# Patient Record
Sex: Male | Born: 1980
Health system: Southern US, Community
[De-identification: ages and names within clinical notes are randomized; demographics above are authoritative.]

## PROBLEM LIST (undated history)

## (undated) DIAGNOSIS — K219 Gastro-esophageal reflux disease without esophagitis: Secondary | ICD-10-CM

## (undated) DIAGNOSIS — Z9889 Other specified postprocedural states: Secondary | ICD-10-CM

## (undated) DIAGNOSIS — F419 Anxiety disorder, unspecified: Secondary | ICD-10-CM

## (undated) DIAGNOSIS — R112 Nausea with vomiting, unspecified: Secondary | ICD-10-CM

## (undated) HISTORY — PX: ANTERIOR CRUCIATE LIGAMENT REPAIR: SHX115

## (undated) HISTORY — PX: MENISCUS REPAIR: SHX5179

## (undated) HISTORY — DX: Anxiety disorder, unspecified: F41.9

## (undated) HISTORY — PX: ESOPHAGOGASTRODUODENOSCOPY: SHX1529

## (undated) HISTORY — PX: HERNIA REPAIR: SHX51

---

## 2008-07-02 ENCOUNTER — Encounter: Admission: RE | Admit: 2008-07-02 | Discharge: 2008-07-02 | Payer: Self-pay | Admitting: Orthopaedic Surgery

## 2008-11-22 ENCOUNTER — Encounter: Admission: RE | Admit: 2008-11-22 | Discharge: 2008-11-22 | Payer: Self-pay | Admitting: Unknown Physician Specialty

## 2009-09-23 ENCOUNTER — Encounter: Admission: RE | Admit: 2009-09-23 | Discharge: 2009-09-23 | Payer: Self-pay | Admitting: Unknown Physician Specialty

## 2013-01-12 ENCOUNTER — Other Ambulatory Visit: Payer: Self-pay | Admitting: Unknown Physician Specialty

## 2013-01-12 ENCOUNTER — Ambulatory Visit
Admission: RE | Admit: 2013-01-12 | Discharge: 2013-01-12 | Disposition: A | Discharge status: Home / Self Care | Payer: 59 | Source: Ambulatory Visit | Attending: Unknown Physician Specialty | Admitting: Unknown Physician Specialty

## 2013-01-12 DIAGNOSIS — M79671 Pain in right foot: Secondary | ICD-10-CM

## 2013-01-16 ENCOUNTER — Telehealth: Payer: Self-pay

## 2013-01-16 NOTE — Telephone Encounter (Signed)
Zollie Scale wants to know how long the patient should be in the boot. Saki Legore,CMA

## 2013-01-16 NOTE — Telephone Encounter (Signed)
I looked at the x-rays myself, probably 2 weeks in the cast boot, keep it wrapped with an Ace bandage, ice 20 minutes 3 or 4 times per day, when he comes out of the boot a simple lace up ankle brace should be sufficient, he should come out of the boot every night to trace out the alphabet with his toes to maintain range of motion and prevent atrophy. This will likely take 4 weeks to heal, as small as the avulsion fracture is, this can be treated like a severe sprain.

## 2013-01-19 ENCOUNTER — Encounter: Payer: Self-pay | Admitting: Sports Medicine

## 2013-01-19 ENCOUNTER — Ambulatory Visit (INDEPENDENT_AMBULATORY_CARE_PROVIDER_SITE_OTHER): Payer: 59 | Admitting: Sports Medicine

## 2013-01-19 VITALS — BP 113/52 | HR 78 | Wt 195.0 lb

## 2013-01-19 DIAGNOSIS — M79671 Pain in right foot: Secondary | ICD-10-CM | POA: Insufficient documentation

## 2013-01-19 DIAGNOSIS — M79609 Pain in unspecified limb: Secondary | ICD-10-CM

## 2013-01-19 NOTE — Assessment & Plan Note (Signed)
Localized under the arch, present for 3 weeks after stepping on a stone while hiking. I cannot isolate the pain to the flexor hallucis longus or flexor digitorum longus tendons. I have recommended he do Celebrex twice a day, I'd like to see him back for custom orthotics for additional support. Then we will touch base in one month and we can consider an MRI if he is continuing to have pain.

## 2013-01-19 NOTE — Progress Notes (Signed)
   Subjective:    I'm seeing this patient as a consultation for:  Dr. Harl Bowie.  CC: Right foot pain  HPI: This is a very pleasant 32 year old male, he is the husband of Joshua Dicker, FNP and son of Dr. Harl Bowie.  Approximately 3 weeks ago he was hiking, stepped on a stone that caused significant pain but no swelling or bruising just under his arch. Since then it improved significantly, but unfortunately he continues to have pain he localizes on the plantar aspect of the navicular, and the plantar fascia. Pain is moderate, persistent. No radiation. He denies any bruising, pain is only present after a long day on his feet. He did have x-rays done that will be dictated below. He has been taking 200 mg of Celebrex which is only moderately effective. He does have a challenge coming up, but is thinking about skipping it and doing the next one due to his pain.  Past medical history, Surgical history, Family history not pertinant except as noted below, Social history, Allergies, and medications have been entered into the medical record, reviewed, and no changes needed.   Review of Systems: No headache, visual changes, nausea, vomiting, diarrhea, constipation, dizziness, abdominal pain, skin rash, fevers, chills, night sweats, weight loss, swollen lymph nodes, body aches, joint swelling, muscle aches, chest pain, shortness of breath, mood changes, visual or auditory hallucinations.   Objective:   General: Well Developed, well nourished, and in no acute distress.  Neuro/Psych: Alert and oriented x3, extra-ocular muscles intact, able to move all 4 extremities, sensation grossly intact. Skin: Warm and dry, no rashes noted.  Respiratory: Not using accessory muscles, speaking in full sentences, trachea midline.  Cardiovascular: Pulses palpable, no extremity edema. Abdomen: Does not appear distended. Right Foot: No visible erythema or swelling. Range of motion is full in all directions. Strength is 5/5  in all directions. No hallux valgus. No pes cavus or pes planus. No abnormal callus noted. No pain over the navicular prominence, or base of fifth metatarsal. No tenderness to palpation of the calcaneal insertion of plantar fascia. No pain at the Achilles insertion. No pain over the calcaneal bursa. No pain of the retrocalcaneal bursa. No tenderness to palpation over the tarsals, metatarsals, or phalanges. No hallux rigidus or limitus. No tenderness palpation over interphalangeal joints. No pain with compression of the metatarsal heads. Neurovascularly intact distally. 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  X-rays were reviewed personally, there is a small avulsion from the dorsal talus, clinically he has no tenderness in this location and this is likely old and insignificant.  Impression and Recommendations:   This case required medical decision making of moderate complexity.

## 2013-01-22 ENCOUNTER — Encounter: Payer: Self-pay | Admitting: Sports Medicine

## 2013-01-22 ENCOUNTER — Ambulatory Visit (INDEPENDENT_AMBULATORY_CARE_PROVIDER_SITE_OTHER): Payer: 59 | Admitting: Sports Medicine

## 2013-01-22 VITALS — BP 133/74 | HR 54 | Wt 198.0 lb

## 2013-01-22 DIAGNOSIS — M79671 Pain in right foot: Secondary | ICD-10-CM

## 2013-01-22 DIAGNOSIS — M79609 Pain in unspecified limb: Secondary | ICD-10-CM

## 2013-01-22 NOTE — Progress Notes (Signed)
    Patient was fitted for a : standard, cushioned, semi-rigid orthotic. The orthotic was heated and afterward the patient stood on the orthotic blank positioned on the orthotic stand. The patient was positioned in subtalar neutral position and 10 degrees of ankle dorsiflexion in a weight bearing stance. After completion of molding, a stable base was applied to the orthotic blank. The blank was ground to a stable position for weight bearing. Size: 12 Base: Blue EVA Additional Posting and Padding: None The patient ambulated these, and they were very comfortable.  I spent 40 minutes with this patient, greater than 50% was face-to-face time counseling regarding the below diagnosis.   

## 2013-01-22 NOTE — Assessment & Plan Note (Signed)
Orthotics as above. Return in 3 weeks. He may desire a second pair, and can come see me as needed for that.

## 2014-05-20 ENCOUNTER — Ambulatory Visit
Admission: RE | Admit: 2014-05-20 | Discharge: 2014-05-20 | Disposition: A | Discharge status: Home / Self Care | Payer: 59 | Source: Ambulatory Visit | Attending: Family Medicine | Admitting: Family Medicine

## 2014-05-20 ENCOUNTER — Other Ambulatory Visit: Payer: Self-pay | Admitting: Family Medicine

## 2014-05-20 DIAGNOSIS — M7989 Other specified soft tissue disorders: Secondary | ICD-10-CM

## 2015-02-27 ENCOUNTER — Encounter: Payer: Self-pay | Admitting: Sports Medicine

## 2015-02-27 ENCOUNTER — Ambulatory Visit (INDEPENDENT_AMBULATORY_CARE_PROVIDER_SITE_OTHER): Payer: 59 | Admitting: Sports Medicine

## 2015-02-27 ENCOUNTER — Ambulatory Visit (INDEPENDENT_AMBULATORY_CARE_PROVIDER_SITE_OTHER): Payer: 59

## 2015-02-27 VITALS — BP 127/54 | HR 60 | Ht 68.0 in | Wt 185.0 lb

## 2015-02-27 DIAGNOSIS — M25562 Pain in left knee: Secondary | ICD-10-CM | POA: Diagnosis not present

## 2015-02-27 DIAGNOSIS — M25561 Pain in right knee: Secondary | ICD-10-CM | POA: Diagnosis not present

## 2015-02-27 DIAGNOSIS — Z9889 Other specified postprocedural states: Secondary | ICD-10-CM | POA: Diagnosis not present

## 2015-02-27 DIAGNOSIS — S83206A Unspecified tear of unspecified meniscus, current injury, right knee, initial encounter: Secondary | ICD-10-CM | POA: Insufficient documentation

## 2015-02-27 DIAGNOSIS — M174 Other bilateral secondary osteoarthritis of knee: Secondary | ICD-10-CM | POA: Insufficient documentation

## 2015-02-27 NOTE — Progress Notes (Signed)
  Subjective:    CC: Locked left knee  HPI: This is a pleasant 34 year male with a history of a anterior cruciate ligament reconstruction, and multiple meniscectomies as well as meniscal repairs, particularly on the left side. Recently he was playing basketball, and felt a catch and had immediate pain and swelling in his left knee. His knee then locked, losing approximately 10 of extension. Exquisite pain with attempts to bring it past 10.  Past medical history, Surgical history, Family history not pertinant except as noted below, Social history, Allergies, and medications have been entered into the medical record, reviewed, and no changes needed.   Review of Systems: No fevers, chills, night sweats, weight loss, chest pain, or shortness of breath.   Objective:    General: Well Developed, well nourished, and in no acute distress.  Neuro: Alert and oriented x3, extra-ocular muscles intact, sensation grossly intact.  HEENT: Normocephalic, atraumatic, pupils equal round reactive to light, neck supple, no masses, no lymphadenopathy, thyroid nonpalpable.  Skin: Warm and dry, no rashes. Cardiac: Regular rate and rhythm, no murmurs rubs or gallops, no lower extremity edema.  Respiratory: Clear to auscultation bilaterally. Not using accessory muscles, speaking in full sentences. Left Knee: Minimally swollen, lax approximately 10 of terminal extension with severe pain. Ligaments with solid consistent endpoints including ACL, PCL, LCL, MCL. McMurray's is positive at extension Non painful patellar compression. Patellar and quadriceps tendons unremarkable. Hamstring and quadriceps strength is normal.  Procedure: Real-time Ultrasound Guided Injection of left knee Device: GE Logiq E  Verbal informed consent obtained.  Time-out conducted.  Noted no overlying erythema, induration, or other signs of local infection.  Skin prepped in a sterile fashion.  Local anesthesia: Topical Ethyl chloride.    With sterile technique and under real time ultrasound guidance:  Noted mild effusion, 2 mL kenalog 40, 4 mL lidocaine injected easily Completed without difficulty  Pain immediately resolved suggesting accurate placement of the medication.  Advised to call if fevers/chills, erythema, induration, drainage, or persistent bleeding.  Images permanently stored and available for review in the ultrasound unit.  Impression: Technically successful ultrasound guided injection.  Procedure:  Locked left knee reduction Risks, benefits, and alternatives explained and consent obtained. Time out conducted. Intra-articular injection performed as above.  Adequate anesthesia ensured. Applying axial traction and varus stress to unload the lateral compartment on the knee I gently brought into full extension, thus relieving the lock Pt stable, aftercare and follow-up advised.  Impression and Recommendations:   I spent 40 minutes with this patient, greater than 50% was face-to-face time counseling regarding the above diagnoses, this was separate from the time spent during the procedures

## 2015-02-27 NOTE — Assessment & Plan Note (Signed)
At this point I do suspect a displaced bucket handle tear versus intra-articular loose body, joint injection with manipulation to attempt to free the knee, he is unable to extend past approximately 10. We are also going to order an MRI for Monday.

## 2015-03-03 ENCOUNTER — Ambulatory Visit (INDEPENDENT_AMBULATORY_CARE_PROVIDER_SITE_OTHER): Payer: 59

## 2015-03-03 DIAGNOSIS — Z9889 Other specified postprocedural states: Secondary | ICD-10-CM | POA: Diagnosis not present

## 2015-03-03 DIAGNOSIS — M9429 Chondromalacia, multiple sites: Secondary | ICD-10-CM

## 2015-03-03 DIAGNOSIS — M25562 Pain in left knee: Secondary | ICD-10-CM

## 2015-04-01 ENCOUNTER — Encounter: Payer: Self-pay | Admitting: Sports Medicine

## 2015-04-01 ENCOUNTER — Ambulatory Visit (INDEPENDENT_AMBULATORY_CARE_PROVIDER_SITE_OTHER): Payer: 59 | Admitting: Sports Medicine

## 2015-04-01 VITALS — BP 133/63 | HR 60 | Wt 184.0 lb

## 2015-04-01 DIAGNOSIS — M25562 Pain in left knee: Secondary | ICD-10-CM | POA: Diagnosis not present

## 2015-04-01 NOTE — Assessment & Plan Note (Signed)
MRI did show meniscal tearing, anterior cruciate ligament reconstruction, and grade 4 chondromalacia. He did very well after unlocking his knee under local anesthesia at the last visit. Has been playing basketball without any problems. He wants another reaction knee brace for his right knee. Return to see me on an as-needed basis. 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

## 2015-04-01 NOTE — Progress Notes (Signed)
  Subjective:    CC: Follow-up  HPI:  One month ago I unlocked this pleasant 34 year old male knee, under local anesthesia. He returns today doing much better, he's been point basketball and is happy with results. He does desire an additional reaction knee brace for his contralateral knee.  Past medical history, Surgical history, Family history not pertinant except as noted below, Social history, Allergies, and medications have been entered into the medical record, reviewed, and no changes needed.   Review of Systems: No fevers, chills, night sweats, weight loss, chest pain, or shortness of breath.   Objective:    General: Well Developed, well nourished, and in no acute distress.  Neuro: Alert and oriented x3, extra-ocular muscles intact, sensation grossly intact.  HEENT: Normocephalic, atraumatic, pupils equal round reactive to light, neck supple, no masses, no lymphadenopathy, thyroid nonpalpable.  Skin: Warm and dry, no rashes. Cardiac: Regular rate and rhythm, no murmurs rubs or gallops, no lower extremity edema.  Respiratory: Clear to auscultation bilaterally. Not using accessory muscles, speaking in full sentences. Left Knee: Normal to inspection with no erythema or effusion or obvious bony abnormalities. Palpation normal with no warmth or joint line tenderness or patellar tenderness or condyle tenderness. ROM normal in flexion and extension and lower leg rotation. Ligaments with solid consistent endpoints including ACL, PCL, LCL, MCL. Negative Mcmurray's and provocative meniscal tests. Non painful patellar compression. Patellar and quadriceps tendons unremarkable. Hamstring and quadriceps strength is normal.  Impression and Recommendations:

## 2016-07-09 ENCOUNTER — Ambulatory Visit (INDEPENDENT_AMBULATORY_CARE_PROVIDER_SITE_OTHER): Payer: 59 | Admitting: Sports Medicine

## 2016-07-09 DIAGNOSIS — M2392 Unspecified internal derangement of left knee: Secondary | ICD-10-CM

## 2016-07-09 NOTE — Assessment & Plan Note (Signed)
Anesthetic and steroid injection. I was able to reduce what seems to be a meniscal tear, and unlock his knee. Return to see me as needed, does have an anterior cruciate ligament reconstruction with some osteoarthritis and history of a meniscectomy, if he does have locking of the knee again I have asked him to call me so that we can get an MRI while the knee is locked before unlocking. Certainly viscosupplementation will be an option if pain becomes persistent.

## 2016-07-09 NOTE — Progress Notes (Signed)
  Subjective:    CC: Locked left knee  HPI: This is a pleasant 36 year old male, he has a history of a left anterior cruciate ligament reconstruction with history of partial meniscectomy, he has had a lot knee since his meniscectomy, we were able to reduce it in the office under local anesthesia. Unfortunately he has developed another locked knee, not extend past 10 without great pain.  Past medical history:  Negative.  See flowsheet/record as well for more information.  Surgical history: Negative.  See flowsheet/record as well for more information.  Family history: Negative.  See flowsheet/record as well for more information.  Social history: Negative.  See flowsheet/record as well for more information.  Allergies, and medications have been entered into the medical record, reviewed, and no changes needed.   Review of Systems: No fevers, chills, night sweats, weight loss, chest pain, or shortness of breath.   Objective:    General: Well Developed, well nourished, and in no acute distress.  Neuro: Alert and oriented x3, extra-ocular muscles intact, sensation grossly intact.  HEENT: Normocephalic, atraumatic, pupils equal round reactive to light, neck supple, no masses, no lymphadenopathy, thyroid nonpalpable.  Skin: Warm and dry, no rashes. Cardiac: Regular rate and rhythm, no murmurs rubs or gallops, no lower extremity edema.  Respiratory: Clear to auscultation bilaterally. Not using accessory muscles, speaking in full sentences. Left knee: Tender to palpation at the anterolateral joint line, unable to extend past 10, has severe pain and mechanical obstruction.  Procedure: Real-time Ultrasound Guided Injection of left knee Device: GE Logiq E  Verbal informed consent obtained.  Time-out conducted.  Noted no overlying erythema, induration, or other signs of local infection.  Skin prepped in a sterile fashion.  Local anesthesia: Topical Ethyl chloride.  With sterile technique and under  real time ultrasound guidance:  1 mL kenalog 40, 2 mL lidocaine, 2 mL Marcaine injected easily Completed without difficulty  Pain immediately resolved suggesting accurate placement of the medication.  Advised to call if fevers/chills, erythema, induration, drainage, or persistent bleeding.  Images permanently stored and available for review in the ultrasound unit.  Impression: Technically successful ultrasound guided injection.  After local anesthesia I was able to apply a varus directed force, internally rotate the foot and gently unlock his knee after hearing and feeling a palpable pop.  Impression and Recommendations:    Locked knee, left Anesthetic and steroid injection. I was able to reduce what seems to be a meniscal tear, and unlock his knee. Return to see me as needed, does have an anterior cruciate ligament reconstruction with some osteoarthritis and history of a meniscectomy, if he does have locking of the knee again I have asked him to call me so that we can get an MRI while the knee is locked before unlocking. Certainly viscosupplementation will be an option if pain becomes persistent.

## 2016-12-23 ENCOUNTER — Ambulatory Visit (INDEPENDENT_AMBULATORY_CARE_PROVIDER_SITE_OTHER): Payer: 59 | Admitting: Sports Medicine

## 2016-12-23 DIAGNOSIS — Z0000 Encounter for general adult medical examination without abnormal findings: Secondary | ICD-10-CM | POA: Insufficient documentation

## 2016-12-23 LAB — CBC
HCT: 47.4 % (ref 38.5–50.0)
Hemoglobin: 16.6 g/dL (ref 13.2–17.1)
MCH: 32.9 pg (ref 27.0–33.0)
MCHC: 35 g/dL (ref 32.0–36.0)
MCV: 94 fL (ref 80.0–100.0)
MPV: 10.3 fL (ref 7.5–12.5)
Platelets: 164 K/uL (ref 140–400)
RBC: 5.04 MIL/uL (ref 4.20–5.80)
RDW: 12.9 % (ref 11.0–15.0)
WBC: 5.9 K/uL (ref 3.8–10.8)

## 2016-12-23 NOTE — Progress Notes (Signed)
  Subjective:    CC: Physical exam  HPI:  Joshua Gaines is a healthy 36 year old male, he works with [employer].  He has no complaint, just needs a physical in some blood work.  He does have a meniscal tear, we unlocked this sometime ago under local anesthesia, continues to do well.  Past medical history:  Negative.  See flowsheet/record as well for more information.  Surgical history: Negative.  See flowsheet/record as well for more information.  Family history: Negative.  See flowsheet/record as well for more information.  Social history: Negative.  See flowsheet/record as well for more information.  Allergies, and medications have been entered into the medical record, reviewed, and no changes needed.    Review of Systems: No headache, visual changes, nausea, vomiting, diarrhea, constipation, dizziness, abdominal pain, skin rash, fevers, chills, night sweats, swollen lymph nodes, weight loss, chest pain, body aches, joint swelling, muscle aches, shortness of breath, mood changes, visual or auditory hallucinations.  Objective:    General: Well Developed, well nourished, and in no acute distress.  Neuro: Alert and oriented x3, extra-ocular muscles intact, sensation grossly intact. Cranial nerves II through XII are intact, motor, sensory, and coordinative functions are all intact. HEENT: Normocephalic, atraumatic, pupils equal round reactive to light, neck supple, no masses, no lymphadenopathy, thyroid nonpalpable. Oropharynx, nasopharynx, external ear canals are unremarkable. Skin: Warm and dry, no rashes noted.  Cardiac: Regular rate and rhythm, no murmurs rubs or gallops.  Respiratory: Clear to auscultation bilaterally. Not using accessory muscles, speaking in full sentences.  Abdominal: Soft, nontender, nondistended, positive bowel sounds, no masses, no organomegaly.  Musculoskeletal: Shoulder, elbow, wrist, hip, knee, ankle stable, and with full range of motion.  Impression and  Recommendations:    The patient was counselled, risk factors were discussed, anticipatory guidance given.  Annual physical exam CPE today is unremarkable. Fasting now. Labs ordered.

## 2016-12-23 NOTE — Assessment & Plan Note (Signed)
CPE today is unremarkable. Fasting now. Labs ordered.

## 2016-12-24 LAB — COMPREHENSIVE METABOLIC PANEL WITH GFR
ALT: 16 U/L (ref 9–46)
Alkaline Phosphatase: 58 U/L (ref 40–115)
BUN: 21 mg/dL (ref 7–25)
Chloride: 102 mmol/L (ref 98–110)
Creat: 1.06 mg/dL (ref 0.60–1.35)
Glucose, Bld: 88 mg/dL (ref 65–99)
Sodium: 141 mmol/L (ref 135–146)
Total Protein: 7 g/dL (ref 6.1–8.1)

## 2016-12-24 LAB — COMPREHENSIVE METABOLIC PANEL
AST: 18 U/L (ref 10–40)
Albumin: 5 g/dL (ref 3.6–5.1)
CO2: 22 mmol/L (ref 20–32)
Calcium: 9.8 mg/dL (ref 8.6–10.3)
Potassium: 4.2 mmol/L (ref 3.5–5.3)
Total Bilirubin: 0.9 mg/dL (ref 0.2–1.2)

## 2016-12-24 LAB — LIPID PANEL W/REFLEX DIRECT LDL
Cholesterol: 175 mg/dL (ref <200)
HDL: 51 mg/dL (ref >40)
LDL-Cholesterol: 107 mg/dL — ABNORMAL HIGH
Non-HDL Cholesterol (Calc): 124 mg/dL (ref <130)
Total CHOL/HDL Ratio: 3.4 Ratio (ref <5.0)
Triglycerides: 77 mg/dL (ref <150)

## 2016-12-24 LAB — TSH: TSH: 1.76 m[IU]/L (ref 0.40–4.50)

## 2016-12-24 LAB — VITAMIN D 25 HYDROXY (VIT D DEFICIENCY, FRACTURES): Vit D, 25-Hydroxy: 35 ng/mL (ref 30–100)

## 2016-12-24 LAB — HEMOGLOBIN A1C
Hgb A1c MFr Bld: 4.8 % (ref <5.7)
Mean Plasma Glucose: 91 mg/dL

## 2016-12-24 LAB — HIV ANTIBODY (ROUTINE TESTING W REFLEX): HIV 1&2 Ab, 4th Generation: NONREACTIVE

## 2017-05-31 DIAGNOSIS — R1013 Epigastric pain: Secondary | ICD-10-CM | POA: Diagnosis not present

## 2017-05-31 DIAGNOSIS — K210 Gastro-esophageal reflux disease with esophagitis: Secondary | ICD-10-CM | POA: Diagnosis not present

## 2017-06-03 MED FILL — PANTOPRAZOLE SOD DR 40 MG T: 40 | 90 days supply | Qty: 90 | Fill #0

## 2017-06-07 DIAGNOSIS — B9681 Helicobacter pylori [H. pylori] as the cause of diseases classified elsewhere: Secondary | ICD-10-CM | POA: Diagnosis not present

## 2017-06-07 DIAGNOSIS — K2950 Unspecified chronic gastritis without bleeding: Secondary | ICD-10-CM | POA: Diagnosis not present

## 2017-06-07 DIAGNOSIS — K449 Diaphragmatic hernia without obstruction or gangrene: Secondary | ICD-10-CM | POA: Diagnosis not present

## 2017-06-07 DIAGNOSIS — K2930 Chronic superficial gastritis without bleeding: Secondary | ICD-10-CM | POA: Diagnosis not present

## 2017-07-15 DIAGNOSIS — K210 Gastro-esophageal reflux disease with esophagitis: Secondary | ICD-10-CM | POA: Diagnosis not present

## 2017-07-15 DIAGNOSIS — A048 Other specified bacterial intestinal infections: Secondary | ICD-10-CM | POA: Diagnosis not present

## 2017-07-15 MED FILL — AMOXICILLIN 500 MG CAPSULE: 500 | 10 days supply | Qty: 40 | Fill #0

## 2017-07-15 MED FILL — CLARITHROMYCIN 500 MG TAB: 500 | 10 days supply | Qty: 20 | Fill #0

## 2017-07-15 MED FILL — LANSOPRAZOLE DR 30 MG CAPSU: 30 | 10 days supply | Qty: 20 | Fill #0

## 2017-12-28 ENCOUNTER — Encounter: Payer: Self-pay | Admitting: Sports Medicine

## 2017-12-28 ENCOUNTER — Ambulatory Visit (INDEPENDENT_AMBULATORY_CARE_PROVIDER_SITE_OTHER): Payer: 59 | Admitting: Sports Medicine

## 2017-12-28 VITALS — BP 113/65 | HR 62 | Ht 73.0 in | Wt 189.0 lb

## 2017-12-28 DIAGNOSIS — M25561 Pain in right knee: Secondary | ICD-10-CM

## 2017-12-28 DIAGNOSIS — S83206S Unspecified tear of unspecified meniscus, current injury, right knee, sequela: Secondary | ICD-10-CM | POA: Diagnosis not present

## 2017-12-28 DIAGNOSIS — Z0000 Encounter for general adult medical examination without abnormal findings: Secondary | ICD-10-CM | POA: Diagnosis not present

## 2017-12-28 DIAGNOSIS — Z23 Encounter for immunization: Secondary | ICD-10-CM | POA: Diagnosis not present

## 2017-12-28 NOTE — Assessment & Plan Note (Signed)
Annual physical exam as above, routine labs ordered.

## 2017-12-28 NOTE — Progress Notes (Signed)
Subjective:    CC: Annual physical exam  HPI:  Joshua Gaines is here, he has no complaints with the exception of his right knee.  History of ACL reconstruction, bucket-handle meniscal tear with attempted repair.  He has continued to have instability without pain in his right knee, occasional locking.  Symptoms are moderate, persistent.  I reviewed the past medical history, family history, social history, surgical history, and allergies today and no changes were needed.  Please see the problem list section below in epic for further details.  Past Medical History: No past medical history on file. Past Surgical History: Past Surgical History:  Procedure Laterality Date  . ANTERIOR CRUCIATE LIGAMENT REPAIR  [id number]  . MENISCUS REPAIR     left knee   Social History: Social History   Socioeconomic History  . Marital status: Married    Spouse name: Not on file  . Number of children: Not on file  . Years of education: Not on file  . Highest education level: Not on file  Occupational History  . Not on file  Social Needs  . Financial resource strain: Not on file  . Food insecurity:    Worry: Not on file    Inability: Not on file  . Transportation needs:    Medical: Not on file    Non-medical: Not on file  Tobacco Use  . Smoking status: Never Smoker  . Smokeless tobacco: Never Used  Substance and Sexual Activity  . Alcohol use: No  . Drug use: No  . Sexual activity: Yes    Partners: Female  Lifestyle  . Physical activity:    Days per week: Not on file    Minutes per session: Not on file  . Stress: Not on file  Relationships  . Social connections:    Talks on phone: Not on file    Gets together: Not on file    Attends religious service: Not on file    Active member of club or organization: Not on file    Attends meetings of clubs or organizations: Not on file    Relationship status: Not on file  Other Topics Concern  . Not on file  Social History Narrative  . Not on file    Family History: No family history on file. Allergies: No Known Allergies Medications: See med rec.  Review of Systems: No headache, visual changes, nausea, vomiting, diarrhea, constipation, dizziness, abdominal pain, skin rash, fevers, chills, night sweats, swollen lymph nodes, weight loss, chest pain, body aches, joint swelling, muscle aches, shortness of breath, mood changes, visual or auditory hallucinations.  Objective:    General: Well Developed, well nourished, and in no acute distress.  Neuro: Alert and oriented x3, extra-ocular muscles intact, sensation grossly intact. Cranial nerves II through XII are intact, motor, sensory, and coordinative functions are all intact. HEENT: Normocephalic, atraumatic, pupils equal round reactive to light, neck supple, no masses, no lymphadenopathy, thyroid nonpalpable. Oropharynx, nasopharynx, external ear canals are unremarkable. Skin: Warm and dry, no rashes noted.  Cardiac: Regular rate and rhythm, no murmurs rubs or gallops.  Respiratory: Clear to auscultation bilaterally. Not using accessory muscles, speaking in full sentences.  Abdominal: Soft, nontender, nondistended, positive bowel sounds, no masses, no organomegaly.  Musculoskeletal: Shoulder, elbow, wrist, hip, knee, ankle stable, and with full range of motion.  Impression and Recommendations:    The patient was counselled, risk factors were discussed, anticipatory guidance given.  Acute meniscal tear of right knee Historical bucket-handle tear of the meniscus, ACL  reconstruction, I have done several manipulations to unlock his knee over time. He did have an attempted repair some years back. Persistent pain, persistent locking. Adding an updated MRI, discussed with Dr. Ophelia Charter, no arthrogram needed. Referral to Dr. Griffin Basil, he is going to need an arthroscopy with likely partial meniscectomy. Because he does have some osteoarthritis in the knee it is possible that arthroplasty  would be the next step, he is aware of this.  Annual physical exam Annual physical exam as above, routine labs ordered. ___________________________________________ Joshua Gaines, M.D., ABFM., CAQSM. Primary Care and [employer] In[employer] of [employer] [facility] [employer]

## 2017-12-28 NOTE — Assessment & Plan Note (Addendum)
Historical bucket-handle tear of the meniscus, ACL reconstruction, I have done several manipulations to unlock his knee over time. He did have an attempted repair some years back. Persistent pain, persistent locking. Adding an updated MRI, discussed with Dr. Ophelia Charter, no arthrogram needed. Referral to Dr. Griffin Basil, he is going to need an arthroscopy with likely partial meniscectomy. Because he does have some osteoarthritis in the knee it is possible that arthroplasty would be the next step, he is aware of this.

## 2017-12-29 DIAGNOSIS — M25561 Pain in right knee: Secondary | ICD-10-CM | POA: Diagnosis not present

## 2017-12-29 LAB — CBC
HCT: 45.9 % (ref 38.5–50.0)
Hemoglobin: 15.9 g/dL (ref 13.2–17.1)
MCH: 32.3 pg (ref 27.0–33.0)
MCHC: 34.6 g/dL (ref 32.0–36.0)
MCV: 93.1 fL (ref 80.0–100.0)
MPV: 10.3 fL (ref 7.5–12.5)
Platelets: 143 Thousand/uL (ref 140–400)
RBC: 4.93 Million/uL (ref 4.20–5.80)
RDW: 12 % (ref 11.0–15.0)
WBC: 4.4 Thousand/uL (ref 3.8–10.8)

## 2017-12-29 LAB — COMPREHENSIVE METABOLIC PANEL WITH GFR
Albumin: 4.7 g/dL (ref 3.6–5.1)
Chloride: 104 mmol/L (ref 98–110)
Creat: 1.11 mg/dL (ref 0.60–1.35)
Globulin: 2.5 g/dL (ref 1.9–3.7)
Potassium: 4.3 mmol/L (ref 3.5–5.3)
Sodium: 141 mmol/L (ref 135–146)
Total Bilirubin: 1.2 mg/dL (ref 0.2–1.2)
Total Protein: 7.2 g/dL (ref 6.1–8.1)

## 2017-12-29 LAB — COMPREHENSIVE METABOLIC PANEL
AG Ratio: 1.9 (calc) (ref 1.0–2.5)
ALT: 21 U/L (ref 9–46)
AST: 21 U/L (ref 10–40)
Alkaline phosphatase (APISO): 59 U/L (ref 40–115)
BUN: 16 mg/dL (ref 7–25)
CO2: 28 mmol/L (ref 20–32)
Calcium: 9.8 mg/dL (ref 8.6–10.3)
Glucose, Bld: 83 mg/dL (ref 65–99)

## 2017-12-29 LAB — LIPID PANEL W/REFLEX DIRECT LDL
Cholesterol: 170 mg/dL (ref <200)
HDL: 50 mg/dL (ref >40)
LDL Cholesterol (Calc): 105 mg/dL — ABNORMAL HIGH
Non-HDL Cholesterol (Calc): 120 mg/dL (calc) (ref <130)
Total CHOL/HDL Ratio: 3.4 (calc) (ref <5.0)
Triglycerides: 67 mg/dL (ref <150)

## 2017-12-29 LAB — HEMOGLOBIN A1C
Hgb A1c MFr Bld: 4.9 %{Hb} (ref <5.7)
Mean Plasma Glucose: 94 (calc)
eAG (mmol/L): 5.2 (calc)

## 2017-12-29 LAB — VITAMIN D 25 HYDROXY (VIT D DEFICIENCY, FRACTURES): Vit D, 25-Hydroxy: 37 ng/mL (ref 30–100)

## 2017-12-29 LAB — TSH: TSH: 1.6 m[IU]/L (ref 0.40–4.50)

## 2018-01-03 ENCOUNTER — Ambulatory Visit (INDEPENDENT_AMBULATORY_CARE_PROVIDER_SITE_OTHER): Payer: 59

## 2018-01-03 DIAGNOSIS — M25561 Pain in right knee: Secondary | ICD-10-CM

## 2018-01-03 DIAGNOSIS — M23321 Other meniscus derangements, posterior horn of medial meniscus, right knee: Secondary | ICD-10-CM | POA: Diagnosis not present

## 2018-01-03 DIAGNOSIS — S83241A Other tear of medial meniscus, current injury, right knee, initial encounter: Secondary | ICD-10-CM | POA: Diagnosis not present

## 2018-01-03 DIAGNOSIS — X58XXXA Exposure to other specified factors, initial encounter: Secondary | ICD-10-CM

## 2018-01-03 DIAGNOSIS — S83206S Unspecified tear of unspecified meniscus, current injury, right knee, sequela: Secondary | ICD-10-CM

## 2018-01-05 DIAGNOSIS — M25561 Pain in right knee: Secondary | ICD-10-CM | POA: Diagnosis not present

## 2018-01-25 ENCOUNTER — Encounter (HOSPITAL_BASED_OUTPATIENT_CLINIC_OR_DEPARTMENT_OTHER): Payer: Self-pay | Admitting: *Deleted

## 2018-01-25 ENCOUNTER — Other Ambulatory Visit: Payer: Self-pay

## 2018-02-01 ENCOUNTER — Ambulatory Visit (HOSPITAL_BASED_OUTPATIENT_CLINIC_OR_DEPARTMENT_OTHER): Payer: 59 | Admitting: Anesthesiology

## 2018-02-01 ENCOUNTER — Encounter (HOSPITAL_BASED_OUTPATIENT_CLINIC_OR_DEPARTMENT_OTHER)
Admission: RE | Disposition: A | Discharge status: Home / Self Care | Payer: Self-pay | Source: Ambulatory Visit | Attending: Orthopaedic Surgery

## 2018-02-01 ENCOUNTER — Encounter (HOSPITAL_BASED_OUTPATIENT_CLINIC_OR_DEPARTMENT_OTHER): Payer: Self-pay | Admitting: *Deleted

## 2018-02-01 ENCOUNTER — Other Ambulatory Visit: Payer: Self-pay

## 2018-02-01 ENCOUNTER — Ambulatory Visit (HOSPITAL_BASED_OUTPATIENT_CLINIC_OR_DEPARTMENT_OTHER)
Admission: RE | Admit: 2018-02-01 | Discharge: 2018-02-01 | Disposition: A | Discharge status: Home / Self Care | Payer: 59 | Source: Ambulatory Visit | Attending: Orthopaedic Surgery | Admitting: Orthopaedic Surgery

## 2018-02-01 DIAGNOSIS — X58XXXA Exposure to other specified factors, initial encounter: Secondary | ICD-10-CM | POA: Diagnosis not present

## 2018-02-01 DIAGNOSIS — S83281A Other tear of lateral meniscus, current injury, right knee, initial encounter: Secondary | ICD-10-CM | POA: Insufficient documentation

## 2018-02-01 DIAGNOSIS — M2341 Loose body in knee, right knee: Secondary | ICD-10-CM | POA: Diagnosis not present

## 2018-02-01 DIAGNOSIS — Y832 Surgical operation with anastomosis, bypass or graft as the cause of abnormal reaction of the patient, or of later complication, without mention of misadventure at the time of the procedure: Secondary | ICD-10-CM | POA: Diagnosis not present

## 2018-02-01 DIAGNOSIS — S83241A Other tear of medial meniscus, current injury, right knee, initial encounter: Secondary | ICD-10-CM | POA: Insufficient documentation

## 2018-02-01 DIAGNOSIS — M2241 Chondromalacia patellae, right knee: Secondary | ICD-10-CM | POA: Diagnosis not present

## 2018-02-01 DIAGNOSIS — T84490A Other mechanical complication of muscle and tendon graft, initial encounter: Secondary | ICD-10-CM | POA: Diagnosis not present

## 2018-02-01 DIAGNOSIS — S83511A Sprain of anterior cruciate ligament of right knee, initial encounter: Secondary | ICD-10-CM | POA: Diagnosis not present

## 2018-02-01 DIAGNOSIS — G8918 Other acute postprocedural pain: Secondary | ICD-10-CM | POA: Diagnosis not present

## 2018-02-01 HISTORY — PX: KNEE ARTHROSCOPY WITH MEDIAL MENISECTOMY: SHX5651

## 2018-02-01 HISTORY — DX: Gastro-esophageal reflux disease without esophagitis: K21.9

## 2018-02-01 HISTORY — PX: CHONDROPLASTY: SHX5177

## 2018-02-01 HISTORY — PX: KNEE ARTHROSCOPY WITH ANTERIOR CRUCIATE LIGAMENT (ACL) REPAIR WITH HAMSTRING GRAFT: SHX5645

## 2018-02-01 HISTORY — PX: KNEE ARTHROSCOPY WITH LATERAL MENISECTOMY: SHX6193

## 2018-02-01 HISTORY — PX: HARDWARE REMOVAL: SHX979

## 2018-02-01 SURGERY — KNEE ARTHROSCOPY WITH ANTERIOR CRUCIATE LIGAMENT (ACL) REPAIR WITH HAMSTRING GRAFT
Anesthesia: General | Site: Knee | Laterality: Right

## 2018-02-01 MED ORDER — OXYCODONE HCL 5 MG PO TABS
5.0000 mg | ORAL_TABLET | Freq: Once | ORAL | Status: AC
  Administered 2018-02-01: 5 mg via ORAL

## 2018-02-01 MED ORDER — LIDOCAINE HCL (CARDIAC) PF 100 MG/5ML IV SOSY
PREFILLED_SYRINGE | INTRAVENOUS | Status: DC | PRN
  Administered 2018-02-01: 80 mg via INTRAVENOUS

## 2018-02-01 MED ORDER — DEXAMETHASONE SODIUM PHOSPHATE 4 MG/ML IJ SOLN
INTRAMUSCULAR | Status: DC | PRN
  Administered 2018-02-01: 10 mg via INTRAVENOUS

## 2018-02-01 MED ORDER — OXYCODONE HCL 5 MG PO TABS
ORAL_TABLET | ORAL | Status: AC
  Filled 2018-02-01: qty 1

## 2018-02-01 MED ORDER — BUPIVACAINE HCL (PF) 0.25 % IJ SOLN
INTRAMUSCULAR | Status: AC
  Filled 2018-02-01: qty 30

## 2018-02-01 MED ORDER — LACTATED RINGERS IV SOLN
INTRAVENOUS | Status: DC
  Administered 2018-02-01 (×2): via INTRAVENOUS

## 2018-02-01 MED ORDER — FENTANYL CITRATE (PF) 100 MCG/2ML IJ SOLN
INTRAMUSCULAR | Status: AC
  Filled 2018-02-01: qty 2

## 2018-02-01 MED ORDER — ONDANSETRON HCL 4 MG PO TABS: 4.0000 mg | ORAL_TABLET | Freq: Three times a day (TID) | ORAL | 1 refills | Status: AC | PRN

## 2018-02-01 MED ORDER — OXYCODONE HCL 5 MG PO TABS: ORAL_TABLET | ORAL | 0 refills | Status: AC

## 2018-02-01 MED ORDER — FENTANYL CITRATE (PF) 100 MCG/2ML IJ SOLN
INTRAMUSCULAR | Status: DC | PRN
  Administered 2018-02-01 (×8): 25 ug via INTRAVENOUS

## 2018-02-01 MED ORDER — SCOPOLAMINE 1 MG/3DAYS TD PT72
1.0000 | MEDICATED_PATCH | Freq: Once | TRANSDERMAL | Status: AC | PRN
  Administered 2018-02-01: 1 via TRANSDERMAL

## 2018-02-01 MED ORDER — SODIUM CHLORIDE 0.9 % IR SOLN
Status: DC | PRN
  Administered 2018-02-01: 09:00:00

## 2018-02-01 MED ORDER — OMEPRAZOLE 20 MG PO CPDR: 20.0000 mg | DELAYED_RELEASE_CAPSULE | Freq: Every day | ORAL | 0 refills | Status: DC

## 2018-02-01 MED ORDER — PROPOFOL 10 MG/ML IV BOLUS
INTRAVENOUS | Status: DC | PRN
  Administered 2018-02-01: 200 mg via INTRAVENOUS

## 2018-02-01 MED ORDER — FENTANYL CITRATE (PF) 100 MCG/2ML IJ SOLN
25.0000 ug | INTRAMUSCULAR | Status: DC | PRN
  Administered 2018-02-01: 50 ug via INTRAVENOUS
  Administered 2018-02-01 (×2): 25 ug via INTRAVENOUS

## 2018-02-01 MED ORDER — ROPIVACAINE HCL 7.5 MG/ML IJ SOLN
INTRAMUSCULAR | Status: DC | PRN
  Administered 2018-02-01: 20 mL via PERINEURAL

## 2018-02-01 MED ORDER — CEFAZOLIN SODIUM-DEXTROSE 2-4 GM/100ML-% IV SOLN
INTRAVENOUS | Status: AC
  Filled 2018-02-01: qty 100

## 2018-02-01 MED ORDER — CEFAZOLIN SODIUM-DEXTROSE 2-4 GM/100ML-% IV SOLN
2.0000 g | INTRAVENOUS | Status: AC
  Administered 2018-02-01: 2 g via INTRAVENOUS

## 2018-02-01 MED ORDER — CHLORHEXIDINE GLUCONATE 4 % EX LIQD: 60.0000 mL | Freq: Once | CUTANEOUS | Status: DC

## 2018-02-01 MED ORDER — MIDAZOLAM HCL 2 MG/2ML IJ SOLN
INTRAMUSCULAR | Status: AC
  Filled 2018-02-01: qty 2

## 2018-02-01 MED ORDER — LIDOCAINE 2% (20 MG/ML) 5 ML SYRINGE
INTRAMUSCULAR | Status: AC
  Filled 2018-02-01: qty 5

## 2018-02-01 MED ORDER — PROPOFOL 500 MG/50ML IV EMUL
INTRAVENOUS | Status: AC
  Filled 2018-02-01: qty 50

## 2018-02-01 MED ORDER — ONDANSETRON HCL 4 MG/2ML IJ SOLN
INTRAMUSCULAR | Status: AC
  Filled 2018-02-01: qty 2

## 2018-02-01 MED ORDER — ONDANSETRON HCL 4 MG/2ML IJ SOLN
INTRAMUSCULAR | Status: DC | PRN
  Administered 2018-02-01: 4 mg via INTRAVENOUS

## 2018-02-01 MED ORDER — DEXAMETHASONE SODIUM PHOSPHATE 10 MG/ML IJ SOLN
INTRAMUSCULAR | Status: AC
  Filled 2018-02-01: qty 1

## 2018-02-01 MED ORDER — MIDAZOLAM HCL 5 MG/5ML IJ SOLN
INTRAMUSCULAR | Status: DC | PRN
  Administered 2018-02-01: 2 mg via INTRAVENOUS

## 2018-02-01 MED ORDER — SCOPOLAMINE 1 MG/3DAYS TD PT72
MEDICATED_PATCH | TRANSDERMAL | Status: AC
  Filled 2018-02-01: qty 1

## 2018-02-01 MED ORDER — ACETAMINOPHEN 500 MG PO TABS: 1000.0000 mg | ORAL_TABLET | Freq: Three times a day (TID) | ORAL | 0 refills | Status: AC

## 2018-02-01 MED ORDER — KETOROLAC TROMETHAMINE 30 MG/ML IJ SOLN
INTRAMUSCULAR | Status: AC
  Filled 2018-02-01: qty 1

## 2018-02-01 MED ORDER — FENTANYL CITRATE (PF) 100 MCG/2ML IJ SOLN
50.0000 ug | INTRAMUSCULAR | Status: DC | PRN
  Administered 2018-02-01: 100 ug via INTRAVENOUS

## 2018-02-01 MED ORDER — KETOROLAC TROMETHAMINE 30 MG/ML IJ SOLN
30.0000 mg | Freq: Once | INTRAMUSCULAR | Status: AC
  Administered 2018-02-01: 30 mg via INTRAVENOUS

## 2018-02-01 MED ORDER — ASPIRIN 81 MG PO TABS: 81.0000 mg | ORAL_TABLET | Freq: Every day | ORAL | 0 refills | Status: AC

## 2018-02-01 MED ORDER — MIDAZOLAM HCL 2 MG/2ML IJ SOLN
1.0000 mg | INTRAMUSCULAR | Status: DC | PRN
  Administered 2018-02-01: 2 mg via INTRAVENOUS

## 2018-02-01 MED ORDER — MELOXICAM 7.5 MG PO TABS: 7.5000 mg | ORAL_TABLET | Freq: Every day | ORAL | 2 refills | Status: DC

## 2018-02-01 MED ORDER — EPINEPHRINE 30 MG/30ML IJ SOLN
INTRAMUSCULAR | Status: AC
  Filled 2018-02-01: qty 1

## 2018-02-01 MED FILL — MELOXICAM 7.5 MG TABLET: 7.5 | 30 days supply | Qty: 30 | Fill #0

## 2018-02-01 MED FILL — ONDANSETRON HCL 4 MG TABLET: 4 | 3 days supply | Qty: 10 | Fill #0

## 2018-02-01 MED FILL — ACETAMINOPHEN 500 MG TABS: 500 | 17 days supply | Qty: 100 | Fill #0

## 2018-02-01 MED FILL — oxyCODONE HCL 5 MG TABS: 5 | 3 days supply | Qty: 30 | Fill #0

## 2018-02-01 MED FILL — OMEPRAZOLE 20 MG CPDR: 20 | 14 days supply | Qty: 14 | Fill #0

## 2018-02-01 MED FILL — ASPIRIN ADULT LOW STRENGTH: 81 | 45 days supply | Qty: 45 | Fill #0

## 2018-02-01 SURGICAL SUPPLY — 87 items
ANCHOR BUTTON TIGHTROPE ACL RT (Orthopedic Implant) ×2 IMPLANT
ANCHOR PEEK 4.75X19.1 SWLK C (Anchor) ×2 IMPLANT
BANDAGE ACE 6X5 VEL STRL LF (GAUZE/BANDAGES/DRESSINGS) IMPLANT
BANDAGE ESMARK 6X9 LF (GAUZE/BANDAGES/DRESSINGS) IMPLANT
BENZOIN TINCTURE PRP APPL 2/3 (GAUZE/BANDAGES/DRESSINGS) ×2 IMPLANT
BLADE CLIPPER SURG (BLADE) IMPLANT
BLADE SHAVER BONE 5.0X13 (MISCELLANEOUS) IMPLANT
BLADE SURG 10 STRL SS (BLADE) ×2 IMPLANT
BLADE SURG 15 STRL LF DISP TIS (BLADE) ×2 IMPLANT
BLADE SURG 15 STRL SS (BLADE) ×2
BNDG COHESIVE 4X5 TAN STRL (GAUZE/BANDAGES/DRESSINGS) IMPLANT
BNDG ESMARK 6X9 LF (GAUZE/BANDAGES/DRESSINGS)
BONE TUNNEL PLUG CANNULATED (MISCELLANEOUS) IMPLANT
BUR OVAL 4.0 (BURR) ×2 IMPLANT
BURR OVAL 8 FLU 4.0X13 (MISCELLANEOUS) IMPLANT
CHLORAPREP W/TINT 26ML (MISCELLANEOUS) ×2 IMPLANT
COVER BACK TABLE 60X90IN (DRAPES) ×2 IMPLANT
CUFF TOURNIQUET SINGLE 34IN LL (TOURNIQUET CUFF) ×2 IMPLANT
DECANTER SPIKE VIAL GLASS SM (MISCELLANEOUS) IMPLANT
DISSECTOR 3.5MM X 13CM CVD (MISCELLANEOUS) IMPLANT
DISSECTOR 4.0MMX13CM CVD (MISCELLANEOUS) IMPLANT
DRAPE ARTHROSCOPY W/POUCH 90 (DRAPES) ×2 IMPLANT
DRAPE IMP U-DRAPE 54X76 (DRAPES) ×2 IMPLANT
DRAPE INCISE IOBAN 66X45 STRL (DRAPES) IMPLANT
DRAPE OEC MINIVIEW 54X84 (DRAPES) ×2 IMPLANT
DRAPE SWITCH (DRAPES) IMPLANT
DRAPE TOP ARMCOVERS (MISCELLANEOUS) ×2 IMPLANT
DRAPE U-SHAPE 47X51 STRL (DRAPES) ×2 IMPLANT
DRILL FLIPCUTTER III 6-12 (ORTHOPEDIC DISPOSABLE SUPPLIES) ×1 IMPLANT
ELECT REM PT RETURN 9FT ADLT (ELECTROSURGICAL) ×2
ELECTRODE REM PT RTRN 9FT ADLT (ELECTROSURGICAL) ×1 IMPLANT
FIBERSTICK 2 (SUTURE) ×2 IMPLANT
FLIPCUTTER III 6-12 AR-1204FF (ORTHOPEDIC DISPOSABLE SUPPLIES) ×2
GAUZE SPONGE 4X4 12PLY STRL (GAUZE/BANDAGES/DRESSINGS) ×4 IMPLANT
GAUZE XEROFORM 1X8 LF (GAUZE/BANDAGES/DRESSINGS) IMPLANT
GLOVE BIOGEL PI IND STRL 7.0 (GLOVE) ×1 IMPLANT
GLOVE BIOGEL PI IND STRL 8 (GLOVE) ×1 IMPLANT
GLOVE BIOGEL PI INDICATOR 7.0 (GLOVE) ×1
GLOVE BIOGEL PI INDICATOR 8 (GLOVE) ×1
GLOVE ECLIPSE 6.5 STRL STRAW (GLOVE) ×2 IMPLANT
GLOVE ECLIPSE 8.0 STRL XLNG CF (GLOVE) ×4 IMPLANT
GOWN STRL REUS W/ TWL LRG LVL3 (GOWN DISPOSABLE) ×2 IMPLANT
GOWN STRL REUS W/ TWL XL LVL3 (GOWN DISPOSABLE) IMPLANT
GOWN STRL REUS W/TWL LRG LVL3 (GOWN DISPOSABLE) ×2
GOWN STRL REUS W/TWL XL LVL3 (GOWN DISPOSABLE) ×4 IMPLANT
GUIDEPIN FLEX PATHFINDER 2.4MM (WIRE) IMPLANT
IMMOBILIZER KNEE 22 UNIV (SOFTGOODS) IMPLANT
IMMOBILIZER KNEE 24 THIGH 36 (MISCELLANEOUS) IMPLANT
IMMOBILIZER KNEE 24 UNIV (MISCELLANEOUS)
IV NS IRRIG 3000ML ARTHROMATIC (IV SOLUTION) ×8 IMPLANT
KIT LEG STABILIZATION (KITS) IMPLANT
KIT TRANSTIBIAL (DISPOSABLE) ×2 IMPLANT
KIT TURNOVER KIT B (KITS) ×2 IMPLANT
KNEE WRAP E Z 3 GEL PACK (MISCELLANEOUS) ×2 IMPLANT
MANIFOLD NEPTUNE II (INSTRUMENTS) ×2 IMPLANT
NDL SAFETY ECLIPSE 18X1.5 (NEEDLE) ×1 IMPLANT
NEEDLE HYPO 18GX1.5 SHARP (NEEDLE) ×1
NS IRRIG 1000ML POUR BTL (IV SOLUTION) ×2 IMPLANT
PACK ARTHROSCOPY DSU (CUSTOM PROCEDURE TRAY) ×2 IMPLANT
PACK BASIN DAY SURGERY FS (CUSTOM PROCEDURE TRAY) ×2 IMPLANT
PAD CAST 4YDX4 CTTN HI CHSV (CAST SUPPLIES) ×1 IMPLANT
PADDING CAST COTTON 4X4 STRL (CAST SUPPLIES) ×1
PENCIL BUTTON HOLSTER BLD 10FT (ELECTRODE) ×2 IMPLANT
PROBE BIPOLAR ATHRO 135MM 90D (MISCELLANEOUS) ×2 IMPLANT
SCREW FT BIOCOMP 9X30 (Screw) ×2 IMPLANT
SHEET MEDIUM DRAPE 40X70 STRL (DRAPES) ×2 IMPLANT
SLEEVE SCD COMPRESS KNEE MED (MISCELLANEOUS) ×2 IMPLANT
SPONGE LAP 4X18 RFD (DISPOSABLE) ×2 IMPLANT
STRIP CLOSURE SKIN 1/2X4 (GAUZE/BANDAGES/DRESSINGS) ×2 IMPLANT
SUT FIBERWIRE #2 38 T-5 BLUE (SUTURE) ×4
SUT MNCRL AB 3-0 PS2 18 (SUTURE) IMPLANT
SUT MNCRL AB 4-0 PS2 18 (SUTURE) ×2 IMPLANT
SUT VIC AB 0 CT1 27 (SUTURE)
SUT VIC AB 0 CT1 27XBRD ANBCTR (SUTURE) IMPLANT
SUT VIC AB 2-0 SH 27 (SUTURE) ×1
SUT VIC AB 2-0 SH 27XBRD (SUTURE) ×1 IMPLANT
SUTURE FIBERWR #2 38 T-5 BLUE (SUTURE) ×2 IMPLANT
SUTURE TAPE 1.3 FIBERLOP 20 ST (SUTURE) ×2 IMPLANT
SUTURETAPE 1.3 FIBERLOOP 20 ST (SUTURE) ×4
SYR 5ML LUER SLIP (SYRINGE) ×2 IMPLANT
TAPE CLOTH 3X10 TAN LF (GAUZE/BANDAGES/DRESSINGS) IMPLANT
TOWEL GREEN STERILE FF (TOWEL DISPOSABLE) ×2 IMPLANT
TOWEL OR NON WOVEN STRL DISP B (DISPOSABLE) ×4 IMPLANT
TUBE CONNECTING 20X1/4 (TUBING) ×2 IMPLANT
TUBE SUCTION HIGH CAP CLEAR NV (SUCTIONS) ×2 IMPLANT
TUBING ARTHROSCOPY IRRIG 16FT (MISCELLANEOUS) ×2 IMPLANT
WATER STERILE IRR 1000ML POUR (IV SOLUTION) ×2 IMPLANT

## 2018-02-01 NOTE — Anesthesia Postprocedure Evaluation (Signed)
Anesthesia Post Note  Patient: Joshua Gaines  Procedure(s) Performed: RIGHT KNEE ARTHROSCOPY WITH POSSIBLE REVISION ANTERIOR CRUCIATE LIGAMENT (ACL) REPAIR WITH AUTOGRAFT HAMSTRING (Right Knee) RIGHT KNEE ARTHROSCOPY WITH MEDIAL MENISECTOMY, CHONDROPLASTY (Right Knee) RIGHT KNEE ARTHROSCOPY WITH LATERAL MENISECTOMY (Right Knee) HARDWARE REMOVAL (Right Knee) CHONDROPLASTY (Right Knee)     Patient location during evaluation: PACU Anesthesia Type: General Level of consciousness: sedated and patient cooperative Pain management: pain level controlled Vital Signs Assessment: post-procedure vital signs reviewed and stable Respiratory status: spontaneous breathing Cardiovascular status: stable Anesthetic complications: no    Last Vitals:  Vitals:   02/01/18 1300 02/01/18 1315  BP: 125/74 122/75  Pulse: (!) 54 73  Resp: 15 17  Temp:    SpO2: 99% 99%    Last Pain:  Vitals:   02/01/18 1340  TempSrc:   PainSc: Greasy

## 2018-02-01 NOTE — Discharge Instructions (Signed)

## 2018-02-01 NOTE — Anesthesia Preprocedure Evaluation (Addendum)
Anesthesia Evaluation  Patient identified by MRN, date of birth, ID band Patient awake    Reviewed: Allergy & Precautions, NPO status , Patient's Chart, lab work & pertinent test results  Airway Mallampati: I  TM Distance: >3 FB Neck ROM: Full    Dental no notable dental hx. (+) Teeth Intact, Dental Advisory Given   Pulmonary neg pulmonary ROS,    Pulmonary exam normal breath sounds clear to auscultation       Cardiovascular negative cardio ROS Normal cardiovascular exam Rhythm:Regular Rate:Normal     Neuro/Psych negative neurological ROS  negative psych ROS   GI/Hepatic negative GI ROS, Neg liver ROS, GERD  ,  Endo/Other  negative endocrine ROS  Renal/GU negative Renal ROS  negative genitourinary   Musculoskeletal negative musculoskeletal ROS (+)   Abdominal   Peds  Hematology negative hematology ROS (+)   Anesthesia Other Findings Right knee scope with lateral menisectomy  Reproductive/Obstetrics                            Anesthesia Physical Anesthesia Plan  ASA: I  Anesthesia Plan: General and Regional   Post-op Pain Management:  Regional for Post-op pain   Induction: Intravenous  PONV Risk Score and Plan: 2 and Dexamethasone, Ondansetron, Midazolam and Scopolamine patch - Pre-op  Airway Management Planned: LMA  Additional Equipment:   Intra-op Plan:   Post-operative Plan: Extubation in OR  Informed Consent: I have reviewed the patients History and Physical, chart, labs and discussed the procedure including the risks, benefits and alternatives for the proposed anesthesia with the patient or authorized representative who has indicated his/her understanding and acceptance.   Dental advisory given  Plan Discussed with: CRNA  Anesthesia Plan Comments:        Anesthesia Quick Evaluation

## 2018-02-01 NOTE — Progress Notes (Signed)
Assisted D. Woodrum with right, ultrasound guided, adductor canal block. Side rails up, monitors on throughout procedure. See vital signs in flow sheet. Tolerated Procedure well.

## 2018-02-01 NOTE — Anesthesia Procedure Notes (Signed)
Anesthesia Regional Block: Adductor canal block   Pre-Anesthetic Checklist: ,, timeout performed, Correct Patient, Correct Site, Correct Laterality, Correct Procedure, Correct Position, site marked, Risks and benefits discussed,  Surgical consent,  Pre-op evaluation,  At surgeon's request and post-op pain management  Laterality: Right  Prep: Maximum Sterile Barrier Precautions used, chloraprep       Needles:  Injection technique: Single-shot  Needle Type: Echogenic Stimulator Needle     Needle Length: 9cm  Needle Gauge: 22     Additional Needles:   Procedures:,,,, ultrasound used (permanent image in chart),,,,  Narrative:  Start time: 02/01/2018 8:07 AM End time: 02/01/2018 8:17 AM Injection made incrementally with aspirations every 5 mL.  Performed by: Personally  Anesthesiologist: Freddrick March, MD  Additional Notes: Monitors applied. No increased pain on injection. No increased resistance to injection. Injection made in 5cc increments. Good needle visualization. Patient tolerated procedure well.

## 2018-02-01 NOTE — Op Note (Signed)
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  Patient's femoral tunnel was in a 12 o'clock position and did not interfere with appropriate femoral tunnel position as is performed in the modern technique.  Tibial tunnel had a Kurosaka screw which had a long growth and was very difficult to remove but was able to be removed and we utilized the original tibial tunnel moving it anterior just slightly.  We put the scope into the tunnel noting that we had good bony walls on all sides.    Exam under anesthesia: Intervention was full, increased translation on the operative right knee compared to the contralateral side on Lachman.  Patient had a pivot glide on the right knee but no pivot shift or pivot glide on the left knee.  Left knee felt relatively stable  Suprapatellar pouch: 2 loose bodies noted, patellofemoral compartment had grade 2 changes on tele-and grade 3/4 changes on the trochlea 4 x 5 cm lesion mid trochlea with fibrocartilage in the base.  Chondroplasty performed  Medial compartment: Grade 1 changes to the femur and tibia with a vertical tear of the posterior medial meniscus that was debrided back to a stable base, 20% total meniscal volume removed  Lateral Compartment: Significant chondral loss and degenerative change of the femur grade 3 with pitting, degenerative posterior lateral meniscus tear with minimal few remaining hoop fibers in place.  40% total meniscal volume resected near meniscal root primarily.  Intercondylar Notch: Posterior medial portal  was used to resect a posterior lateral loose body.  ACL graft had a partial rupture and the remaining fibers were insufficient.     Post-operative plan: The patient will be weightbearing as tolerated in a knee immobilizer and transition to a brace.  The patient will be discharged home.  DVT prophylaxis aspirin.  Pain control with PRN pain medication preferring oral medicines.  Follow up plan will be scheduled in approximately 7 days for incision check and XR.  Post-Op Diagnosis: Same Surgeons:Primary: Hiram Gash, MD Assistants: Joya Gaskins, OPAC Location: [facility] OR ROOM 6 Anesthesia: General Antibiotics: Ancef 2g preop Tourniquet time:  Total Tourniquet Time Documented: Thigh (Right) - 108 minutes Total: Thigh (Right) - 108 minutes  Estimated Blood Loss: 15 mL Complications: None Specimens: None Implants: Implant Name Type Inv. Item Serial No. Manufacturer Lot No. LRB No. Used Action  TIGHTROPE RT - [id number] Orthopedic Implant TIGHTROPE RT [id number] Advance [id number] Right 1 Implanted  SCREW FT BIOCOMP 9X30 - [id number] Screw SCREW FT BIOCOMP 9X30  [employer] [id number] Right 1 Implanted  SUT ANCHOR PEEK 4.75X19.52M - [id number] Anchor SUT ANCHOR PEEK 4.75X19.52M  [employer] [id number] Right 1 Implanted    Indications for Surgery:   Joshua Gaines is a 37 y.o. male with previous remote BTB autograft ACL reconstruction with continued functional instability and mechanical symptoms.  MRI demonstrated some intact graft fibers but his pivot shift/glide and Lockman were abnormal on the right knee in the clinic.  MRI demonstrated medial and lateral meniscal tears and some chondral changes.  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  Benefits and risks  of operative and nonoperative management were discussed prior to surgery with patient/guardian(s) and informed consent form was completed.  Specific risks  including infection, need for additional surgery, graft rerupture, continued pain secondary to arthritis, stiffness.   Procedure:   The patient was identified in the preoperative holding area where the surgical site was marked. The patient was taken to the OR where a procedural timeout was called and the above noted anesthesia was induced.  The patient was positioned supine on a regular bed.  Preoperative antibiotics were dosed.  The patient's right knee was prepped and draped in the usual sterile fashion.  A second preoperative timeout was called.      A tourniquet was used for the above listed time.  The patient was identified properly. Informed consent was obtained and the surgical site was marked. The patient was taken up to suite where general anesthesia was induced. The patient was placed in the supine position with a post against the surgical leg and a nonsterile tourniquet applied. The surgical leg was then prepped and draped usual sterile fashion.  A standard surgical timeout was performed.  2 standard anterior portals were made and diagnostic arthroscopy performed. Please note the findings as noted above.  Began by examining the ACL.  There is some remaining fibers intact however there is an obvious stump of torn ACL fibers.  Doing a anterior drawer test with the scope in place.  With note that there were no fibers that came into tension with anterior translation of the tibia relative to the femur thus confirming our exam under anesthesia that the ACL was insufficient.  We resected the ACL stump with a shaver.  At this point we turned our attention to the medial and lateral meniscectomies.  Using a shaver and arthroscopic baskets were able to debride both the medial and lateral meniscus back to a stable base.  Findings are in the above sections.  We did note multiple chondral loose bodies throughout the joint measuring 1 x 1 cm in size.  The largest of these was found in the posterior lateral  aspect of the knee and we used a posterior medial portal to remove it.  Chondroplasty was performed with a shaver gently on the patellofemoral compartment mostly on the trochlea as well as on the lateral compartment as there is significant chondral loss.  This point we turned our attention back to the ACL reconstruction.  We withdrew our arthroscope and proceeded with a hamstring harvest in the typical fashion.  Patient had a BTB incision with a small medial flare of the incision distally and we are able to use the inferior third of this incision for harvest.  We dissected through the skin and scar sharply achieving hemostasis as we progressed and identified the sartorial fascia.  This was opened in an L-type configuration just off the tibial tubercle and we used a right angle to separate the gracilis and semitendinosus from the sartorial fascia maintaining length.  These were then whipstitched and using a harvester were able to be harvested with appropriate length.  Total graft length when withdrawn was over 200 mm.  We repeated the steps of the semitendinosus.  The graft was prepped on the back table to a total size of 8.5 mm in diameter.  The patient desired not to use allograft if possible and thus this was used as his graft.  We used a quadrupled hamstring technique over a single button as her method of fixation in the femur with the  plan to use a biointerference screw in the tibia.  We then turned our attention to removing the tibial fixation.  Preoperative planning with MRI and x-ray demonstrated that the metal screw and tibial tunnel position seemed appropriate.  Arthroscopic examination did note that the aperture of the tibial tunnel was appropriately placed inside the joint with the center of the tunnel perhaps slightly posterior to the midpoint of the anterior lateral horn of the lateral meniscus.  This portion of the tunnel as this was a BTB graft was relatively thin in nature secondary to the  graft passing in this area.  We thus found that we were able to remove the graft material likely and drill a tunnel with good bony apposition of each side of the wall.  We then used fluoroscopy to localize the screw position.  Based on our preoperative planning this did not appear to be an atypical screw however on examination her typical screw removal devices did not fit.  We used the [employer] hardware removal set and a series of trial finds to attempt to avoid damage to the cortical bone and withdraw the screw.  We are eventually able to use a bone cutting trephine around the screw to remove it while still leaving good tibial bone for fixation.  The screw was removed without issue.  We now proceeded with her tibial reaming.  A guidewire was placed in the typical fashion transtibial into the joint using arthroscopic visualization.  We able to place this in an anatomic position for the ACL aperture on the tibia.  Once were happy with this we reamed it to a 8 mm size sequentially from a 6 mm size and used a dilator to dilate to 8.5 mm.  The plug was placed to avoid extravasation of fluid.  We then turned our attention to the femur.  A notchplasty was performed of the notch overgrowth until we had a appropriate lateral wall and good visualization to the over-the-top position.  This was confirmed and soft tissue was stripped with a RF ablator.  We used a bur to create our notchplasty and once were happy with this we identified our vertically placed femoral screw and were able to have plenty of room lateral to this to place a tunnel in the typical location of the ACL footprint.  We decided to leave the femoral screw in place as it was not interfering with the tunnel.  It was far anterior and vertical to where we would have typically placed it.  This point we used a flip cutter and placed a 8.5 mm tunnel in the appropriate femoral position at about 1000 on a clock face on this right knee.  We will debride this to  the femoral cortex without issue.  We then proceeded with removing all bone debris with a shaver and a suction device clearing the knee once more prior to passing her graft.  The femoral button was flipped on the lateral femoral cortex and fluoroscopy confirmed its position on cortical bone.  We then were able to pull the graft into the knee using the button the tight rope fixation until it was dunked completely.    At this point we took care to note that there was no graft impingement on the notch in full extension.  There is minimal anisometry of about 2 mm and this was deemed acceptable.  This point we cycled the knee prior to placing in about 20 degrees of flexion and placing a 9 x 30 mm  Arthrex bio composite screw for fixation.  The screws achieved good fixation and another view of the knee arthroscopically determined the screw was not within the joint.  Due to our revision state we thought that backing up her fixation be appropriate.  We used a swivel lock 4.75 in the tibia just distal to our tunnel to achieve good back-up fixation.  The end of the case there is a stable Lockman and no rotational instability on pivot shift or glide testing.  All incisions were copiously irrigated and portals and skin closed in a multilayer fashion with absorbable suture and Steri-Strips.  Incisions closed with absorbable suture. The patient was awoken from general anesthesia and taken to the PACU in stable condition without complication.   The patient was awoken from general anesthesia and taken to the PACU in stable condition without complication.    Joya Gaskins, OPA-C, present and scrubbed throughout the case, critical for completion in a timely fashion, and for retraction, instrumentation, closure.

## 2018-02-01 NOTE — Transfer of Care (Signed)
Immediate Anesthesia Transfer of Care Note  Patient: Joshua Gaines  Procedure(s) Performed: RIGHT KNEE ARTHROSCOPY WITH POSSIBLE REVISION ANTERIOR CRUCIATE LIGAMENT (ACL) REPAIR WITH AUTOGRAFT HAMSTRING (Right Knee) RIGHT KNEE ARTHROSCOPY WITH MEDIAL MENISECTOMY, CHONDROPLASTY (Right Knee) RIGHT KNEE ARTHROSCOPY WITH LATERAL MENISECTOMY (Right Knee) HARDWARE REMOVAL (Right Knee) CHONDROPLASTY (Right Knee)  Patient Location: PACU  Anesthesia Type:GA combined with regional for post-op pain  Level of Consciousness: sedated and responds to stimulation  Airway & Oxygen Therapy: Patient Spontanous Breathing and Patient connected to face mask oxygen  Post-op Assessment: Report given to RN and Post -op Vital signs reviewed and stable  Post vital signs: Reviewed and stable  Last Vitals:  Vitals Value Taken Time  BP 129/72 02/01/2018 11:23 AM  Temp    Pulse 71 02/01/2018 11:24 AM  Resp 15 02/01/2018 11:24 AM  SpO2 100 % 02/01/2018 11:24 AM  Vitals shown include unvalidated device data.  Last Pain:  Vitals:   02/01/18 0711  TempSrc: Oral  PainSc: 0-No pain         Complications: No apparent anesthesia complications

## 2018-02-01 NOTE — Anesthesia Procedure Notes (Signed)
Procedure Name: LMA Insertion Date/Time: 02/01/2018 8:42 AM Performed by: Lyndee Leo, CRNA Pre-anesthesia Checklist: Patient identified, Emergency Drugs available, Suction available and Patient being monitored Patient Re-evaluated:Patient Re-evaluated prior to induction Oxygen Delivery Method: Circle system utilized Preoxygenation: Pre-oxygenation with 100% oxygen Induction Type: IV induction Ventilation: Mask ventilation without difficulty LMA: LMA inserted LMA Size: 4.0 Number of attempts: 1 Airway Equipment and Method: Bite block Placement Confirmation: positive ETCO2 Tube secured with: Tape Dental Injury: Teeth and Oropharynx as per pre-operative assessment

## 2018-02-01 NOTE — H&P (Signed)
PREOPERATIVE H&P  Chief Complaint: * No pre-op diagnosis entered *  HPI: Joshua Gaines is a 37 y.o. male who presents for preoperative history and physical with a diagnosis of * No pre-op diagnosis entered *. Symptoms are rated as moderate to severe, and have been worsening.  This is significantly impairing activities of daily living.  Please see my clinic note for full details on this patient's care.  He has elected for surgical management.   Past Medical History:  Diagnosis Date  . GERD (gastroesophageal reflux disease)    Past Surgical History:  Procedure Laterality Date  . ANTERIOR CRUCIATE LIGAMENT REPAIR  [id number]  . HERNIA REPAIR    . MENISCUS REPAIR     left knee   Social History   Socioeconomic History  . Marital status: Married    Spouse name: Not on file  . Number of children: Not on file  . Years of education: Not on file  . Highest education level: Not on file  Occupational History  . Not on file  Social Needs  . Financial resource strain: Not on file  . Food insecurity:    Worry: Not on file    Inability: Not on file  . Transportation needs:    Medical: Not on file    Non-medical: Not on file  Tobacco Use  . Smoking status: Never Smoker  . Smokeless tobacco: Never Used  Substance and Sexual Activity  . Alcohol use: No  . Drug use: No  . Sexual activity: Yes    Partners: Female  Lifestyle  . Physical activity:    Days per week: Not on file    Minutes per session: Not on file  . Stress: Not on file  Relationships  . Social connections:    Talks on phone: Not on file    Gets together: Not on file    Attends religious service: Not on file    Active member of club or organization: Not on file    Attends meetings of clubs or organizations: Not on file    Relationship status: Not on file  Other Topics Concern  . Not on file  Social History Narrative  . Not on file   History reviewed. No pertinent family history. No Known Allergies Prior to  Admission medications   Medication Sig Start Date End Date Taking? Authorizing Provider  Multiple Vitamin (MULTIVITAMIN) tablet Take 1 tablet by mouth daily.   Yes [provider]  Protein POWD Take by mouth.   Yes [provider]     Positive ROS: All other systems have been reviewed and were otherwise negative with the exception of those mentioned in the HPI and as above.  Physical Exam: General: Alert, no acute distress Cardiovascular: No pedal edema Respiratory: No cyanosis, no use of accessory musculature GI: No organomegaly, abdomen is soft and non-tender Skin: No lesions in the area of chief complaint Neurologic: Sensation intact distally Psychiatric: Patient is competent for consent with normal mood and affect Lymphatic: No axillary or cervical lymphadenopathy  MUSCULOSKELETAL: R knee: equivocal lachman, pivot glide, ttp medially and laterally at joint.   Assessment: * No pre-op diagnosis entered *  Plan: Plan for Procedure(s): RIGHT KNEE ARTHROSCOPY WITH POSSIBLE REVISION ANTERIOR CRUCIATE LIGAMENT (ACL) REPAIR WITH AUTOGRAFT HAMSTRING VS BONE GRAFT RIGHT KNEE ARTHROSCOPY WITH MEDIAL MENISECTOMY, CHONDROPLASTY RIGHT KNEE ARTHROSCOPY WITH LATERAL MENISECTOMY HARDWARE REMOVAL  The risks benefits and alternatives were discussed with the patient including but not limited to the risks of nonoperative  treatment, versus surgical intervention including infection, bleeding, nerve injury,  blood clots, cardiopulmonary complications, morbidity, mortality, among others, and they were willing to proceed.   Hiram Gash, MD  02/01/2018 8:27 AM

## 2018-02-03 ENCOUNTER — Encounter (HOSPITAL_BASED_OUTPATIENT_CLINIC_OR_DEPARTMENT_OTHER): Payer: Self-pay | Admitting: Orthopaedic Surgery

## 2018-02-10 DIAGNOSIS — M2341 Loose body in knee, right knee: Secondary | ICD-10-CM | POA: Diagnosis not present

## 2018-02-14 ENCOUNTER — Other Ambulatory Visit: Payer: Self-pay

## 2018-02-14 ENCOUNTER — Ambulatory Visit: Payer: 59 | Attending: Orthopaedic Surgery | Admitting: Physical Therapy

## 2018-02-14 ENCOUNTER — Encounter: Payer: Self-pay | Admitting: Physical Therapy

## 2018-02-14 DIAGNOSIS — M25561 Pain in right knee: Secondary | ICD-10-CM | POA: Diagnosis not present

## 2018-02-14 DIAGNOSIS — R2689 Other abnormalities of gait and mobility: Secondary | ICD-10-CM | POA: Insufficient documentation

## 2018-02-14 DIAGNOSIS — R600 Localized edema: Secondary | ICD-10-CM | POA: Diagnosis not present

## 2018-02-14 DIAGNOSIS — M6281 Muscle weakness (generalized): Secondary | ICD-10-CM | POA: Diagnosis not present

## 2018-02-14 DIAGNOSIS — M25661 Stiffness of right knee, not elsewhere classified: Secondary | ICD-10-CM | POA: Insufficient documentation

## 2018-02-14 NOTE — Therapy (Signed)
[place], [place], [address] Phone: [phone]   Fax:  [phone]  Physical Therapy Evaluation  Patient Details  Name: Joshua Gaines MRN: [id number] Date of Birth: 1981-01-05 Referring Provider (PT): Ophelia Charter MD   Encounter Date: 02/14/2018  PT End of Session - 02/14/18 1514    Visit Number  1    Number of Visits  17    Date for PT Re-Evaluation  04/11/18    Authorization Type  [facility] [employer]    PT Start Time  1330    PT Stop Time  1425    PT Time Calculation (min)  55 min    Activity Tolerance  Patient tolerated treatment well    Behavior During Therapy  [facility] for tasks assessed/performed       Past Medical History:  Diagnosis Date  . GERD (gastroesophageal reflux disease)     Past Surgical History:  Procedure Laterality Date  . ANTERIOR CRUCIATE LIGAMENT REPAIR  [id number]  . CHONDROPLASTY Right 02/01/2018   Procedure: CHONDROPLASTY;  Surgeon: Hiram Gash, MD;  Location: [place];  Service: Orthopedics;  Laterality: Right;  . HARDWARE REMOVAL Right 02/01/2018   Procedure: HARDWARE REMOVAL;  Surgeon: Hiram Gash, MD;  Location: [place];  Service: Orthopedics;  Laterality: Right;  . HERNIA REPAIR    . KNEE ARTHROSCOPY WITH ANTERIOR CRUCIATE LIGAMENT (ACL) REPAIR WITH HAMSTRING GRAFT Right 02/01/2018   Procedure: RIGHT KNEE ARTHROSCOPY WITH POSSIBLE REVISION ANTERIOR CRUCIATE LIGAMENT (ACL) REPAIR WITH AUTOGRAFT HAMSTRING;  Surgeon: Hiram Gash, MD;  Location: [place];  Service: Orthopedics;  Laterality: Right;  . KNEE ARTHROSCOPY WITH LATERAL MENISECTOMY Right 02/01/2018   Procedure: RIGHT KNEE ARTHROSCOPY WITH LATERAL MENISECTOMY;  Surgeon: Hiram Gash, MD;  Location: [place];  Service: Orthopedics;  Laterality: Right;  . KNEE ARTHROSCOPY WITH MEDIAL MENISECTOMY Right 02/01/2018   Procedure: RIGHT KNEE ARTHROSCOPY WITH MEDIAL  MENISECTOMY, CHONDROPLASTY;  Surgeon: Hiram Gash, MD;  Location: [place];  Service: Orthopedics;  Laterality: Right;  . MENISCUS REPAIR     left knee    There were no vitals filed for this visit.   Subjective Assessment - 02/14/18 1336    Subjective  pt is a 37 y.o M R  ACl reconstruction on 02/01/2018. since surgery he reports pain has been managebale and hasn't had to take much medication. He has been working on elevating the knee and ice intermittently. Reports doing some straight leg raises at home but using the hinged braced but is compliant with use of the brace most of the time.     Limitations  Standing    How long can you sit comfortably?  unlimited    How long can you stand comfortably?  10-15 min    How long can you walk comfortably?  5-10 min     Diagnostic tests  MRI, x-ray     Patient Stated Goals  return back to normal as much as possible, playing basketball,     Currently in Pain?  Yes    Pain Score  3    at worst 6/10,    Pain Location  Knee    Pain Orientation  Right    Pain Descriptors / Indicators  Aching;Sore    Pain Type  Surgical pain    Pain Onset  In the past 7 days    Pain Frequency  Intermittent    Aggravating Factors  going from sitting to standing,     Pain Relieving Factors  standing and slinging the          [facility] PT Assessment - 02/14/18 1319      Assessment   Medical Diagnosis  S/P R ACL Reconstruction (hamstring graft)    Referring Provider (PT)  Ophelia Charter MD    Onset Date/Surgical Date  02/01/18    Hand Dominance  Right    Prior Therapy  yes      Precautions   Precaution Comments  to do seated exercises at the gym, no free weights, stay in the brace      Restrictions   Weight Bearing Restrictions  No      Balance Screen   Has the patient fallen in the past 6 months  No    Has the patient had a decrease in activity level because of a fear of falling?   No    Is the patient reluctant to leave their home because of  a fear of falling?   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, standing, walking      Cognition   Overall Cognitive Status  Within Functional Limits for tasks assessed      Observation/Other Assessments   Focus on Therapeutic Outcomes (FOTO)   59% limited   predicted 34% limited     Observation/Other Assessments-Edema    Edema  Circumferential      Circumferential Edema   Circumferential - Right  @ joint line 40cm    Circumferential - Left   @ joint line 42cm , 10cm above 44cm, 10cm below 38cm      Posture/Postural Control   Posture/Postural Control  Postural limitations      ROM / Strength   AROM / PROM / Strength  AROM;PROM;Strength      AROM   AROM Assessment Site  Knee    Right/Left Knee  Right;Left    Right Knee Extension  14    Right Knee Flexion  62    Left Knee Extension  2    Left Knee Flexion  125      PROM   PROM Assessment Site  Knee    Right/Left Knee  Right;Left    Right Knee Extension  9    Right Knee Flexion  72      Strength   Overall Strength Comments  R knee strength not assessed due to precautions    Strength Assessment Site  Knee    Right/Left Knee  Right;Left    Left Knee Flexion  5/5    Left Knee Extension  5/5      Palpation   Patella mobility  hypomobilty of the patella compared bil    Palpation comment  TTP  along the medial joint line,       Ambulation/Gait   Ambulation/Gait  Yes     Gait Pattern  Step-through pattern;Decreased stride length;Decreased stance time - right;Decreased step length - left;Right circumduction;Trendelenburg;Antalgic    Gait Comments  with locked hinged knee brace                Objective measurements completed on examination: See above findings.      [place] Adult PT Treatment/Exercise - 02/14/18 1319      Exercises   Exercises  Knee/Hip      Knee/Hip Exercises: Stretches   Active Hamstring Stretch  2 reps;30 seconds      Knee/Hip Exercises: Standing   Other Standing Knee Exercises  standing weight shifts with RLE leading to promote activation of R quad 2 x 10      Knee/Hip Exercises: Supine   Quad Sets  1 set;10 reps;Right;Strengthening      Modalities   Modalities  Vasopneumatic      Vasopneumatic   Number Minutes Vasopneumatic   10 minutes    Vasopnuematic Location   Knee    Vasopneumatic Pressure  Medium    Vasopneumatic Temperature   34             PT Education - 02/14/18 1513    Education Details  evaluation findings, POC, goals, HEP with proper form, beneifts of using brace for safety at all times,     Person(s) Educated  Patient    Methods  Explanation;Verbal cues;Handout    Comprehension  Verbalized understanding;Verbal cues required       PT Short Term Goals - 02/14/18 1755      PT SHORT TERM GOAL #1   Title  pt to be I with inital HEp     Time  4    Period  Weeks    Status  New    Target Date  03/14/18      PT SHORT TERM GOAL #2   Title  pt to verbalize and demo techniques to reduce pain and inflammation via RICE and HEP    Time  4    Period  Weeks    Status  New    Target Date  03/14/18      PT SHORT TERM GOAL #3   Title  pt to demo good quad activation and SLR with </= 10 degree quad lag for functional progression    Time  4    Period  Weeks    Status  New    Target Date  03/14/18      PT SHORT TERM GOAL #4   Title  pt to increase knee flexion to >/= 90 degrees with </= 4/10  pain for therapuetic progression     Time  4    Period  Weeks    Status  New    Target Date  03/14/18        PT Long Term Goals - 02/14/18 1757      PT LONG TERM GOAL #1   Title  increase ROM to </= 2 degrees and >/= 120 degrees with </= 1/10 pain for functional ROM required for efficent gait and ADLs    Time  8    Period  Weeks    Status  New    Target Date  04/11/18      PT LONG TERM GOAL #2   Title  pt to demo RLE strength to >/= 4+/5 in all planes to promote  knee stability with standing/ walking and dynamic activities    Time  8    Period  Weeks    Status  New    Target Date  04/11/18      PT LONG TERM GOAL #3   Title  pt to be able to perfor dynamic activities and jogging/ hopping with no report of pain or instability for personal goal of returning to playing basketball     Time  8    Period  Weeks    Status  New    Target Date  04/11/18      PT LONG TERM GOAL #4   Title  increase FOTO score to </= 34% limited to demo improvement in function    Time  8    Period  Weeks    Status  New    Target Date  04/11/18      PT LONG TERM GOAL #5   Title  pt to be I with all HEP given as of last visit to maintain and progress current level of function independently.     Time  8    Period  Weeks    Status  New    Target Date  04/11/18             Plan - 02/14/18 1749    Clinical Impression Statement  pt is a 37 y.o M presenting to OPPT following R ACL reconstruction on 02/01/3018. He demonstrates limited knee ROM and increased swelling as expect following surgery. pt currently amb only with a locked hinged brace with limited stance on the RLE and step length on the LLE. Strength wasn't assessed in the RLE due to precautions. He would benefit from physical therapy to decrease R knee pain, promote mobility and strength, maximize function by addressing the deficits listed.     Clinical Presentation  Stable    Clinical Decision Making  Low    Rehab Potential  Good    PT  Frequency  2x / week    PT Duration  8 weeks    PT Treatment/Interventions  ADLs/Self Care Home Management;Cryotherapy;Iontophoresis 4mg /ml Dexamethasone;Electrical Stimulation;Moist Heat;Ultrasound;Balance training;Neuromuscular re-education;Therapeutic activities;Therapeutic exercise;Manual techniques;Vasopneumatic Device;Gait training;Patient/family education;Taping    PT Next Visit Plan  review/ update HEP, bike knee ROM, quad activation, hip strength, ankle strength, vaso for swelling    PT Home Exercise Plan  quad set, standing weight shifting for involuntary quad activation, hip abduction, heel raise (in brace), heel slides with strap    Consulted and Agree with Plan of Care  Patient       Patient will benefit from skilled therapeutic intervention in order to improve the following deficits and impairments:  Abnormal gait, Pain, Decreased strength, Increased fascial restricitons, Increased edema, Decreased balance, Decreased endurance, Improper body mechanics, Postural dysfunction, Decreased range of motion  Visit Diagnosis: Acute pain of right knee  Stiffness of right knee, not elsewhere classified  Muscle weakness (generalized)  Localized edema  Other abnormalities of gait and mobility     Problem List Patient Active Problem List   Diagnosis Date Noted  . Annual physical exam 12/23/2016  . Acute meniscal tear of right knee 02/27/2015  . Right foot pain 01/19/2013   Starr Lake PT, DPT, LAT, ATC  02/14/18  6:06 PM      [facility] [address] [place], [place], [address] Phone: [phone]   Fax:  [phone]  Name: Joshua Gaines MRN: [id number] Date of Birth: [birth year]

## 2018-02-14 NOTE — Therapy (Deleted)
[place], [place], [address] Phone: [phone]   Fax:  [phone]  Physical Therapy Treatment  Patient Details  Name: Joshua Gaines MRN: [id number] Date of Birth: Sep 24, 1980 Referring Provider (PT): Ophelia Charter MD   Encounter Date: 02/14/2018  PT End of Session - 02/14/18 1514    Visit Number  1    Number of Visits  17    Date for PT Re-Evaluation  04/11/18    Authorization Type  [facility] [employer]    PT Start Time  1330    PT Stop Time  1425    PT Time Calculation (min)  55 min    Activity Tolerance  Patient tolerated treatment well    Behavior During Therapy  [facility] for tasks assessed/performed       Past Medical History:  Diagnosis Date  . GERD (gastroesophageal reflux disease)     Past Surgical History:  Procedure Laterality Date  . ANTERIOR CRUCIATE LIGAMENT REPAIR  [id number]  . CHONDROPLASTY Right 02/01/2018   Procedure: CHONDROPLASTY;  Surgeon: Hiram Gash, MD;  Location: [place];  Service: Orthopedics;  Laterality: Right;  . HARDWARE REMOVAL Right 02/01/2018   Procedure: HARDWARE REMOVAL;  Surgeon: Hiram Gash, MD;  Location: [place];  Service: Orthopedics;  Laterality: Right;  . HERNIA REPAIR    . KNEE ARTHROSCOPY WITH ANTERIOR CRUCIATE LIGAMENT (ACL) REPAIR WITH HAMSTRING GRAFT Right 02/01/2018   Procedure: RIGHT KNEE ARTHROSCOPY WITH POSSIBLE REVISION ANTERIOR CRUCIATE LIGAMENT (ACL) REPAIR WITH AUTOGRAFT HAMSTRING;  Surgeon: Hiram Gash, MD;  Location: [place];  Service: Orthopedics;  Laterality: Right;  . KNEE ARTHROSCOPY WITH LATERAL MENISECTOMY Right 02/01/2018   Procedure: RIGHT KNEE ARTHROSCOPY WITH LATERAL MENISECTOMY;  Surgeon: Hiram Gash, MD;  Location: [place];  Service: Orthopedics;  Laterality: Right;  . KNEE ARTHROSCOPY WITH MEDIAL MENISECTOMY Right 02/01/2018   Procedure: RIGHT KNEE ARTHROSCOPY WITH MEDIAL  MENISECTOMY, CHONDROPLASTY;  Surgeon: Hiram Gash, MD;  Location: [place];  Service: Orthopedics;  Laterality: Right;  . MENISCUS REPAIR     left knee    There were no vitals filed for this visit.  Subjective Assessment - 02/14/18 1336    Subjective  pt is a 37 y.o M R  ACl reconstruction on 02/01/2018. since surgery he reports pain has been managebale and hasn't had to take much medication. He has been working on elevating the knee and ice intermittently. Reports doing some straight leg raises at home but using the hinged braced but is compliant with use of the brace most of the time.     Limitations  Standing    How long can you sit comfortably?  unlimited    How long can you stand comfortably?  10-15 min    How long can you walk comfortably?  5-10 min     Diagnostic tests  MRI, x-ray     Patient Stated Goals  return back to normal as much as possible, playing basketball,     Currently in Pain?  Yes    Pain Score  3    at worst 6/10,    Pain Location  Knee    Pain Orientation  Right    Pain Descriptors / Indicators  Aching;Sore    Pain Type  Surgical pain    Pain Onset  In the past 7 days    Pain Frequency  Intermittent    Aggravating Factors  going from sitting to standing,     Pain Relieving Factors  standing and slinging the          [facility] PT Assessment - 02/14/18 1319      Assessment   Medical Diagnosis  S/P R ACL Reconstruction (hamstring graft)    Referring Provider (PT)  Ophelia Charter MD    Onset Date/Surgical Date  02/01/18    Hand Dominance  Right    Prior Therapy  yes      Precautions   Precaution Comments  to do seated exercises at the gym, no free weights, stay in the brace      Restrictions   Weight Bearing Restrictions  No      Balance Screen   Has the patient fallen in the past 6 months  No    Has the patient had a decrease in activity level because of a fear of falling?   No    Is the patient reluctant to leave their home because of a  fear of falling?   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, standing, walking      Cognition   Overall Cognitive Status  Within Functional Limits for tasks assessed      Observation/Other Assessments   Focus on Therapeutic Outcomes (FOTO)   59% limited   predicted 34% limited     Observation/Other Assessments-Edema    Edema  Circumferential      Circumferential Edema   Circumferential - Right  @ joint line 40cm    Circumferential - Left   @ joint line 42cm , 10cm above 44cm, 10cm below 38cm      Posture/Postural Control   Posture/Postural Control  Postural limitations      ROM / Strength   AROM / PROM / Strength  AROM;PROM;Strength      AROM   AROM Assessment Site  Knee    Right/Left Knee  Right;Left    Right Knee Extension  14    Right Knee Flexion  62    Left Knee Extension  2    Left Knee Flexion  125      PROM   PROM Assessment Site  Knee    Right/Left Knee  Right;Left    Right Knee Extension  9    Right Knee Flexion  72      Strength   Overall Strength Comments  R knee strength not assessed due to precautions    Strength Assessment Site  Knee    Right/Left Knee  Right;Left    Left Knee Flexion  5/5    Left Knee Extension  5/5      Palpation   Patella mobility  hypomobilty of the patella compared bil    Palpation comment  TTP  along the medial joint line,       Ambulation/Gait   Ambulation/Gait  Yes     Gait Pattern  Step-through pattern;Decreased stride length;Decreased stance time - right;Decreased step length - left;Right circumduction;Trendelenburg;Antalgic    Gait Comments  with locked hinged knee brace                   [facility] Adult PT Treatment/Exercise - 02/14/18 1319      Exercises   Exercises  Knee/Hip      Knee/Hip Exercises: Stretches   Active Hamstring Stretch  2 reps;30 seconds      Knee/Hip Exercises: Standing   Other Standing Knee Exercises  standing weight shifts with RLE leading to promote activation of R quad 2 x 10      Knee/Hip Exercises: Supine   Quad Sets  1 set;10 reps;Right;Strengthening      Modalities   Modalities  Vasopneumatic      Vasopneumatic   Number Minutes Vasopneumatic   10 minutes    Vasopnuematic Location   Knee    Vasopneumatic Pressure  Medium    Vasopneumatic Temperature   34             PT Education - 02/14/18 1513    Education Details  evaluation findings, POC, goals, HEP with proper form, beneifts of using brace for safety at all times,     Person(s) Educated  Patient    Methods  Explanation;Verbal cues;Handout    Comprehension  Verbalized understanding;Verbal cues required       PT Short Term Goals - 02/14/18 1755      PT SHORT TERM GOAL #1   Title  pt to be I with inital HEp     Time  4    Period  Weeks    Status  New    Target Date  03/14/18      PT SHORT TERM GOAL #2   Title  pt to verbalize and demo techniques to reduce pain and inflammation via RICE and HEP    Time  4    Period  Weeks    Status  New    Target Date  03/14/18      PT SHORT TERM GOAL #3   Title  pt to demo good quad activation and SLR with </= 10 degree quad lag for functional progression    Time  4    Period  Weeks    Status  New    Target Date  03/14/18      PT SHORT TERM GOAL #4   Title  pt to increase knee flexion to >/= 90 degrees with </= 4/10 pain for therapuetic progression     Time  4    Period  Weeks    Status   New    Target Date  03/14/18        PT Long Term Goals - 02/14/18 1757      PT LONG TERM GOAL #1   Title  increase ROM to </= 2 degrees and >/= 120 degrees with </= 1/10 pain for functional ROM required for efficent gait and ADLs    Time  8    Period  Weeks    Status  New    Target Date  04/11/18      PT LONG TERM GOAL #2   Title  pt to demo RLE strength to >/= 4+/5 in all planes to promote knee stability with standing/ walking and dynamic activities  Time  8    Period  Weeks    Status  New    Target Date  04/11/18      PT LONG TERM GOAL #3   Title  pt to be able to perfor dynamic activities and jogging/ hopping with no report of pain or instability for personal goal of returning to playing basketball     Time  8    Period  Weeks    Status  New    Target Date  04/11/18      PT LONG TERM GOAL #4   Title  increase FOTO score to </= 34% limited to demo improvement in function    Time  8    Period  Weeks    Status  New    Target Date  04/11/18      PT LONG TERM GOAL #5   Title  pt to be I with all HEP given as of last visit to maintain and progress current level of function independently.     Time  8    Period  Weeks    Status  New    Target Date  04/11/18            Plan - 02/14/18 1749    Clinical Impression Statement  pt is a 37 y.o M presenting to OPPT following R ACL reconstruction on 02/01/3018. He demonstrates limited knee ROM and increased swelling as expect following surgery. pt currently amb only with a locked hinged brace with limited stance on the RLE and step length on the LLE. Strength wasn't assessed in the RLE due to precautions. He would benefit from physical therapy to decrease R knee pain, promote mobility and strength, maximize function by addressing the deficits listed.     Clinical Presentation  Stable    Clinical Decision Making  Low    Rehab Potential  Good    PT Frequency  2x / week    PT Duration  8 weeks    PT Treatment/Interventions   ADLs/Self Care Home Management;Cryotherapy;Iontophoresis 4mg /ml Dexamethasone;Electrical Stimulation;Moist Heat;Ultrasound;Balance training;Neuromuscular re-education;Therapeutic activities;Therapeutic exercise;Manual techniques;Vasopneumatic Device;Gait training;Patient/family education;Taping    PT Next Visit Plan  review/ update HEP, bike knee ROM, quad activation, hip strength, ankle strength, vaso for swelling    PT Home Exercise Plan  quad set, standing weight shifting for involuntary quad activation, hip abduction, heel raise (in brace), heel slides with strap    Consulted and Agree with Plan of Care  Patient       Patient will benefit from skilled therapeutic intervention in order to improve the following deficits and impairments:  Abnormal gait, Pain, Decreased strength, Increased fascial restricitons, Increased edema, Decreased balance, Decreased endurance, Improper body mechanics, Postural dysfunction, Decreased range of motion  Visit Diagnosis: Acute pain of right knee  Stiffness of right knee, not elsewhere classified  Muscle weakness (generalized)  Localized edema  Other abnormalities of gait and mobility     Problem List Patient Active Problem List   Diagnosis Date Noted  . Annual physical exam 12/23/2016  . Acute meniscal tear of right knee 02/27/2015  . Right foot pain 01/19/2013    Starr Lake 02/14/2018, 6:06 PM  [facility] [address] [place], [place], [address] Phone: [phone]   Fax:  [phone]  Name: Joshua Gaines MRN: [id number] Date of Birth: 07/21/1980

## 2018-02-17 ENCOUNTER — Ambulatory Visit: Payer: 59 | Admitting: Physical Therapy

## 2018-02-17 ENCOUNTER — Encounter: Payer: Self-pay | Admitting: Physical Therapy

## 2018-02-17 DIAGNOSIS — M25561 Pain in right knee: Secondary | ICD-10-CM

## 2018-02-17 DIAGNOSIS — R600 Localized edema: Secondary | ICD-10-CM | POA: Diagnosis not present

## 2018-02-17 DIAGNOSIS — M6281 Muscle weakness (generalized): Secondary | ICD-10-CM

## 2018-02-17 DIAGNOSIS — R2689 Other abnormalities of gait and mobility: Secondary | ICD-10-CM | POA: Diagnosis not present

## 2018-02-17 DIAGNOSIS — M25661 Stiffness of right knee, not elsewhere classified: Secondary | ICD-10-CM

## 2018-02-17 NOTE — Therapy (Signed)
[place], [place], [address] Phone: [phone]   Fax:  [phone]  Physical Therapy Treatment  Patient Details  Name: Joshua Gaines MRN: [id number] Date of Birth: 1981-04-14 Referring Provider (PT): Ophelia Charter MD   Encounter Date: 02/17/2018  PT End of Session - 02/17/18 1149    Visit Number  2    Number of Visits  17    Date for PT Re-Evaluation  04/11/18    PT Start Time  1148    PT Stop Time  1253    PT Time Calculation (min)  65 min    Activity Tolerance  Patient tolerated treatment well    Behavior During Therapy  [facility] for tasks assessed/performed       Past Medical History:  Diagnosis Date  . GERD (gastroesophageal reflux disease)     Past Surgical History:  Procedure Laterality Date  . ANTERIOR CRUCIATE LIGAMENT REPAIR  [id number]  . CHONDROPLASTY Right 02/01/2018   Procedure: CHONDROPLASTY;  Surgeon: Hiram Gash, MD;  Location: [place];  Service: Orthopedics;  Laterality: Right;  . HARDWARE REMOVAL Right 02/01/2018   Procedure: HARDWARE REMOVAL;  Surgeon: Hiram Gash, MD;  Location: [place];  Service: Orthopedics;  Laterality: Right;  . HERNIA REPAIR    . KNEE ARTHROSCOPY WITH ANTERIOR CRUCIATE LIGAMENT (ACL) REPAIR WITH HAMSTRING GRAFT Right 02/01/2018   Procedure: RIGHT KNEE ARTHROSCOPY WITH POSSIBLE REVISION ANTERIOR CRUCIATE LIGAMENT (ACL) REPAIR WITH AUTOGRAFT HAMSTRING;  Surgeon: Hiram Gash, MD;  Location: [place];  Service: Orthopedics;  Laterality: Right;  . KNEE ARTHROSCOPY WITH LATERAL MENISECTOMY Right 02/01/2018   Procedure: RIGHT KNEE ARTHROSCOPY WITH LATERAL MENISECTOMY;  Surgeon: Hiram Gash, MD;  Location: [place];  Service: Orthopedics;  Laterality: Right;  . KNEE ARTHROSCOPY WITH MEDIAL MENISECTOMY Right 02/01/2018   Procedure: RIGHT KNEE ARTHROSCOPY WITH MEDIAL MENISECTOMY, CHONDROPLASTY;   Surgeon: Hiram Gash, MD;  Location: [place];  Service: Orthopedics;  Laterality: Right;  . MENISCUS REPAIR     left knee    There were no vitals filed for this visit.                    [place] Adult PT Treatment/Exercise - 02/17/18 1200      Knee/Hip Exercises: Stretches   Active Hamstring Stretch  2 reps;30 seconds      Knee/Hip Exercises: Aerobic   Recumbent Bike  1/2 revolutions x 6 min    in brace with the brace unlocked     Knee/Hip Exercises: Supine   Quad Sets  2 sets;10 reps   with towel beneath knee to promote quad activation     Knee/Hip Exercises: Sidelying   Hip ABduction  Right;1 set;10 reps   1 x CW and CCW 10 reps     Knee/Hip Exercises: Prone   Hip Extension  2 sets;10 reps   combined with prone hamstring curl   Other Prone Exercises  prone hang x 2 min    provided handout for HEP     Vasopneumatic   Number Minutes Vasopneumatic   15 minutes    Vasopnuematic Location   Knee    Vasopneumatic Pressure  Medium    Vasopneumatic Temperature   34      Manual Therapy   Manual Therapy  Soft tissue mobilization;Joint mobilization    Joint Mobilization  patellar superior/ inferior mobs  grade 3  Soft tissue mobilization  IASTM along the quad tendon on the R             PT Education - 02/17/18 1242    Education Details  update HEP for prone hangs.     Person(s) Educated  Patient    Methods  Explanation;Verbal cues;Handout    Comprehension  Verbalized understanding;Verbal cues required       PT Short Term Goals - 02/14/18 1755      PT SHORT TERM GOAL #1   Title  pt to be I with inital HEp     Time  4    Period  Weeks    Status  New    Target Date  03/14/18      PT SHORT TERM GOAL #2   Title  pt to verbalize and demo techniques to reduce pain and inflammation via RICE and HEP    Time  4    Period  Weeks    Status  New    Target Date  03/14/18      PT SHORT TERM GOAL #3   Title  pt to demo good quad  activation and SLR with </= 10 degree quad lag for functional progression    Time  4    Period  Weeks    Status  New    Target Date  03/14/18      PT SHORT TERM GOAL #4   Title  pt to increase knee flexion to >/= 90 degrees with </= 4/10 pain for therapuetic progression     Time  4    Period  Weeks    Status  New    Target Date  03/14/18        PT Long Term Goals - 02/14/18 1757      PT LONG TERM GOAL #1   Title  increase ROM to </= 2 degrees and >/= 120 degrees with </= 1/10 pain for functional ROM required for efficent gait and ADLs    Time  8    Period  Weeks    Status  New    Target Date  04/11/18      PT LONG TERM GOAL #2   Title  pt to demo RLE strength to >/= 4+/5 in all planes to promote knee stability with standing/ walking and dynamic activities    Time  8    Period  Weeks    Status  New    Target Date  04/11/18      PT LONG TERM GOAL #3   Title  pt to be able to perfor dynamic activities and jogging/ hopping with no report of pain or instability for personal goal of returning to playing basketball     Time  8    Period  Weeks    Status  New    Target Date  04/11/18      PT LONG TERM GOAL #4   Title  increase FOTO score to </= 34% limited to demo improvement in function    Time  8    Period  Weeks    Status  New    Target Date  04/11/18      PT LONG TERM GOAL #5   Title  pt to be I with all HEP given as of last visit to maintain and progress current level of function independently.     Time  8    Period  Weeks    Status  New  Target Date  04/11/18            Plan - 02/17/18 1304    Clinical Impression Statement  pt reports increased soreness since the last session along the quads and has been compliant with his HEP as well as using his brace all the time. continued working quad activation and hip / ankle strengthening. STW along the quad tendon to calm down soreness. ended session with vaso to calm down swelling and inflammation.     PT  Treatment/Interventions  ADLs/Self Care Home Management;Cryotherapy;Iontophoresis 4mg /ml Dexamethasone;Electrical Stimulation;Moist Heat;Ultrasound;Balance training;Neuromuscular re-education;Therapeutic activities;Therapeutic exercise;Manual techniques;Vasopneumatic Device;Gait training;Patient/family education;Taping    PT Next Visit Plan  review/ update HEP, bike knee ROM, quad activation, hip strength, ankle strength, vaso for swelling    PT Home Exercise Plan  quad set, standing weight shifting for involuntary quad activation, hip abduction, heel raise (in brace), heel slides with strap    Consulted and Agree with Plan of Care  Patient       Patient will benefit from skilled therapeutic intervention in order to improve the following deficits and impairments:  Abnormal gait, Pain, Decreased strength, Increased fascial restricitons, Increased edema, Decreased balance, Decreased endurance, Improper body mechanics, Postural dysfunction, Decreased range of motion  Visit Diagnosis: Acute pain of right knee  Stiffness of right knee, not elsewhere classified  Muscle weakness (generalized)  Localized edema  Other abnormalities of gait and mobility     Problem List Patient Active Problem List   Diagnosis Date Noted  . Annual physical exam 12/23/2016  . Acute meniscal tear of right knee 02/27/2015  . Right foot pain 01/19/2013   Starr Lake PT, DPT, LAT, ATC  02/17/18  1:10 PM      [facility] [address] [place], [place], [address] Phone: [phone]   Fax:  [phone]  Name: Joshua Gaines MRN: [id number] Date of Birth: 01/08/1981

## 2018-02-21 ENCOUNTER — Encounter: Payer: Self-pay | Admitting: Physical Therapy

## 2018-02-21 ENCOUNTER — Ambulatory Visit: Payer: 59 | Admitting: Physical Therapy

## 2018-02-21 DIAGNOSIS — M25561 Pain in right knee: Secondary | ICD-10-CM

## 2018-02-21 DIAGNOSIS — M25661 Stiffness of right knee, not elsewhere classified: Secondary | ICD-10-CM

## 2018-02-21 DIAGNOSIS — R2689 Other abnormalities of gait and mobility: Secondary | ICD-10-CM | POA: Diagnosis not present

## 2018-02-21 DIAGNOSIS — M6281 Muscle weakness (generalized): Secondary | ICD-10-CM

## 2018-02-21 DIAGNOSIS — R600 Localized edema: Secondary | ICD-10-CM

## 2018-02-21 NOTE — Therapy (Signed)
[place], [place], [address] Phone: [phone]   Fax:  [phone]  Physical Therapy Treatment  Patient Details  Name: Joshua Gaines MRN: [id number] Date of Birth: 1980/12/09 Referring Provider (PT): Ophelia Charter MD   Encounter Date: 02/21/2018  PT End of Session - 02/21/18 1326    Visit Number  3    Number of Visits  17    Date for PT Re-Evaluation  04/11/18    Authorization Type  [facility]    PT Start Time  0102    PT Stop Time  1423    PT Time Calculation (min)  55 min    Activity Tolerance  Patient tolerated treatment well    Behavior During Therapy  [facility] for tasks assessed/performed       Past Medical History:  Diagnosis Date  . GERD (gastroesophageal reflux disease)     Past Surgical History:  Procedure Laterality Date  . ANTERIOR CRUCIATE LIGAMENT REPAIR  [id number]  . CHONDROPLASTY Right 02/01/2018   Procedure: CHONDROPLASTY;  Surgeon: Hiram Gash, MD;  Location: [place];  Service: Orthopedics;  Laterality: Right;  . HARDWARE REMOVAL Right 02/01/2018   Procedure: HARDWARE REMOVAL;  Surgeon: Hiram Gash, MD;  Location: [place];  Service: Orthopedics;  Laterality: Right;  . HERNIA REPAIR    . KNEE ARTHROSCOPY WITH ANTERIOR CRUCIATE LIGAMENT (ACL) REPAIR WITH HAMSTRING GRAFT Right 02/01/2018   Procedure: RIGHT KNEE ARTHROSCOPY WITH POSSIBLE REVISION ANTERIOR CRUCIATE LIGAMENT (ACL) REPAIR WITH AUTOGRAFT HAMSTRING;  Surgeon: Hiram Gash, MD;  Location: [place];  Service: Orthopedics;  Laterality: Right;  . KNEE ARTHROSCOPY WITH LATERAL MENISECTOMY Right 02/01/2018   Procedure: RIGHT KNEE ARTHROSCOPY WITH LATERAL MENISECTOMY;  Surgeon: Hiram Gash, MD;  Location: [place];  Service: Orthopedics;  Laterality: Right;  . KNEE ARTHROSCOPY WITH MEDIAL MENISECTOMY Right 02/01/2018   Procedure: RIGHT KNEE ARTHROSCOPY WITH MEDIAL  MENISECTOMY, CHONDROPLASTY;  Surgeon: Hiram Gash, MD;  Location: [place];  Service: Orthopedics;  Laterality: Right;  . MENISCUS REPAIR     left knee    There were no vitals filed for this visit.  Subjective Assessment - 02/21/18 1332    Subjective  "I am doing pretty good today, still using the brace with walking"     Currently in Pain?  Yes    Pain Score  3     Pain Location  Knee    Pain Orientation  Right    Pain Descriptors / Indicators  Aching;Sore    Pain Type  Surgical pain         [facility] PT Assessment - 02/21/18 1339      AROM   Right Knee Extension  12    Right Knee Flexion  71                   [facility] Adult PT Treatment/Exercise - 02/21/18 1346      Knee/Hip Exercises: Stretches   Active Hamstring Stretch  3 reps;30 seconds   PNF contract/ relax with 10 sec hold      Knee/Hip Exercises: Aerobic   Recumbent Bike  1/2 revolutions x 6 min       Knee/Hip Exercises: Seated   Heel Slides  2 sets;5 reps   holding at end range for 10 sec then scooting bottom forward     Knee/Hip Exercises: Supine   Short Arc Quad Sets  1  set;10 reps    Heel Slides  Right;1 set   with strap x 12 reps   Heel Slides Limitations  cues to use hamstring to end of available AROM an dusing strap to pull knee to end of     Bridges  2 sets;15 reps   with knees on bolster with end range isometric hold x 2 sec   Straight Leg Raises  10 reps;2 sets   combined with sustained quad set      Vasopneumatic   Number Minutes Vasopneumatic   15 minutes    Vasopnuematic Location   Knee    Vasopneumatic Pressure  Medium    Vasopneumatic Temperature   34             PT Education - 02/21/18 1415    Education Details  updated hep for SLR combined with quad set    Person(s) Educated  Patient    Methods  Explanation;Verbal cues;Demonstration    Comprehension  Verbalized understanding;Verbal cues required;Returned demonstration       PT Short Term Goals -  02/14/18 1755      PT SHORT TERM GOAL #1   Title  pt to be I with inital HEp     Time  4    Period  Weeks    Status  New    Target Date  03/14/18      PT SHORT TERM GOAL #2   Title  pt to verbalize and demo techniques to reduce pain and inflammation via RICE and HEP    Time  4    Period  Weeks    Status  New    Target Date  03/14/18      PT SHORT TERM GOAL #3   Title  pt to demo good quad activation and SLR with </= 10 degree quad lag for functional progression    Time  4    Period  Weeks    Status  New    Target Date  03/14/18      PT SHORT TERM GOAL #4   Title  pt to increase knee flexion to >/= 90 degrees with </= 4/10 pain for therapuetic progression     Time  4    Period  Weeks    Status  New    Target Date  03/14/18        PT Long Term Goals - 02/14/18 1757      PT LONG TERM GOAL #1   Title  increase ROM to </= 2 degrees and >/= 120 degrees with </= 1/10 pain for functional ROM required for efficent gait and ADLs    Time  8    Period  Weeks    Status  New    Target Date  04/11/18      PT LONG TERM GOAL #2   Title  pt to demo RLE strength to >/= 4+/5 in all planes to promote knee stability with standing/ walking and dynamic activities    Time  8    Period  Weeks    Status  New    Target Date  04/11/18      PT LONG TERM GOAL #3   Title  pt to be able to perfor dynamic activities and jogging/ hopping with no report of pain or instability for personal goal of returning to playing basketball     Time  8    Period  Weeks    Status  New    Target Date  04/11/18      PT LONG TERM GOAL #4   Title  increase FOTO score to </= 34% limited to demo improvement in function    Time  8    Period  Weeks    Status  New    Target Date  04/11/18      PT LONG TERM GOAL #5   Title  pt to be I with all HEP given as of last visit to maintain and progress current level of function independently.     Time  8    Period  Weeks    Status  New    Target Date  04/11/18             Plan - 02/21/18 1415    Clinical Impression Statement  pt noted improvement in soreness compared to previous session. Continued working knee ROM which he is improving both extension and flexion. continued hip / quad strengthening. continued Vaso for pain and inflammation.     PT Next Visit Plan  update HEP PRN, bike knee ROM, quad activation, hip strength, ankle strength, vaso for swelling    PT Home Exercise Plan  quad set, standing weight shifting for involuntary quad activation, hip abduction, heel raise (in brace), heel slides with strap    Consulted and Agree with Plan of Care  Patient       Patient will benefit from skilled therapeutic intervention in order to improve the following deficits and impairments:  Abnormal gait, Pain, Decreased strength, Increased fascial restricitons, Increased edema, Decreased balance, Decreased endurance, Improper body mechanics, Postural dysfunction, Decreased range of motion  Visit Diagnosis: Acute pain of right knee  Stiffness of right knee, not elsewhere classified  Muscle weakness (generalized)  Localized edema  Other abnormalities of gait and mobility     Problem List Patient Active Problem List   Diagnosis Date Noted  . Annual physical exam 12/23/2016  . Acute meniscal tear of right knee 02/27/2015  . Right foot pain 01/19/2013   Starr Lake PT, DPT, LAT, ATC  02/21/18  2:18 PM      [facility] [address] [place], [place], [address] Phone: [phone]   Fax:  [phone]  Name: Joshua Gaines MRN: [id number] Date of Birth: 1980-06-15

## 2018-02-22 ENCOUNTER — Ambulatory Visit (INDEPENDENT_AMBULATORY_CARE_PROVIDER_SITE_OTHER): Payer: 59 | Admitting: Family Medicine

## 2018-02-22 DIAGNOSIS — Z23 Encounter for immunization: Secondary | ICD-10-CM

## 2018-02-23 ENCOUNTER — Ambulatory Visit: Payer: 59 | Admitting: Physical Therapy

## 2018-02-23 ENCOUNTER — Encounter: Payer: Self-pay | Admitting: Physical Therapy

## 2018-02-23 DIAGNOSIS — R2689 Other abnormalities of gait and mobility: Secondary | ICD-10-CM | POA: Diagnosis not present

## 2018-02-23 DIAGNOSIS — M6281 Muscle weakness (generalized): Secondary | ICD-10-CM | POA: Diagnosis not present

## 2018-02-23 DIAGNOSIS — M25661 Stiffness of right knee, not elsewhere classified: Secondary | ICD-10-CM | POA: Diagnosis not present

## 2018-02-23 DIAGNOSIS — R600 Localized edema: Secondary | ICD-10-CM

## 2018-02-23 DIAGNOSIS — M25561 Pain in right knee: Secondary | ICD-10-CM

## 2018-02-23 NOTE — Therapy (Signed)
[place], [place], [address] Phone: [phone]   Fax:  [phone]  Physical Therapy Treatment  Patient Details  Name: Joshua Gaines MRN: [id number] Date of Birth: 09-09-1980 Referring Provider (PT): Ophelia Charter MD   Encounter Date: 02/23/2018  PT End of Session - 02/23/18 1419    Visit Number  4    Number of Visits  17    Date for PT Re-Evaluation  04/11/18    Authorization Type  [facility]    PT Start Time  1330    PT Stop Time  1425    PT Time Calculation (min)  55 min    Activity Tolerance  Patient tolerated treatment well    Behavior During Therapy  [facility] for tasks assessed/performed       Past Medical History:  Diagnosis Date  . GERD (gastroesophageal reflux disease)     Past Surgical History:  Procedure Laterality Date  . ANTERIOR CRUCIATE LIGAMENT REPAIR  [id number]  . CHONDROPLASTY Right 02/01/2018   Procedure: CHONDROPLASTY;  Surgeon: Hiram Gash, MD;  Location: [place];  Service: Orthopedics;  Laterality: Right;  . HARDWARE REMOVAL Right 02/01/2018   Procedure: HARDWARE REMOVAL;  Surgeon: Hiram Gash, MD;  Location: [place];  Service: Orthopedics;  Laterality: Right;  . HERNIA REPAIR    . KNEE ARTHROSCOPY WITH ANTERIOR CRUCIATE LIGAMENT (ACL) REPAIR WITH HAMSTRING GRAFT Right 02/01/2018   Procedure: RIGHT KNEE ARTHROSCOPY WITH POSSIBLE REVISION ANTERIOR CRUCIATE LIGAMENT (ACL) REPAIR WITH AUTOGRAFT HAMSTRING;  Surgeon: Hiram Gash, MD;  Location: [place];  Service: Orthopedics;  Laterality: Right;  . KNEE ARTHROSCOPY WITH LATERAL MENISECTOMY Right 02/01/2018   Procedure: RIGHT KNEE ARTHROSCOPY WITH LATERAL MENISECTOMY;  Surgeon: Hiram Gash, MD;  Location: [place];  Service: Orthopedics;  Laterality: Right;  . KNEE ARTHROSCOPY WITH MEDIAL MENISECTOMY Right 02/01/2018   Procedure: RIGHT KNEE ARTHROSCOPY WITH MEDIAL  MENISECTOMY, CHONDROPLASTY;  Surgeon: Hiram Gash, MD;  Location: [place];  Service: Orthopedics;  Laterality: Right;  . MENISCUS REPAIR     left knee    There were no vitals filed for this visit.  Subjective Assessment - 02/23/18 1336    Subjective  " I've got a couple different things going on today with my neck and other areas, my knee is at about a 4/10 today"     Currently in Pain?  Yes    Pain Score  4     Pain Location  Knee    Pain Orientation  Right    Pain Descriptors / Indicators  Sore;Aching    Pain Onset  More than a month ago    Pain Frequency  Intermittent    Aggravating Factors   sit to standing, prlonged stnading    Pain Relieving Factors  swinging the leg         [facility] PT Assessment - 02/23/18 0001      AROM   Right Knee Extension  5   3 following manua;l   Right Knee Flexion  81      PROM   Right Knee Flexion  89                   [facility] Adult PT Treatment/Exercise - 02/23/18 1337      Exercises   Exercises  Knee/Hip      Knee/Hip Exercises: Aerobic   Recumbent Bike  1/2 revolutions x 6  min    able to get 3 full revolutions around with mild hip hiking     Knee/Hip Exercises: Supine   Short Arc Quad Sets  2 sets;10 reps   monitoring fo rpain   Heel Slides  Right;1 set;10 reps    Heel Slides Limitations  actively bending knee until availble ROM, then progress with bil UE    Straight Leg Raises  10 reps;2 sets   combined with quad set     Knee/Hip Exercises: Prone   Hip Extension  1 set;20 reps   with oscillations avoiding resting to fatigue glute   Other Prone Exercises  prone hang x 59min       Vasopneumatic   Number Minutes Vasopneumatic   15 minutes    Vasopnuematic Location   Knee    Vasopneumatic Pressure  Medium    Vasopneumatic Temperature   34      Manual Therapy   Manual therapy comments  MTPR along the vastus lateralis and rectus femoris    Joint Mobilization  patellar superior/ inferior mobs   grade 3             PT Education - 02/23/18 1419    Education Details  updated HEP for SAQ with specific cues to monitor for pain and stop it becomes painful    Person(s) Educated  Patient    Methods  Explanation;Verbal cues;Handout;Demonstration    Comprehension  Verbalized understanding;Verbal cues required;Returned demonstration       PT Short Term Goals - 02/14/18 1755      PT SHORT TERM GOAL #1   Title  pt to be I with inital HEp     Time  4    Period  Weeks    Status  New    Target Date  03/14/18      PT SHORT TERM GOAL #2   Title  pt to verbalize and demo techniques to reduce pain and inflammation via RICE and HEP    Time  4    Period  Weeks    Status  New    Target Date  03/14/18      PT SHORT TERM GOAL #3   Title  pt to demo good quad activation and SLR with </= 10 degree quad lag for functional progression    Time  4    Period  Weeks    Status  New    Target Date  03/14/18      PT SHORT TERM GOAL #4   Title  pt to increase knee flexion to >/= 90 degrees with </= 4/10 pain for therapuetic progression     Time  4    Period  Weeks    Status  New    Target Date  03/14/18        PT Long Term Goals - 02/14/18 1757      PT LONG TERM GOAL #1   Title  increase ROM to </= 2 degrees and >/= 120 degrees with </= 1/10 pain for functional ROM required for efficent gait and ADLs    Time  8    Period  Weeks    Status  New    Target Date  04/11/18      PT LONG TERM GOAL #2   Title  pt to demo RLE strength to >/= 4+/5 in all planes to promote knee stability with standing/ walking and dynamic activities    Time  8    Period  Weeks  Status  New    Target Date  04/11/18      PT LONG TERM GOAL #3   Title  pt to be able to perfor dynamic activities and jogging/ hopping with no report of pain or instability for personal goal of returning to playing basketball     Time  8    Period  Weeks    Status  New    Target Date  04/11/18      PT LONG TERM GOAL #4    Title  increase FOTO score to </= 34% limited to demo improvement in function    Time  8    Period  Weeks    Status  New    Target Date  04/11/18      PT LONG TERM GOAL #5   Title  pt to be I with all HEP given as of last visit to maintain and progress current level of function independently.     Time  8    Period  Weeks    Status  New    Target Date  04/11/18            Plan - 02/23/18 1420    Clinical Impression Statement  Ctoninued working on knee ROM which he was able to get about 3 revolutions round on the bike. he continues to make good progress with ROM 3 to 81 degrees today. continued patellar mobs and gentle strengthening monitoring for pain. continued hip/ knee and ankle strengthening. continued vaso end of session.     PT Treatment/Interventions  ADLs/Self Care Home Management;Cryotherapy;Iontophoresis 4mg /ml Dexamethasone;Electrical Stimulation;Moist Heat;Ultrasound;Balance training;Neuromuscular re-education;Therapeutic activities;Therapeutic exercise;Manual techniques;Vasopneumatic Device;Gait training;Patient/family education;Taping    PT Next Visit Plan  update HEP PRN, bike knee ROM, quad activation, hip strength, ankle strength, vaso for swelling    PT Home Exercise Plan  quad set, standing weight shifting for involuntary quad activation, hip abduction, heel raise (in brace), heel slides with strap, SAQ    Consulted and Agree with Plan of Care  Patient       Patient will benefit from skilled therapeutic intervention in order to improve the following deficits and impairments:  Abnormal gait, Pain, Decreased strength, Increased fascial restricitons, Increased edema, Decreased balance, Decreased endurance, Improper body mechanics, Postural dysfunction, Decreased range of motion  Visit Diagnosis: Acute pain of right knee  Stiffness of right knee, not elsewhere classified  Muscle weakness (generalized)  Localized edema  Other abnormalities of gait and  mobility     Problem List Patient Active Problem List   Diagnosis Date Noted  . Annual physical exam 12/23/2016  . Acute meniscal tear of right knee 02/27/2015  . Right foot pain 01/19/2013   Starr Lake PT, DPT, LAT, ATC  02/23/18  2:25 PM      [facility] [address] [place], [place], [address] Phone: [phone]   Fax:  [phone]  Name: Joshua Gaines MRN: [id number] Date of Birth: 1980-09-12

## 2018-02-28 ENCOUNTER — Ambulatory Visit: Payer: 59 | Admitting: Physical Therapy

## 2018-02-28 ENCOUNTER — Encounter: Payer: Self-pay | Admitting: Physical Therapy

## 2018-02-28 DIAGNOSIS — M6281 Muscle weakness (generalized): Secondary | ICD-10-CM | POA: Diagnosis not present

## 2018-02-28 DIAGNOSIS — R600 Localized edema: Secondary | ICD-10-CM | POA: Diagnosis not present

## 2018-02-28 DIAGNOSIS — M25661 Stiffness of right knee, not elsewhere classified: Secondary | ICD-10-CM

## 2018-02-28 DIAGNOSIS — M25561 Pain in right knee: Secondary | ICD-10-CM | POA: Diagnosis not present

## 2018-02-28 DIAGNOSIS — R2689 Other abnormalities of gait and mobility: Secondary | ICD-10-CM

## 2018-02-28 NOTE — Therapy (Signed)
[place], [place], [address] Phone: [phone]   Fax:  [phone]  Physical Therapy Treatment  Patient Details  Name: Joshua Gaines MRN: [id number] Date of Birth: 07-10-1980 Referring Provider (PT): Ophelia Charter MD   Encounter Date: 02/28/2018  PT End of Session - 02/28/18 1542    Visit Number  5    Number of Visits  17    Date for PT Re-Evaluation  04/11/18    Authorization Type  [facility]    PT Start Time  2878    PT Stop Time  1640    PT Time Calculation (min)  55 min    Activity Tolerance  Patient tolerated treatment well    Behavior During Therapy  [facility] for tasks assessed/performed       Past Medical History:  Diagnosis Date  . GERD (gastroesophageal reflux disease)     Past Surgical History:  Procedure Laterality Date  . ANTERIOR CRUCIATE LIGAMENT REPAIR  [id number]  . CHONDROPLASTY Right 02/01/2018   Procedure: CHONDROPLASTY;  Surgeon: Hiram Gash, MD;  Location: [place];  Service: Orthopedics;  Laterality: Right;  . HARDWARE REMOVAL Right 02/01/2018   Procedure: HARDWARE REMOVAL;  Surgeon: Hiram Gash, MD;  Location: [place];  Service: Orthopedics;  Laterality: Right;  . HERNIA REPAIR    . KNEE ARTHROSCOPY WITH ANTERIOR CRUCIATE LIGAMENT (ACL) REPAIR WITH HAMSTRING GRAFT Right 02/01/2018   Procedure: RIGHT KNEE ARTHROSCOPY WITH POSSIBLE REVISION ANTERIOR CRUCIATE LIGAMENT (ACL) REPAIR WITH AUTOGRAFT HAMSTRING;  Surgeon: Hiram Gash, MD;  Location: [place];  Service: Orthopedics;  Laterality: Right;  . KNEE ARTHROSCOPY WITH LATERAL MENISECTOMY Right 02/01/2018   Procedure: RIGHT KNEE ARTHROSCOPY WITH LATERAL MENISECTOMY;  Surgeon: Hiram Gash, MD;  Location: [place];  Service: Orthopedics;  Laterality: Right;  . KNEE ARTHROSCOPY WITH MEDIAL MENISECTOMY Right 02/01/2018   Procedure: RIGHT KNEE ARTHROSCOPY WITH MEDIAL  MENISECTOMY, CHONDROPLASTY;  Surgeon: Hiram Gash, MD;  Location: [place];  Service: Orthopedics;  Laterality: Right;  . MENISCUS REPAIR     left knee    There were no vitals filed for this visit.  Subjective Assessment - 02/28/18 1542    Subjective  "i am doing pretty good today, the last of the steri-strips came off today"    Currently in Pain?  Yes    Pain Score  1          [facility] PT Assessment - 02/28/18 1559      AROM   Right Knee Extension  5    Right Knee Flexion  92                   [facility] Adult PT Treatment/Exercise - 02/28/18 1542      Exercises   Exercises  Knee/Hip      Lumbar Exercises: Supine   Dead Bug Limitations  dead bug 2 x 20 sliding bil UE up table, 2 x 20 alternating UE reaching      Knee/Hip Exercises: Stretches   Active Hamstring Stretch  2 reps;Right;30 seconds   PNF contract/ relax     Knee/Hip Exercises: Aerobic   Recumbent Bike  L1 x 6 min full revolutions    started backward x 3 min, forward x 3 min      Knee/Hip Exercises: Standing   Other Standing Knee Exercises  wall squat 2 x 10 using goniometer only going to 45  degrees per protocol      Knee/Hip Exercises: Seated   Heel Slides  1 set;10 reps;Right    Hamstring Curl  10 reps;2 sets;Right;Strengthening    Hamstring Limitations  yellow theraband      Knee/Hip Exercises: Supine   Heel Slides  1 set;15 reps   active ROM   Straight Leg Raises  1 set;20 reps   with quad set and controlled eccentric lowering     Vasopneumatic   Number Minutes Vasopneumatic   15 minutes    Vasopnuematic Location   Knee    Vasopneumatic Pressure  Medium    Vasopneumatic Temperature   34      Manual Therapy   Joint Mobilization  patellar superior/ inferior mobs  grade 3               PT Short Term Goals - 02/14/18 1755      PT SHORT TERM GOAL #1   Title  pt to be I with inital HEp     Time  4    Period  Weeks    Status  New    Target Date  03/14/18       PT SHORT TERM GOAL #2   Title  pt to verbalize and demo techniques to reduce pain and inflammation via RICE and HEP    Time  4    Period  Weeks    Status  New    Target Date  03/14/18      PT SHORT TERM GOAL #3   Title  pt to demo good quad activation and SLR with </= 10 degree quad lag for functional progression    Time  4    Period  Weeks    Status  New    Target Date  03/14/18      PT SHORT TERM GOAL #4   Title  pt to increase knee flexion to >/= 90 degrees with </= 4/10 pain for therapuetic progression     Time  4    Period  Weeks    Status  New    Target Date  03/14/18        PT Long Term Goals - 02/14/18 1757      PT LONG TERM GOAL #1   Title  increase ROM to </= 2 degrees and >/= 120 degrees with </= 1/10 pain for functional ROM required for efficent gait and ADLs    Time  8    Period  Weeks    Status  New    Target Date  04/11/18      PT LONG TERM GOAL #2   Title  pt to demo RLE strength to >/= 4+/5 in all planes to promote knee stability with standing/ walking and dynamic activities    Time  8    Period  Weeks    Status  New    Target Date  04/11/18      PT LONG TERM GOAL #3   Title  pt to be able to perfor dynamic activities and jogging/ hopping with no report of pain or instability for personal goal of returning to playing basketball     Time  8    Period  Weeks    Status  New    Target Date  04/11/18      PT LONG TERM GOAL #4   Title  increase FOTO score to </= 34% limited to demo improvement in function    Time  8  Period  Weeks    Status  New    Target Date  04/11/18      PT LONG TERM GOAL #5   Title  pt to be I with all HEP given as of last visit to maintain and progress current level of function independently.     Time  8    Period  Weeks    Status  New    Target Date  04/11/18            Plan - 02/28/18 1739    Clinical Impression Statement  Joshua Gaines reports being consistent with his HEP and notes minimal pain in the knee, He  continues to make good progress with ROM increased 5 to 91 today. continued hip/ core strength. He is able to [facility] SLR with no quad lag. progressed to standing wall squating allowing bend only to 45 degrees. continued vaso end of session.    PT Treatment/Interventions  ADLs/Self Care Home Management;Cryotherapy;Iontophoresis 4mg /ml Dexamethasone;Electrical Stimulation;Moist Heat;Ultrasound;Balance training;Neuromuscular re-education;Therapeutic activities;Therapeutic exercise;Manual techniques;Vasopneumatic Device;Gait training;Patient/family education;Taping    PT Next Visit Plan  4 weeks post op, update HEP PRN, bike knee ROM, quad activation, hip strength, ankle strength, vaso for swelling,     PT Home Exercise Plan  quad set, standing weight shifting for involuntary quad activation, hip abduction, heel raise (in brace), heel slides with strap, SAQ    Consulted and Agree with Plan of Care  Patient       Patient will benefit from skilled therapeutic intervention in order to improve the following deficits and impairments:  Abnormal gait, Pain, Decreased strength, Increased fascial restricitons, Increased edema, Decreased balance, Decreased endurance, Improper body mechanics, Postural dysfunction, Decreased range of motion  Visit Diagnosis: Acute pain of right knee  Stiffness of right knee, not elsewhere classified  Muscle weakness (generalized)  Localized edema  Other abnormalities of gait and mobility     Problem List Patient Active Problem List   Diagnosis Date Noted  . Annual physical exam 12/23/2016  . Acute meniscal tear of right knee 02/27/2015  . Right foot pain 01/19/2013   Starr Lake PT, DPT, LAT, ATC  02/28/18  5:43 PM      [facility] [address] [place], [place], [address] Phone: [phone]   Fax:  [phone]  Name: Joshua Gaines MRN: [id number] Date of Birth: 09-Apr-1981

## 2018-03-02 ENCOUNTER — Ambulatory Visit (INDEPENDENT_AMBULATORY_CARE_PROVIDER_SITE_OTHER): Payer: 59 | Admitting: Sports Medicine

## 2018-03-02 ENCOUNTER — Ambulatory Visit: Payer: 59 | Admitting: Physical Therapy

## 2018-03-02 DIAGNOSIS — M1712 Unilateral primary osteoarthritis, left knee: Secondary | ICD-10-CM

## 2018-03-02 NOTE — Progress Notes (Signed)
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 Griffin Basil and had a repeat ACL reconstruction.  Unfortunately he has been having more difficulty with his left knee, history of bucket-handle meniscal tear, we have had to unlock the knee a few times in the past, he is post partial meniscectomy, more recently developed new onset locking of the knee with inability to extend past 10 degrees.  Symptoms are severe, worsening.  His pain is lateral.  I reviewed the past medical history, family history, social history, surgical history, and allergies today and no changes were needed.  Please see the problem list section below in epic for further details.  Past Medical History: Past Medical History:  Diagnosis Date  . GERD (gastroesophageal reflux disease)    Past Surgical History: Past Surgical History:  Procedure Laterality Date  . ANTERIOR CRUCIATE LIGAMENT REPAIR  [id number]  . CHONDROPLASTY Right 02/01/2018   Procedure: CHONDROPLASTY;  Surgeon: Hiram Gash, MD;  Location: [place];  Service: Orthopedics;  Laterality: Right;  . HARDWARE REMOVAL Right 02/01/2018   Procedure: HARDWARE REMOVAL;  Surgeon: Hiram Gash, MD;  Location: [place];  Service: Orthopedics;  Laterality: Right;  . HERNIA REPAIR    . KNEE ARTHROSCOPY WITH ANTERIOR CRUCIATE LIGAMENT (ACL) REPAIR WITH HAMSTRING GRAFT Right 02/01/2018   Procedure: RIGHT KNEE ARTHROSCOPY WITH POSSIBLE REVISION ANTERIOR CRUCIATE LIGAMENT (ACL) REPAIR WITH AUTOGRAFT HAMSTRING;  Surgeon: Hiram Gash, MD;  Location: [place];  Service: Orthopedics;  Laterality: Right;  . KNEE ARTHROSCOPY WITH LATERAL MENISECTOMY Right 02/01/2018   Procedure: RIGHT KNEE ARTHROSCOPY WITH LATERAL MENISECTOMY;  Surgeon: Hiram Gash, MD;  Location: [place];  Service: Orthopedics;  Laterality: Right;  . KNEE ARTHROSCOPY WITH MEDIAL MENISECTOMY Right 02/01/2018   Procedure: RIGHT KNEE ARTHROSCOPY WITH MEDIAL MENISECTOMY, CHONDROPLASTY;  Surgeon: Hiram Gash, MD;  Location: [place];  Service: Orthopedics;  Laterality: Right;  . MENISCUS REPAIR     left knee   Social History: Social History   Socioeconomic History  . Marital status: Married    Spouse name: Not on file  . Number of children: Not on file  . Years of education: Not on file  . Highest education level: Not on file  Occupational History  . Not on file  Social Needs  . Financial resource strain: Not on file  . Food insecurity:    Worry: Not on file    Inability: Not on file  . Transportation needs:    Medical: Not on file    Non-medical: Not on file  Tobacco Use  . Smoking status: Never Smoker  . Smokeless tobacco: Never Used  Substance and Sexual Activity  . Alcohol use: No  . Drug use: No  . Sexual activity: Yes    Partners: Female  Lifestyle  . Physical activity:    Days per week: Not on file    Minutes per session: Not on file  . Stress: Not on file  Relationships  . Social connections:    Talks on phone: Not on file    Gets together: Not on file    Attends religious service: Not on file    Active member of club or organization: Not on file    Attends meetings of clubs  or organizations: Not on file    Relationship status: Not on file  Other Topics Concern  . Not on file  Social History Narrative  . Not on file   Family History: No family history on file. Allergies: No Known Allergies Medications: See med rec.  Review of Systems: No fevers, chills, night sweats, weight loss, chest pain, or shortness of breath.   Objective:    General: Well Developed, well nourished, and in no acute distress.  Neuro: Alert and oriented x3, extra-ocular muscles intact, sensation grossly intact.  HEENT: Normocephalic,  atraumatic, pupils equal round reactive to light, neck supple, no masses, no lymphadenopathy, thyroid nonpalpable.  Skin: Warm and dry, no rashes. Cardiac: Regular rate and rhythm, no murmurs rubs or gallops, no lower extremity edema.  Respiratory: Clear to auscultation bilaterally. Not using accessory muscles, speaking in full sentences. Left knee: Unable to extend past 10 degrees, mechanically locked.  Procedure: Real-time Ultrasound Guided Injection of left knee Device: GE Logiq E  Verbal informed consent obtained.  Time-out conducted.  Noted no overlying erythema, induration, or other signs of local infection.  Skin prepped in a sterile fashion.  Local anesthesia: Topical Ethyl chloride.  With sterile technique and under real time ultrasound guidance: 3 cc lidocaine, 3 cc bupivacaine injected easily into the knee Completed without difficulty  Pain immediately resolved suggesting accurate placement of the medication.  Advised to call if fevers/chills, erythema, induration, drainage, or persistent bleeding.  Images permanently stored and available for review in the ultrasound unit.  Impression: Technically successful ultrasound guided injection.  After waiting approximately 10 minutes I placed the patient prone, applied heavy varus stress to unload the lateral compartment, and then extended the knee, I was able to feel a palpable pop in the knee then regained full range of motion.  Impression and Recommendations:    Localized osteoarthritis of left knee Previous bucket-handle meniscal tear has been resected, still experiences some locking in extension. Intra-articular lidocaine placed with manipulation, he will need to continue to work on range of motion, and we can probably do a steroid injection at some point in the future. ___________________________________________ Gwen Her. Dianah Field, M.D., ABFM., CAQSM. Primary Care and Sports Medicine [facility]  [facility]  Adjunct Professor of [employer]

## 2018-03-02 NOTE — Assessment & Plan Note (Signed)
Previous bucket-handle meniscal tear has been resected, still experiences some locking in extension. Intra-articular lidocaine placed with manipulation, he will need to continue to work on range of motion, and we can probably do a steroid injection at some point in the future.

## 2018-03-03 ENCOUNTER — Encounter

## 2018-03-03 DIAGNOSIS — M2341 Loose body in knee, right knee: Secondary | ICD-10-CM | POA: Diagnosis not present

## 2018-03-07 ENCOUNTER — Encounter: Payer: Self-pay | Admitting: Physical Therapy

## 2018-03-07 ENCOUNTER — Ambulatory Visit: Payer: 59 | Attending: Orthopaedic Surgery | Admitting: Physical Therapy

## 2018-03-07 DIAGNOSIS — M25561 Pain in right knee: Secondary | ICD-10-CM | POA: Insufficient documentation

## 2018-03-07 DIAGNOSIS — R600 Localized edema: Secondary | ICD-10-CM | POA: Diagnosis not present

## 2018-03-07 DIAGNOSIS — M6281 Muscle weakness (generalized): Secondary | ICD-10-CM | POA: Insufficient documentation

## 2018-03-07 DIAGNOSIS — M25661 Stiffness of right knee, not elsewhere classified: Secondary | ICD-10-CM | POA: Diagnosis not present

## 2018-03-07 DIAGNOSIS — R2689 Other abnormalities of gait and mobility: Secondary | ICD-10-CM | POA: Insufficient documentation

## 2018-03-07 NOTE — Therapy (Signed)
[place], [place], [address] Phone: [phone]   Fax:  [phone]  Physical Therapy Treatment  Patient Details  Name: Joshua Gaines MRN: [id number] Date of Birth: [birth year] Referring Provider (PT): Ophelia Charter MD   Encounter Date: 03/07/2018  PT End of Session - 03/07/18 1720    Visit Number  6    Number of Visits  17    Date for PT Re-Evaluation  04/11/18    Authorization Type  [facility]    PT Start Time  1628    PT Stop Time  1722    PT Time Calculation (min)  54 min    Activity Tolerance  Patient tolerated treatment well    Behavior During Therapy  [facility] for tasks assessed/performed       Past Medical History:  Diagnosis Date  . GERD (gastroesophageal reflux disease)     Past Surgical History:  Procedure Laterality Date  . ANTERIOR CRUCIATE LIGAMENT REPAIR  [id number]  . CHONDROPLASTY Right 02/01/2018   Procedure: CHONDROPLASTY;  Surgeon: Hiram Gash, MD;  Location: [place];  Service: Orthopedics;  Laterality: Right;  . HARDWARE REMOVAL Right 02/01/2018   Procedure: HARDWARE REMOVAL;  Surgeon: Hiram Gash, MD;  Location: [place];  Service: Orthopedics;  Laterality: Right;  . HERNIA REPAIR    . KNEE ARTHROSCOPY WITH ANTERIOR CRUCIATE LIGAMENT (ACL) REPAIR WITH HAMSTRING GRAFT Right 02/01/2018   Procedure: RIGHT KNEE ARTHROSCOPY WITH POSSIBLE REVISION ANTERIOR CRUCIATE LIGAMENT (ACL) REPAIR WITH AUTOGRAFT HAMSTRING;  Surgeon: Hiram Gash, MD;  Location: [place];  Service: Orthopedics;  Laterality: Right;  . KNEE ARTHROSCOPY WITH LATERAL MENISECTOMY Right 02/01/2018   Procedure: RIGHT KNEE ARTHROSCOPY WITH LATERAL MENISECTOMY;  Surgeon: Hiram Gash, MD;  Location: [place];  Service: Orthopedics;  Laterality: Right;  . KNEE ARTHROSCOPY WITH MEDIAL MENISECTOMY Right 02/01/2018   Procedure: RIGHT KNEE ARTHROSCOPY WITH MEDIAL  MENISECTOMY, CHONDROPLASTY;  Surgeon: Hiram Gash, MD;  Location: [place];  Service: Orthopedics;  Laterality: Right;  . MENISCUS REPAIR     left knee    There were no vitals filed for this visit.  Subjective Assessment - 03/07/18 1636    Subjective  " My R knee is doing well, my L knee locked up on me and I had to see Dr. Darene Lamer to help unpop it unlock it. I saw dr Griffin Basil and he was happy with how the R knee is doing"    Patient Stated Goals  return back to normal as much as possible, playing basketball,     Currently in Pain?  No/denies    Aggravating Factors   N/A    Pain Relieving Factors  exercise         [facility] PT Assessment - 03/07/18 1637      AROM   Right Knee Extension  5    Right Knee Flexion  105                   [facility] Adult PT Treatment/Exercise - 03/07/18 0001      Exercises   Exercises  Knee/Hip      Knee/Hip Exercises: Stretches   Active Hamstring Stretch  2 reps;Right;30 seconds      Knee/Hip Exercises: Aerobic   Recumbent Bike  L1 x 4min full revolutions   gradually lowering bike seat to promote knee flexion     Knee/Hip Exercises: Standing  Forward Step Up  2 sets;Step Height: 4";10 reps   flexion measures at 48 degrees   Other Standing Knee Exercises  wall squat 2 x 10 using goniometer only going to 45 degrees per protocol   with green theraband around the back of the knee     Knee/Hip Exercises: Seated   Stool Scoot - Round Trips  4 x 50 ft using bil LE      Knee/Hip Exercises: Supine   Short Arc Quad Sets  2 sets;20 reps   2#   Straight Leg Raises  20 reps;2 sets   with quad set, 2# on 2nd set     Knee/Hip Exercises: Sidelying   Hip ABduction  1 set;20 reps   1 set circles going CW /CCW with 2#     Vasopneumatic   Number Minutes Vasopneumatic   10 minutes    Vasopnuematic Location   Knee    Vasopneumatic Pressure  Medium    Vasopneumatic Temperature   34               PT Short Term Goals -  02/14/18 1755      PT SHORT TERM GOAL #1   Title  pt to be I with inital HEp     Time  4    Period  Weeks    Status  New    Target Date  03/14/18      PT SHORT TERM GOAL #2   Title  pt to verbalize and demo techniques to reduce pain and inflammation via RICE and HEP    Time  4    Period  Weeks    Status  New    Target Date  03/14/18      PT SHORT TERM GOAL #3   Title  pt to demo good quad activation and SLR with </= 10 degree quad lag for functional progression    Time  4    Period  Weeks    Status  New    Target Date  03/14/18      PT SHORT TERM GOAL #4   Title  pt to increase knee flexion to >/= 90 degrees with </= 4/10 pain for therapuetic progression     Time  4    Period  Weeks    Status  New    Target Date  03/14/18        PT Long Term Goals - 02/14/18 1757      PT LONG TERM GOAL #1   Title  increase ROM to </= 2 degrees and >/= 120 degrees with </= 1/10 pain for functional ROM required for efficent gait and ADLs    Time  8    Period  Weeks    Status  New    Target Date  04/11/18      PT LONG TERM GOAL #2   Title  pt to demo RLE strength to >/= 4+/5 in all planes to promote knee stability with standing/ walking and dynamic activities    Time  8    Period  Weeks    Status  New    Target Date  04/11/18      PT LONG TERM GOAL #3   Title  pt to be able to perfor dynamic activities and jogging/ hopping with no report of pain or instability for personal goal of returning to playing basketball     Time  8    Period  Weeks    Status  New    Target Date  04/11/18      PT LONG TERM GOAL #4   Title  increase FOTO score to </= 34% limited to demo improvement in function    Time  8    Period  Weeks    Status  New    Target Date  04/11/18      PT LONG TERM GOAL #5   Title  pt to be I with all HEP given as of last visit to maintain and progress current level of function independently.     Time  8    Period  Weeks    Status  New    Target Date  04/11/18             Plan - 03/07/18 1720    Clinical Impression Statement  pt reports consistency with his exercises and has been still using the brace. He reports increased issues with his L knee with locking requiring a visit to his MD. continued working on stretching and ROM which he continues to make good progress with, continuing to montior for pain and following protocol. continued vaso end of session to calm down soreness.     PT Next Visit Plan  5weeks post op, update HEP PRN, bike knee ROM, quad activation, hip strength, ankle strength, vaso for swelling,     PT Home Exercise Plan  quad set, standing weight shifting for involuntary quad activation, hip abduction, heel raise (in brace), heel slides with strap, SAQ    Consulted and Agree with Plan of Care  Patient       Patient will benefit from skilled therapeutic intervention in order to improve the following deficits and impairments:  Abnormal gait, Pain, Decreased strength, Increased fascial restricitons, Increased edema, Decreased balance, Decreased endurance, Improper body mechanics, Postural dysfunction, Decreased range of motion  Visit Diagnosis: Acute pain of right knee  Stiffness of right knee, not elsewhere classified  Muscle weakness (generalized)  Localized edema  Other abnormalities of gait and mobility     Problem List Patient Active Problem List   Diagnosis Date Noted  . Localized osteoarthritis of left knee 03/02/2018  . Annual physical exam 12/23/2016  . Acute meniscal tear of right knee 02/27/2015  . Right foot pain 01/19/2013   Starr Lake PT, DPT, LAT, ATC  03/07/18  5:30 PM      [place], [place], [address] Phone: [phone]   Fax:  [phone]  Name: MUSTAPHA COLSON MRN: [id number] Date of Birth: 11/13/1980

## 2018-03-09 ENCOUNTER — Encounter: Payer: Self-pay | Admitting: Physical Therapy

## 2018-03-09 ENCOUNTER — Ambulatory Visit: Payer: 59 | Admitting: Physical Therapy

## 2018-03-09 DIAGNOSIS — M25561 Pain in right knee: Secondary | ICD-10-CM | POA: Diagnosis not present

## 2018-03-09 DIAGNOSIS — M6281 Muscle weakness (generalized): Secondary | ICD-10-CM

## 2018-03-09 DIAGNOSIS — R600 Localized edema: Secondary | ICD-10-CM

## 2018-03-09 DIAGNOSIS — R2689 Other abnormalities of gait and mobility: Secondary | ICD-10-CM

## 2018-03-09 DIAGNOSIS — M25661 Stiffness of right knee, not elsewhere classified: Secondary | ICD-10-CM | POA: Diagnosis not present

## 2018-03-09 NOTE — Therapy (Signed)
[place], [place], [address] Phone: [phone]   Fax:  [phone]  Physical Therapy Treatment  Patient Details  Name: Joshua Gaines MRN: [id number] Date of Birth: 20-Mar-1981 Referring Provider (PT): Ophelia Charter MD   Encounter Date: 03/09/2018  PT End of Session - 03/09/18 1503    Visit Number  7    Number of Visits  17    Date for PT Re-Evaluation  04/11/18    Authorization Type  [facility] [employer]    PT Start Time  0626   pt arrived 7 min late   PT Stop Time  1513    PT Time Calculation (min)  51 min    Activity Tolerance  Patient tolerated treatment well    Behavior During Therapy  [facility] for tasks assessed/performed       Past Medical History:  Diagnosis Date  . GERD (gastroesophageal reflux disease)     Past Surgical History:  Procedure Laterality Date  . ANTERIOR CRUCIATE LIGAMENT REPAIR  [id number]  . CHONDROPLASTY Right 02/01/2018   Procedure: CHONDROPLASTY;  Surgeon: Hiram Gash, MD;  Location: [place];  Service: Orthopedics;  Laterality: Right;  . HARDWARE REMOVAL Right 02/01/2018   Procedure: HARDWARE REMOVAL;  Surgeon: Hiram Gash, MD;  Location: [place];  Service: Orthopedics;  Laterality: Right;  . HERNIA REPAIR    . KNEE ARTHROSCOPY WITH ANTERIOR CRUCIATE LIGAMENT (ACL) REPAIR WITH HAMSTRING GRAFT Right 02/01/2018   Procedure: RIGHT KNEE ARTHROSCOPY WITH POSSIBLE REVISION ANTERIOR CRUCIATE LIGAMENT (ACL) REPAIR WITH AUTOGRAFT HAMSTRING;  Surgeon: Hiram Gash, MD;  Location: [place];  Service: Orthopedics;  Laterality: Right;  . KNEE ARTHROSCOPY WITH LATERAL MENISECTOMY Right 02/01/2018   Procedure: RIGHT KNEE ARTHROSCOPY WITH LATERAL MENISECTOMY;  Surgeon: Hiram Gash, MD;  Location: [place];  Service: Orthopedics;  Laterality: Right;  . KNEE ARTHROSCOPY WITH MEDIAL MENISECTOMY Right 02/01/2018   Procedure: RIGHT KNEE  ARTHROSCOPY WITH MEDIAL MENISECTOMY, CHONDROPLASTY;  Surgeon: Hiram Gash, MD;  Location: [place];  Service: Orthopedics;  Laterality: Right;  . MENISCUS REPAIR     left knee    There were no vitals filed for this visit.  Subjective Assessment - 03/09/18 1425    Subjective  " my knee is feeling more sore/ stiff today from doing alot of walkin g    Currently in Pain?  Yes    Pain Score  2     Pain Orientation  Right    Pain Descriptors / Indicators  Aching;Sore    Pain Type  Chronic pain;Surgical pain    Pain Onset  More than a month ago    Pain Frequency  Intermittent    Aggravating Factors   N/A    Pain Relieving Factors  exercises                       [facility] Adult PT Treatment/Exercise - 03/09/18 1427      Exercises   Exercises  Knee/Hip      Knee/Hip Exercises: Stretches   Active Hamstring Stretch  3 reps;30 seconds   PNF contract / relax 10 sec     Knee/Hip Exercises: Aerobic   Nustep  L5 x 67min LE only      Knee/Hip Exercises: Prone   Other Prone Exercises  hamstring curl with hip extension 2 x 15 with 3#      Vasopneumatic  Number Minutes Vasopneumatic   15 minutes    Vasopnuematic Location   Knee    Vasopneumatic Pressure  High    Vasopneumatic Temperature   34      Manual Therapy   Manual Therapy  Soft tissue mobilization;Other (comment)    Manual therapy comments  MTPR along semi-tendinosus / memrbanosus    Joint Mobilization  patellar mobs grade 4 in all planes, Tibiofemoral AP/ PA grade 3 in supine    Soft tissue mobilization  IASTM along distal hamstrings    Other Manual Therapy  scar tissue massage                PT Short Term Goals - 02/14/18 1755      PT SHORT TERM GOAL #1   Title  pt to be I with inital HEp     Time  4    Period  Weeks    Status  New    Target Date  03/14/18      PT SHORT TERM GOAL #2   Title  pt to verbalize and demo techniques to reduce pain and inflammation via RICE and HEP     Time  4    Period  Weeks    Status  New    Target Date  03/14/18      PT SHORT TERM GOAL #3   Title  pt to demo good quad activation and SLR with </= 10 degree quad lag for functional progression    Time  4    Period  Weeks    Status  New    Target Date  03/14/18      PT SHORT TERM GOAL #4   Title  pt to increase knee flexion to >/= 90 degrees with </= 4/10 pain for therapuetic progression     Time  4    Period  Weeks    Status  New    Target Date  03/14/18        PT Long Term Goals - 02/14/18 1757      PT LONG TERM GOAL #1   Title  increase ROM to </= 2 degrees and >/= 120 degrees with </= 1/10 pain for functional ROM required for efficent gait and ADLs    Time  8    Period  Weeks    Status  New    Target Date  04/11/18      PT LONG TERM GOAL #2   Title  pt to demo RLE strength to >/= 4+/5 in all planes to promote knee stability with standing/ walking and dynamic activities    Time  8    Period  Weeks    Status  New    Target Date  04/11/18      PT LONG TERM GOAL #3   Title  pt to be able to perfor dynamic activities and jogging/ hopping with no report of pain or instability for personal goal of returning to playing basketball     Time  8    Period  Weeks    Status  New    Target Date  04/11/18      PT LONG TERM GOAL #4   Title  increase FOTO score to </= 34% limited to demo improvement in function    Time  8    Period  Weeks    Status  New    Target Date  04/11/18      PT LONG TERM GOAL #5  Title  pt to be I with all HEP given as of last visit to maintain and progress current level of function independently.     Time  8    Period  Weeks    Status  New    Target Date  04/11/18            Plan - 03/09/18 1504    Clinical Impression Statement  pt arrived 7 min late today. he reported increased soreness in the R knee since the last session but also attributes it to walking alot with work today. focused on mobs to improve flexion and scar tissue  massage, and DTM along the hamstrings. continued vaso end of session to calm down soreness and swelling.     PT Treatment/Interventions  ADLs/Self Care Home Management;Cryotherapy;Iontophoresis 4mg /ml Dexamethasone;Electrical Stimulation;Moist Heat;Ultrasound;Balance training;Neuromuscular re-education;Therapeutic activities;Therapeutic exercise;Manual techniques;Vasopneumatic Device;Gait training;Patient/family education;Taping    PT Next Visit Plan  5weeks post op, update HEP PRN, bike knee ROM, quad activation, hip strength, ankle strength, vaso for swelling,     PT Home Exercise Plan  quad set, standing weight shifting for involuntary quad activation, hip abduction, heel raise (in brace), heel slides with strap, SAQ    Consulted and Agree with Plan of Care  Patient       Patient will benefit from skilled therapeutic intervention in order to improve the following deficits and impairments:  Abnormal gait, Pain, Decreased strength, Increased fascial restricitons, Increased edema, Decreased balance, Decreased endurance, Improper body mechanics, Postural dysfunction, Decreased range of motion  Visit Diagnosis: Acute pain of right knee  Stiffness of right knee, not elsewhere classified  Muscle weakness (generalized)  Localized edema  Other abnormalities of gait and mobility     Problem List Patient Active Problem List   Diagnosis Date Noted  . Localized osteoarthritis of left knee 03/02/2018  . Annual physical exam 12/23/2016  . Acute meniscal tear of right knee 02/27/2015  . Right foot pain 01/19/2013   Starr Lake PT, DPT, LAT, ATC  03/09/18  3:11 PM      [facility] [address] [place], [place], [address] Phone: [phone]   Fax:  [phone]  Name: Joshua Gaines MRN: [id number] Date of Birth: 10-20-1980

## 2018-03-14 ENCOUNTER — Ambulatory Visit: Payer: 59 | Admitting: Physical Therapy

## 2018-03-14 ENCOUNTER — Encounter: Payer: Self-pay | Admitting: Physical Therapy

## 2018-03-14 DIAGNOSIS — M25661 Stiffness of right knee, not elsewhere classified: Secondary | ICD-10-CM | POA: Diagnosis not present

## 2018-03-14 DIAGNOSIS — R2689 Other abnormalities of gait and mobility: Secondary | ICD-10-CM

## 2018-03-14 DIAGNOSIS — R600 Localized edema: Secondary | ICD-10-CM

## 2018-03-14 DIAGNOSIS — M6281 Muscle weakness (generalized): Secondary | ICD-10-CM

## 2018-03-14 DIAGNOSIS — M25561 Pain in right knee: Secondary | ICD-10-CM | POA: Diagnosis not present

## 2018-03-14 NOTE — Therapy (Signed)
[place], [place], [address] Phone: [phone]   Fax:  [phone]  Physical Therapy Treatment  Patient Details  Name: Joshua Gaines MRN: [id number] Date of Birth: 24-Aug-1980 Referring Provider (PT): Ophelia Charter MD   Encounter Date: 03/14/2018  PT End of Session - 03/14/18 1719    Visit Number  8    Number of Visits  17    Date for PT Re-Evaluation  04/11/18    Authorization Type  [facility]    PT Start Time  1630    PT Stop Time  1725    PT Time Calculation (min)  55 min    Activity Tolerance  Patient tolerated treatment well    Behavior During Therapy  [facility] for tasks assessed/performed       Past Medical History:  Diagnosis Date  . GERD (gastroesophageal reflux disease)     Past Surgical History:  Procedure Laterality Date  . ANTERIOR CRUCIATE LIGAMENT REPAIR  [id number]  . CHONDROPLASTY Right 02/01/2018   Procedure: CHONDROPLASTY;  Surgeon: Hiram Gash, MD;  Location: [place];  Service: Orthopedics;  Laterality: Right;  . HARDWARE REMOVAL Right 02/01/2018   Procedure: HARDWARE REMOVAL;  Surgeon: Hiram Gash, MD;  Location: [place];  Service: Orthopedics;  Laterality: Right;  . HERNIA REPAIR    . KNEE ARTHROSCOPY WITH ANTERIOR CRUCIATE LIGAMENT (ACL) REPAIR WITH HAMSTRING GRAFT Right 02/01/2018   Procedure: RIGHT KNEE ARTHROSCOPY WITH POSSIBLE REVISION ANTERIOR CRUCIATE LIGAMENT (ACL) REPAIR WITH AUTOGRAFT HAMSTRING;  Surgeon: Hiram Gash, MD;  Location: [place];  Service: Orthopedics;  Laterality: Right;  . KNEE ARTHROSCOPY WITH LATERAL MENISECTOMY Right 02/01/2018   Procedure: RIGHT KNEE ARTHROSCOPY WITH LATERAL MENISECTOMY;  Surgeon: Hiram Gash, MD;  Location: [place];  Service: Orthopedics;  Laterality: Right;  . KNEE ARTHROSCOPY WITH MEDIAL MENISECTOMY Right 02/01/2018   Procedure: RIGHT KNEE ARTHROSCOPY WITH MEDIAL  MENISECTOMY, CHONDROPLASTY;  Surgeon: Hiram Gash, MD;  Location: [place];  Service: Orthopedics;  Laterality: Right;  . MENISCUS REPAIR     left knee    There were no vitals filed for this visit.  Subjective Assessment - 03/14/18 1633    Subjective  "I didn't bring my brace with me today, I was at gym working on the bike"    Patient Stated Goals  return back to normal as much as possible, playing basketball,     Currently in Pain?  No/denies    Pain Onset  More than a month ago    Pain Frequency  Occasional         [facility] PT Assessment - 03/14/18 1640      AROM   Right Knee Extension  8    Right Knee Flexion  105                   [facility] Adult PT Treatment/Exercise - 03/14/18 0001      Exercises   Exercises  Knee/Hip      Knee/Hip Exercises: Stretches   Active Hamstring Stretch  2 reps;30 seconds    Quad Stretch  2 reps;30 seconds      Knee/Hip Exercises: Aerobic   Recumbent Bike  L3 x 6 min    lowering seat every 2 min      Knee/Hip Exercises: Machines for Strengthening   Cybex Leg Press  40# bil LE 2 x 15   avoiding bending beyond  90 degrees     Knee/Hip Exercises: Seated   Long Arc Quad  2 sets;15 reps;Right    Stool Scoot - Round Trips  4 x 50 ft using bil LE      Knee/Hip Exercises: Supine   Bridges  2 sets;15 reps;Both;Strengthening      Vasopneumatic   Number Minutes Vasopneumatic   15 minutes    Vasopnuematic Location   Knee    Vasopneumatic Pressure  High    Vasopneumatic Temperature   34      Manual Therapy   Joint Mobilization  patellar mobs grade 4 in all planes, Tibiofemoral AP/ PA grade 3 in supine          Balance Exercises - 03/14/18 1711      Balance Exercises: Standing   Standing Eyes Opened  Narrow base of support (BOS);1 rep;30 secs    Standing Eyes Closed  Narrow base of support (BOS);1 rep;30 secs    Tandem Stance  Eyes open;Eyes closed;4 reps;30 secs   alternating          PT Short Term  Goals - 03/14/18 1724      PT SHORT TERM GOAL #1   Title  pt to be I with inital HEp     Time  4    Period  Weeks    Status  Achieved      PT SHORT TERM GOAL #2   Title  pt to verbalize and demo techniques to reduce pain and inflammation via RICE and HEP    Time  4    Period  Weeks    Status  Achieved      PT SHORT TERM GOAL #3   Title  pt to demo good quad activation and SLR with </= 10 degree quad lag for functional progression    Time  4    Period  Weeks    Status  On-going      PT SHORT TERM GOAL #4   Title  pt to increase knee flexion to >/= 90 degrees with </= 4/10 pain for therapuetic progression     Time  4    Period  Weeks    Status  Achieved        PT Long Term Goals - 02/14/18 1757      PT LONG TERM GOAL #1   Title  increase ROM to </= 2 degrees and >/= 120 degrees with </= 1/10 pain for functional ROM required for efficent gait and ADLs    Time  8    Period  Weeks    Status  New    Target Date  04/11/18      PT LONG TERM GOAL #2   Title  pt to demo RLE strength to >/= 4+/5 in all planes to promote knee stability with standing/ walking and dynamic activities    Time  8    Period  Weeks    Status  New    Target Date  04/11/18      PT LONG TERM GOAL #3   Title  pt to be able to perfor dynamic activities and jogging/ hopping with no report of pain or instability for personal goal of returning to playing basketball     Time  8    Period  Weeks    Status  New    Target Date  04/11/18      PT LONG TERM GOAL #4   Title  increase FOTO score to </= 34%  limited to demo improvement in function    Time  8    Period  Weeks    Status  New    Target Date  04/11/18      PT LONG TERM GOAL #5   Title  pt to be I with all HEP given as of last visit to maintain and progress current level of function independently.     Time  8    Period  Weeks    Status  New    Target Date  04/11/18            Plan - 03/14/18 1720    Clinical Impression Statement  pt  reports he has been doing exercise at the gym with the recumbent bike to increase ROM. he has weaned from the brace reporting no pain just stiffness. He did demonstrate some regression of extension and maintained flexion which is likley due to swelling. continued vaso for swelling and pain.     PT Next Visit Plan  5weeks post op, update HEP PRN, bike knee ROM, quad activation, hip strength, ankle strength, vaso for swelling,     PT Home Exercise Plan  quad set, standing weight shifting for involuntary quad activation, hip abduction, heel raise (in brace), heel slides with strap, SAQ    Consulted and Agree with Plan of Care  Patient       Patient will benefit from skilled therapeutic intervention in order to improve the following deficits and impairments:  Abnormal gait, Pain, Decreased strength, Increased fascial restricitons, Increased edema, Decreased balance, Decreased endurance, Improper body mechanics, Postural dysfunction, Decreased range of motion  Visit Diagnosis: Acute pain of right knee  Stiffness of right knee, not elsewhere classified  Muscle weakness (generalized)  Localized edema  Other abnormalities of gait and mobility     Problem List Patient Active Problem List   Diagnosis Date Noted  . Localized osteoarthritis of left knee 03/02/2018  . Annual physical exam 12/23/2016  . Acute meniscal tear of right knee 02/27/2015  . Right foot pain 01/19/2013   Starr Lake PT, DPT, LAT, ATC  03/14/18  5:30 PM      [place], [place], [address] Phone: [phone]   Fax:  [phone]  Name: Joshua Gaines MRN: [id number] Date of Birth: 11-16-1980

## 2018-03-16 ENCOUNTER — Ambulatory Visit: Payer: 59 | Admitting: Physical Therapy

## 2018-03-16 ENCOUNTER — Encounter: Payer: Self-pay | Admitting: Physical Therapy

## 2018-03-16 DIAGNOSIS — R2689 Other abnormalities of gait and mobility: Secondary | ICD-10-CM | POA: Diagnosis not present

## 2018-03-16 DIAGNOSIS — M25561 Pain in right knee: Secondary | ICD-10-CM

## 2018-03-16 DIAGNOSIS — R600 Localized edema: Secondary | ICD-10-CM

## 2018-03-16 DIAGNOSIS — M6281 Muscle weakness (generalized): Secondary | ICD-10-CM | POA: Diagnosis not present

## 2018-03-16 DIAGNOSIS — M25661 Stiffness of right knee, not elsewhere classified: Secondary | ICD-10-CM

## 2018-03-16 NOTE — Therapy (Signed)
[place], [place], [address] Phone: [phone]   Fax:  [phone]  Physical Therapy Treatment  Patient Details  Name: Joshua Gaines MRN: [id number] Date of Birth: 1980-11-04 Referring Provider (PT): Ophelia Charter MD   Encounter Date: 03/16/2018  PT End of Session - 03/16/18 1631    Visit Number  9    Number of Visits  17    Date for PT Re-Evaluation  04/11/18    Authorization Type  [facility]    PT Start Time  1632    PT Stop Time  1728    PT Time Calculation (min)  56 min    Activity Tolerance  Patient tolerated treatment well    Behavior During Therapy  [facility] for tasks assessed/performed       Past Medical History:  Diagnosis Date  . GERD (gastroesophageal reflux disease)     Past Surgical History:  Procedure Laterality Date  . ANTERIOR CRUCIATE LIGAMENT REPAIR  [id number]  . CHONDROPLASTY Right 02/01/2018   Procedure: CHONDROPLASTY;  Surgeon: Hiram Gash, MD;  Location: [place];  Service: Orthopedics;  Laterality: Right;  . HARDWARE REMOVAL Right 02/01/2018   Procedure: HARDWARE REMOVAL;  Surgeon: Hiram Gash, MD;  Location: [place];  Service: Orthopedics;  Laterality: Right;  . HERNIA REPAIR    . KNEE ARTHROSCOPY WITH ANTERIOR CRUCIATE LIGAMENT (ACL) REPAIR WITH HAMSTRING GRAFT Right 02/01/2018   Procedure: RIGHT KNEE ARTHROSCOPY WITH POSSIBLE REVISION ANTERIOR CRUCIATE LIGAMENT (ACL) REPAIR WITH AUTOGRAFT HAMSTRING;  Surgeon: Hiram Gash, MD;  Location: [place];  Service: Orthopedics;  Laterality: Right;  . KNEE ARTHROSCOPY WITH LATERAL MENISECTOMY Right 02/01/2018   Procedure: RIGHT KNEE ARTHROSCOPY WITH LATERAL MENISECTOMY;  Surgeon: Hiram Gash, MD;  Location: [place];  Service: Orthopedics;  Laterality: Right;  . KNEE ARTHROSCOPY WITH MEDIAL MENISECTOMY Right 02/01/2018   Procedure: RIGHT KNEE ARTHROSCOPY WITH MEDIAL  MENISECTOMY, CHONDROPLASTY;  Surgeon: Hiram Gash, MD;  Location: [place];  Service: Orthopedics;  Laterality: Right;  . MENISCUS REPAIR     left knee    There were no vitals filed for this visit.  Subjective Assessment - 03/16/18 1634    Subjective  "I did the prone hang with a 5-10# bag for about 7-10 min and it made me sore, I can tell I am pretty tight today"    Currently in Pain?  Yes    Pain Score  2     Pain Orientation  Right    Pain Descriptors / Indicators  Sore    Pain Type  Chronic pain    Pain Onset  More than a month ago    Pain Frequency  Intermittent    Aggravating Factors   unsure     Pain Relieving Factors  exercise         [facility] PT Assessment - 03/16/18 1640      AROM   Right Knee Extension  -10    Right Knee Flexion  107                   [facility] Adult PT Treatment/Exercise - 03/16/18 0001      Knee/Hip Exercises: Stretches   Active Hamstring Stretch  3 reps;30 seconds   PNF contract/ relax with 10 sec contraction    Quad Stretch  3 reps;30 seconds   PNF contract/ relax with 10 sec contraction  Knee/Hip Exercises: Aerobic   Recumbent Bike  L4 x 6 min    lowering seat every 2 min      Vasopneumatic   Number Minutes Vasopneumatic   15 minutes    Vasopnuematic Location   Knee    Vasopneumatic Pressure  High    Vasopneumatic Temperature   34      Manual Therapy   Manual therapy comments  MTPR along semi-tendinosus / memrbanosus    Joint Mobilization  patellar mobs grade 4 in all planes, Tibiofemoral AP/ PA grade 3 in supine    Soft tissue mobilization  IASTM along distal hamstrings, quad strength               PT Short Term Goals - 03/14/18 1724      PT SHORT TERM GOAL #1   Title  pt to be I with inital HEp     Time  4    Period  Weeks    Status  Achieved      PT SHORT TERM GOAL #2   Title  pt to verbalize and demo techniques to reduce pain and inflammation via RICE and HEP    Time  4    Period   Weeks    Status  Achieved      PT SHORT TERM GOAL #3   Title  pt to demo good quad activation and SLR with </= 10 degree quad lag for functional progression    Time  4    Period  Weeks    Status  On-going      PT SHORT TERM GOAL #4   Title  pt to increase knee flexion to >/= 90 degrees with </= 4/10 pain for therapuetic progression     Time  4    Period  Weeks    Status  Achieved        PT Long Term Goals - 02/14/18 1757      PT LONG TERM GOAL #1   Title  increase ROM to </= 2 degrees and >/= 120 degrees with </= 1/10 pain for functional ROM required for efficent gait and ADLs    Time  8    Period  Weeks    Status  New    Target Date  04/11/18      PT LONG TERM GOAL #2   Title  pt to demo RLE strength to >/= 4+/5 in all planes to promote knee stability with standing/ walking and dynamic activities    Time  8    Period  Weeks    Status  New    Target Date  04/11/18      PT LONG TERM GOAL #3   Title  pt to be able to perfor dynamic activities and jogging/ hopping with no report of pain or instability for personal goal of returning to playing basketball     Time  8    Period  Weeks    Status  New    Target Date  04/11/18      PT LONG TERM GOAL #4   Title  increase FOTO score to </= 34% limited to demo improvement in function    Time  8    Period  Weeks    Status  New    Target Date  04/11/18      PT LONG TERM GOAL #5   Title  pt to be I with all HEP given as of last visit to maintain and progress current  level of function independently.     Time  8    Period  Weeks    Status  New    Target Date  04/11/18            Plan - 03/16/18 1730    Clinical Impression Statement  pt continues to demonstrate stiffness and is progress with knee ROM but is exhibits some limited progression over the last 2 visits likely due to increased edema. focused on stretching and manual techniques to which assisted with increasing knee flexionto 110 today following manual  techniqeus. Conitnued vaso end of session for pain and edema.     PT Next Visit Plan  6 weeks post op, update HEP PRN, bike knee ROM, quad activation, hip strength, ankle strength, vaso for swelling,     PT Home Exercise Plan  quad set, standing weight shifting for involuntary quad activation, hip abduction, heel raise (in brace), heel slides with strap, SAQ    Consulted and Agree with Plan of Care  Patient       Patient will benefit from skilled therapeutic intervention in order to improve the following deficits and impairments:  Abnormal gait, Pain, Decreased strength, Increased fascial restricitons, Increased edema, Decreased balance, Decreased endurance, Improper body mechanics, Postural dysfunction, Decreased range of motion  Visit Diagnosis: Acute pain of right knee  Stiffness of right knee, not elsewhere classified  Muscle weakness (generalized)  Localized edema  Other abnormalities of gait and mobility     Problem List Patient Active Problem List   Diagnosis Date Noted  . Localized osteoarthritis of left knee 03/02/2018  . Annual physical exam 12/23/2016  . Acute meniscal tear of right knee 02/27/2015  . Right foot pain 01/19/2013   Starr Lake PT, DPT, LAT, ATC  03/16/18  5:34 PM      [facility] [address] [place], [place], [address] Phone: [phone]   Fax:  [phone]  Name: KIJANA CROMIE MRN: [id number] Date of Birth: 1980/07/09

## 2018-03-21 ENCOUNTER — Ambulatory Visit: Payer: 59 | Admitting: Physical Therapy

## 2018-03-21 ENCOUNTER — Encounter: Payer: Self-pay | Admitting: Physical Therapy

## 2018-03-21 DIAGNOSIS — M25661 Stiffness of right knee, not elsewhere classified: Secondary | ICD-10-CM | POA: Diagnosis not present

## 2018-03-21 DIAGNOSIS — M6281 Muscle weakness (generalized): Secondary | ICD-10-CM

## 2018-03-21 DIAGNOSIS — R2689 Other abnormalities of gait and mobility: Secondary | ICD-10-CM

## 2018-03-21 DIAGNOSIS — M25561 Pain in right knee: Secondary | ICD-10-CM | POA: Diagnosis not present

## 2018-03-21 DIAGNOSIS — R600 Localized edema: Secondary | ICD-10-CM | POA: Diagnosis not present

## 2018-03-21 NOTE — Therapy (Signed)
[place], [place], [address] Phone: [phone]   Fax:  [phone]  Physical Therapy Treatment  Patient Details  Name: Joshua Gaines MRN: [id number] Date of Birth: 07-09-1980 Referring Provider (PT): Ophelia Charter MD   Encounter Date: 03/21/2018  PT End of Session - 03/21/18 1636    Visit Number  10    Number of Visits  17    Date for PT Re-Evaluation  04/11/18    Authorization Type  [facility] [employer]    PT Start Time  1630    PT Stop Time  1712    PT Time Calculation (min)  42 min    Activity Tolerance  Patient tolerated treatment well    Behavior During Therapy  [facility] for tasks assessed/performed       Past Medical History:  Diagnosis Date  . GERD (gastroesophageal reflux disease)     Past Surgical History:  Procedure Laterality Date  . ANTERIOR CRUCIATE LIGAMENT REPAIR  [id number]  . CHONDROPLASTY Right 02/01/2018   Procedure: CHONDROPLASTY;  Surgeon: Hiram Gash, MD;  Location: [place];  Service: Orthopedics;  Laterality: Right;  . HARDWARE REMOVAL Right 02/01/2018   Procedure: HARDWARE REMOVAL;  Surgeon: Hiram Gash, MD;  Location: [place];  Service: Orthopedics;  Laterality: Right;  . HERNIA REPAIR    . KNEE ARTHROSCOPY WITH ANTERIOR CRUCIATE LIGAMENT (ACL) REPAIR WITH HAMSTRING GRAFT Right 02/01/2018   Procedure: RIGHT KNEE ARTHROSCOPY WITH POSSIBLE REVISION ANTERIOR CRUCIATE LIGAMENT (ACL) REPAIR WITH AUTOGRAFT HAMSTRING;  Surgeon: Hiram Gash, MD;  Location: [place];  Service: Orthopedics;  Laterality: Right;  . KNEE ARTHROSCOPY WITH LATERAL MENISECTOMY Right 02/01/2018   Procedure: RIGHT KNEE ARTHROSCOPY WITH LATERAL MENISECTOMY;  Surgeon: Hiram Gash, MD;  Location: [place];  Service: Orthopedics;  Laterality: Right;  . KNEE ARTHROSCOPY WITH MEDIAL MENISECTOMY Right 02/01/2018   Procedure: RIGHT KNEE ARTHROSCOPY WITH MEDIAL  MENISECTOMY, CHONDROPLASTY;  Surgeon: Hiram Gash, MD;  Location: [place];  Service: Orthopedics;  Laterality: Right;  . MENISCUS REPAIR     left knee    There were no vitals filed for this visit.  Subjective Assessment - 03/21/18 1636    Subjective  "         [facility] PT Assessment - 03/21/18 0001      Assessment   Medical Diagnosis  S/P R ACL Reconstruction (hamstring graft)    Referring Provider (PT)  Ophelia Charter MD      AROM   Right Knee Extension  -6    Right Knee Flexion  107   111 degrees in sitting                  [facility] Adult PT Treatment/Exercise - 03/21/18 1637      Knee/Hip Exercises: Stretches   Active Hamstring Stretch  2 reps;30 seconds    Quad Stretch  2 reps;30 seconds      Knee/Hip Exercises: Aerobic   Elliptical  L2 x 5 min elevation L2    Recumbent Bike  L4 x 6 min       Knee/Hip Exercises: Seated   Long Arc Quad  2 sets;10 reps;Strengthening;Right   focus on eccentrics   Sit to [employer]  2 sets;15 reps;without UE support      Manual Therapy   Manual Therapy  Passive ROM    Manual therapy comments  MTPR along semi-tendinosus / memrbanosus  Joint Mobilization  PA grade 3 mobs    Passive ROM  forced flexion working into end range with PT's forearm in popliteal space    Other Manual Therapy  scar tissue massage cross friction               PT Short Term Goals - 03/14/18 1724      PT SHORT TERM GOAL #1   Title  pt to be I with inital HEp     Time  4    Period  Weeks    Status  Achieved      PT SHORT TERM GOAL #2   Title  pt to verbalize and demo techniques to reduce pain and inflammation via RICE and HEP    Time  4    Period  Weeks    Status  Achieved      PT SHORT TERM GOAL #3   Title  pt to demo good quad activation and SLR with </= 10 degree quad lag for functional progression    Time  4    Period  Weeks    Status  On-going      PT SHORT TERM GOAL #4   Title  pt to increase knee flexion to >/=  90 degrees with </= 4/10 pain for therapuetic progression     Time  4    Period  Weeks    Status  Achieved        PT Long Term Goals - 02/14/18 1757      PT LONG TERM GOAL #1   Title  increase ROM to </= 2 degrees and >/= 120 degrees with </= 1/10 pain for functional ROM required for efficent gait and ADLs    Time  8    Period  Weeks    Status  New    Target Date  04/11/18      PT LONG TERM GOAL #2   Title  pt to demo RLE strength to >/= 4+/5 in all planes to promote knee stability with standing/ walking and dynamic activities    Time  8    Period  Weeks    Status  New    Target Date  04/11/18      PT LONG TERM GOAL #3   Title  pt to be able to perfor dynamic activities and jogging/ hopping with no report of pain or instability for personal goal of returning to playing basketball     Time  8    Period  Weeks    Status  New    Target Date  04/11/18      PT LONG TERM GOAL #4   Title  increase FOTO score to </= 34% limited to demo improvement in function    Time  8    Period  Weeks    Status  New    Target Date  04/11/18      PT LONG TERM GOAL #5   Title  pt to be I with all HEP given as of last visit to maintain and progress current level of function independently.     Time  8    Period  Weeks    Status  New    Target Date  04/11/18            Plan - 03/21/18 1719    Clinical Impression Statement  pt reports 1-2/10 pain today. continued working on mobs to promote knee ROM and began eccentrics due to patellar tendon  soreness which calmed with exercise. progressed strengthening to 90 degrees with sit to stand. end of session he reported no increase in pain and declined modalities.     PT Treatment/Interventions  ADLs/Self Care Home Management;Cryotherapy;Iontophoresis 4mg /ml Dexamethasone;Electrical Stimulation;Moist Heat;Ultrasound;Balance training;Neuromuscular re-education;Therapeutic activities;Therapeutic exercise;Manual techniques;Vasopneumatic Device;Gait  training;Patient/family education;Taping    PT Next Visit Plan  6 weeks post op, update HEP PRN, bike knee ROM, quad activation, hip strength, ankle strength, vaso for swelling,     PT Home Exercise Plan  quad set, standing weight shifting for involuntary quad activation, hip abduction, heel raise (in brace), heel slides with strap, SAQ    Consulted and Agree with Plan of Care  Patient       Patient will benefit from skilled therapeutic intervention in order to improve the following deficits and impairments:  Abnormal gait, Pain, Decreased strength, Increased fascial restricitons, Increased edema, Decreased balance, Decreased endurance, Improper body mechanics, Postural dysfunction, Decreased range of motion  Visit Diagnosis: Acute pain of right knee  Stiffness of right knee, not elsewhere classified  Muscle weakness (generalized)  Localized edema  Other abnormalities of gait and mobility     Problem List Patient Active Problem List   Diagnosis Date Noted  . Localized osteoarthritis of left knee 03/02/2018  . Annual physical exam 12/23/2016  . Acute meniscal tear of right knee 02/27/2015  . Right foot pain 01/19/2013   Starr Lake PT, DPT, LAT, ATC  03/21/18  5:23 PM      [facility] [address] [place], [place], [address] Phone: [phone]   Fax:  [phone]  Name: Joshua Gaines MRN: [id number] Date of Birth: Aug 18, 1980

## 2018-03-23 ENCOUNTER — Ambulatory Visit: Payer: 59 | Admitting: Physical Therapy

## 2018-03-28 ENCOUNTER — Encounter: Payer: Self-pay | Admitting: Physical Therapy

## 2018-03-28 ENCOUNTER — Ambulatory Visit: Payer: 59 | Admitting: Physical Therapy

## 2018-03-28 DIAGNOSIS — M6281 Muscle weakness (generalized): Secondary | ICD-10-CM

## 2018-03-28 DIAGNOSIS — M25561 Pain in right knee: Secondary | ICD-10-CM | POA: Diagnosis not present

## 2018-03-28 DIAGNOSIS — R2689 Other abnormalities of gait and mobility: Secondary | ICD-10-CM | POA: Diagnosis not present

## 2018-03-28 DIAGNOSIS — R600 Localized edema: Secondary | ICD-10-CM

## 2018-03-28 DIAGNOSIS — M25661 Stiffness of right knee, not elsewhere classified: Secondary | ICD-10-CM

## 2018-03-28 NOTE — Therapy (Signed)
[place], [place], [address] Phone: [phone]   Fax:  [phone]  Physical Therapy Treatment  Patient Details  Name: Joshua Gaines MRN: [id number] Date of Birth: 1980-10-28 Referring Provider (PT): Ophelia Charter MD   Encounter Date: 03/28/2018  PT End of Session - 03/28/18 0847    Visit Number  11    Number of Visits  17    Date for PT Re-Evaluation  04/11/18    Authorization Type  [facility]    PT Start Time  0801    PT Stop Time  0846    PT Time Calculation (min)  45 min    Activity Tolerance  Patient tolerated treatment well    Behavior During Therapy  [facility] for tasks assessed/performed       Past Medical History:  Diagnosis Date  . GERD (gastroesophageal reflux disease)     Past Surgical History:  Procedure Laterality Date  . ANTERIOR CRUCIATE LIGAMENT REPAIR  [id number]  . CHONDROPLASTY Right 02/01/2018   Procedure: CHONDROPLASTY;  Surgeon: Hiram Gash, MD;  Location: [place];  Service: Orthopedics;  Laterality: Right;  . HARDWARE REMOVAL Right 02/01/2018   Procedure: HARDWARE REMOVAL;  Surgeon: Hiram Gash, MD;  Location: [place];  Service: Orthopedics;  Laterality: Right;  . HERNIA REPAIR    . KNEE ARTHROSCOPY WITH ANTERIOR CRUCIATE LIGAMENT (ACL) REPAIR WITH HAMSTRING GRAFT Right 02/01/2018   Procedure: RIGHT KNEE ARTHROSCOPY WITH POSSIBLE REVISION ANTERIOR CRUCIATE LIGAMENT (ACL) REPAIR WITH AUTOGRAFT HAMSTRING;  Surgeon: Hiram Gash, MD;  Location: [place];  Service: Orthopedics;  Laterality: Right;  . KNEE ARTHROSCOPY WITH LATERAL MENISECTOMY Right 02/01/2018   Procedure: RIGHT KNEE ARTHROSCOPY WITH LATERAL MENISECTOMY;  Surgeon: Hiram Gash, MD;  Location: [place];  Service: Orthopedics;  Laterality: Right;  . KNEE ARTHROSCOPY WITH MEDIAL MENISECTOMY Right 02/01/2018   Procedure: RIGHT KNEE ARTHROSCOPY WITH MEDIAL  MENISECTOMY, CHONDROPLASTY;  Surgeon: Hiram Gash, MD;  Location: [place];  Service: Orthopedics;  Laterality: Right;  . MENISCUS REPAIR     left knee    There were no vitals filed for this visit.  Subjective Assessment - 03/28/18 0807    Subjective  "I am doing pretty good today, no pain"    Currently in Pain?  No/denies    Aggravating Factors   N/A         [facility] PT Assessment - 03/28/18 0001      AROM   Right Knee Extension  -2    Right Knee Flexion  115                   [facility] Adult PT Treatment/Exercise - 03/28/18 0001      Exercises   Exercises  Knee/Hip      Knee/Hip Exercises: Stretches   Active Hamstring Stretch  2 reps;30 seconds    Quad Stretch  2 reps;30 seconds    Gastroc Stretch  2 reps;30 seconds   slantboard     Knee/Hip Exercises: Aerobic   Elliptical  L2 x 6 min , elevation L1       Knee/Hip Exercises: Standing   Step Down  2 sets;10 reps   with ball squeeze for VMO activation   Other Standing Knee Exercises  TRX 2 x 15 body weight squats avoiding going beyond 90 degree bend      Knee/Hip Exercises: Supine   Quad Sets  2 sets;15 reps   with ball squeeze for vmo activation   Bridges  2 sets;15 reps   with heels on black foam roll         Balance Exercises - 03/28/18 0838      Balance Exercises: Standing   Rebounder  15 reps;Single leg;Static;Foam/compliant surface   4 sets, 1 x with red ball, 3 x with yellow ball       PT Education - 03/28/18 0849    Education Details  quad sets with ball squeeze and step down strengthening avoiding going beyond 90 degrees.     Person(s) Educated  Patient    Methods  Explanation;Demonstration;Verbal cues    Comprehension  Verbalized understanding;Returned demonstration;Verbal cues required       PT Short Term Goals - 03/14/18 1724      PT SHORT TERM GOAL #1   Title  pt to be I with inital HEp     Time  4    Period  Weeks    Status  Achieved      PT SHORT TERM  GOAL #2   Title  pt to verbalize and demo techniques to reduce pain and inflammation via RICE and HEP    Time  4    Period  Weeks    Status  Achieved      PT SHORT TERM GOAL #3   Title  pt to demo good quad activation and SLR with </= 10 degree quad lag for functional progression    Time  4    Period  Weeks    Status  On-going      PT SHORT TERM GOAL #4   Title  pt to increase knee flexion to >/= 90 degrees with </= 4/10 pain for therapuetic progression     Time  4    Period  Weeks    Status  Achieved        PT Long Term Goals - 02/14/18 1757      PT LONG TERM GOAL #1   Title  increase ROM to </= 2 degrees and >/= 120 degrees with </= 1/10 pain for functional ROM required for efficent gait and ADLs    Time  8    Period  Weeks    Status  New    Target Date  04/11/18      PT LONG TERM GOAL #2   Title  pt to demo RLE strength to >/= 4+/5 in all planes to promote knee stability with standing/ walking and dynamic activities    Time  8    Period  Weeks    Status  New    Target Date  04/11/18      PT LONG TERM GOAL #3   Title  pt to be able to perfor dynamic activities and jogging/ hopping with no report of pain or instability for personal goal of returning to playing basketball     Time  8    Period  Weeks    Status  New    Target Date  04/11/18      PT LONG TERM GOAL #4   Title  increase FOTO score to </= 34% limited to demo improvement in function    Time  8    Period  Weeks    Status  New    Target Date  04/11/18      PT LONG TERM GOAL #5   Title  pt to be I with all HEP given as  of last visit to maintain and progress current level of function independently.     Time  8    Period  Weeks    Status  New    Target Date  04/11/18            Plan - 03/28/18 0847    Clinical Impression Statement  Joshua Gaines has improved his ROM today from 2 to 115 today and reports no pain. continued progressiong of strengthening per protocol staying with [facility] activities today. he  did demonstrate popping along the patella with step down exercsie which decreased with VMO activation. no pain noted end or session.     PT Next Visit Plan  8 on 03/29/2018 weeks post op, update HEP PRN, bike knee ROM, quad activation, hip strength, ankle strength, vaso for swelling,     PT Home Exercise Plan  quad set, standing weight shifting for involuntary quad activation, hip abduction, heel raise (in brace), heel slides with strap, SAQ    Consulted and Agree with Plan of Care  Patient       Patient will benefit from skilled therapeutic intervention in order to improve the following deficits and impairments:  Abnormal gait, Pain, Decreased strength, Increased fascial restricitons, Increased edema, Decreased balance, Decreased endurance, Improper body mechanics, Postural dysfunction, Decreased range of motion  Visit Diagnosis: Acute pain of right knee  Stiffness of right knee, not elsewhere classified  Muscle weakness (generalized)  Localized edema  Other abnormalities of gait and mobility     Problem List Patient Active Problem List   Diagnosis Date Noted  . Localized osteoarthritis of left knee 03/02/2018  . Annual physical exam 12/23/2016  . Acute meniscal tear of right knee 02/27/2015  . Right foot pain 01/19/2013   Starr Lake PT, DPT, LAT, ATC  03/28/18  8:50 AM      [facility] [address] [place], [place], [address] Phone: [phone]   Fax:  [phone]  Name: Joshua Gaines MRN: [id number] Date of Birth: 1980-06-12

## 2018-04-04 ENCOUNTER — Other Ambulatory Visit: Payer: Self-pay | Admitting: Orthopaedic Surgery

## 2018-04-04 DIAGNOSIS — M2341 Loose body in knee, right knee: Secondary | ICD-10-CM | POA: Diagnosis not present

## 2018-04-04 DIAGNOSIS — M2342 Loose body in knee, left knee: Secondary | ICD-10-CM

## 2018-04-05 ENCOUNTER — Encounter: Payer: Self-pay | Admitting: Physical Therapy

## 2018-04-05 ENCOUNTER — Ambulatory Visit: Payer: 59 | Attending: Orthopaedic Surgery | Admitting: Physical Therapy

## 2018-04-05 DIAGNOSIS — M25561 Pain in right knee: Secondary | ICD-10-CM | POA: Insufficient documentation

## 2018-04-05 DIAGNOSIS — R600 Localized edema: Secondary | ICD-10-CM | POA: Insufficient documentation

## 2018-04-05 DIAGNOSIS — M6281 Muscle weakness (generalized): Secondary | ICD-10-CM | POA: Insufficient documentation

## 2018-04-05 DIAGNOSIS — M25661 Stiffness of right knee, not elsewhere classified: Secondary | ICD-10-CM | POA: Diagnosis not present

## 2018-04-05 DIAGNOSIS — R2689 Other abnormalities of gait and mobility: Secondary | ICD-10-CM | POA: Diagnosis not present

## 2018-04-05 NOTE — Therapy (Signed)
[place], [place], [address] Phone: [phone]   Fax:  [phone]  Physical Therapy Treatment  Patient Details  Name: Joshua Gaines MRN: [id number] Date of Birth: 11-07-1980 Referring Provider (PT): Ophelia Charter MD   Encounter Date: 04/05/2018  PT End of Session - 04/05/18 1649    Visit Number  12    Number of Visits  17    Date for PT Re-Evaluation  04/11/18    Authorization Type  [facility]    PT Start Time  1500    PT Stop Time  1545    PT Time Calculation (min)  45 min    Activity Tolerance  Patient tolerated treatment well    Behavior During Therapy  [facility] for tasks assessed/performed       Past Medical History:  Diagnosis Date  . GERD (gastroesophageal reflux disease)     Past Surgical History:  Procedure Laterality Date  . ANTERIOR CRUCIATE LIGAMENT REPAIR  [id number]  . CHONDROPLASTY Right 02/01/2018   Procedure: CHONDROPLASTY;  Surgeon: Hiram Gash, MD;  Location: [place];  Service: Orthopedics;  Laterality: Right;  . HARDWARE REMOVAL Right 02/01/2018   Procedure: HARDWARE REMOVAL;  Surgeon: Hiram Gash, MD;  Location: [place];  Service: Orthopedics;  Laterality: Right;  . HERNIA REPAIR    . KNEE ARTHROSCOPY WITH ANTERIOR CRUCIATE LIGAMENT (ACL) REPAIR WITH HAMSTRING GRAFT Right 02/01/2018   Procedure: RIGHT KNEE ARTHROSCOPY WITH POSSIBLE REVISION ANTERIOR CRUCIATE LIGAMENT (ACL) REPAIR WITH AUTOGRAFT HAMSTRING;  Surgeon: Hiram Gash, MD;  Location: [place];  Service: Orthopedics;  Laterality: Right;  . KNEE ARTHROSCOPY WITH LATERAL MENISECTOMY Right 02/01/2018   Procedure: RIGHT KNEE ARTHROSCOPY WITH LATERAL MENISECTOMY;  Surgeon: Hiram Gash, MD;  Location: [place];  Service: Orthopedics;  Laterality: Right;  . KNEE ARTHROSCOPY WITH MEDIAL MENISECTOMY Right 02/01/2018   Procedure: RIGHT KNEE ARTHROSCOPY WITH MEDIAL  MENISECTOMY, CHONDROPLASTY;  Surgeon: Hiram Gash, MD;  Location: [place];  Service: Orthopedics;  Laterality: Right;  . MENISCUS REPAIR     left knee    There were no vitals filed for this visit.  Subjective Assessment - 04/05/18 1502    Subjective  "i am doing good"     Patient Stated Goals  return back to normal as much as possible, playing basketball,     Currently in Pain?  No/denies    Pain Onset  More than a month ago    Pain Frequency  Intermittent         [facility] PT Assessment - 04/05/18 0001      AROM   Right Knee Extension  -3    Right Knee Flexion  120                   [facility] Adult PT Treatment/Exercise - 04/05/18 0001      Exercises   Exercises  Knee/Hip      Knee/Hip Exercises: Stretches   Active Hamstring Stretch  2 reps;30 seconds    Quad Stretch  2 reps;30 seconds    Gastroc Stretch  2 reps;30 seconds   slant board     Knee/Hip Exercises: Aerobic   Elliptical  L5 x 6 min , elevation L5      Knee/Hip Exercises: Plyometrics   Other Plyometric Exercises  alternating toe taps on step 3 x 20 sec eal      Knee/Hip Exercises: Standing  Forward Lunges  2 sets;20 reps   tactile cues onto BOSU   Forward Step Up  2 sets;20 reps   on to BOSU   Other Standing Knee Exercises  wall squat with wall sit 1 x 5 holding sit for 20 sec    Other Standing Knee Exercises  lateral band walks 2 x 15 with blue theraband      Knee/Hip Exercises: Seated   Other Seated Knee/Hip Exercises  russian eccentric hamstring curls 2 x 10    bolster under chest to shorten length of activation              PT Short Term Goals - 03/14/18 1724      PT SHORT TERM GOAL #1   Title  pt to be I with inital HEp     Time  4    Period  Weeks    Status  Achieved      PT SHORT TERM GOAL #2   Title  pt to verbalize and demo techniques to reduce pain and inflammation via RICE and HEP    Time  4    Period  Weeks    Status  Achieved      PT SHORT  TERM GOAL #3   Title  pt to demo good quad activation and SLR with </= 10 degree quad lag for functional progression    Time  4    Period  Weeks    Status  On-going      PT SHORT TERM GOAL #4   Title  pt to increase knee flexion to >/= 90 degrees with </= 4/10 pain for therapuetic progression     Time  4    Period  Weeks    Status  Achieved        PT Long Term Goals - 02/14/18 1757      PT LONG TERM GOAL #1   Title  increase ROM to </= 2 degrees and >/= 120 degrees with </= 1/10 pain for functional ROM required for efficent gait and ADLs    Time  8    Period  Weeks    Status  New    Target Date  04/11/18      PT LONG TERM GOAL #2   Title  pt to demo RLE strength to >/= 4+/5 in all planes to promote knee stability with standing/ walking and dynamic activities    Time  8    Period  Weeks    Status  New    Target Date  04/11/18      PT LONG TERM GOAL #3   Title  pt to be able to perfor dynamic activities and jogging/ hopping with no report of pain or instability for personal goal of returning to playing basketball     Time  8    Period  Weeks    Status  New    Target Date  04/11/18      PT LONG TERM GOAL #4   Title  increase FOTO score to </= 34% limited to demo improvement in function    Time  8    Period  Weeks    Status  New    Target Date  04/11/18      PT LONG TERM GOAL #5   Title  pt to be I with all HEP given as of last visit to maintain and progress current level of function independently.     Time  8  Period  Weeks    Status  New    Target Date  04/11/18            Plan - 04/05/18 1649    Clinical Impression Statement  Joshua Gaines continues to make progress increasing knee ROM 3 to 120 degrees today. continued working on Retail [employer] and light plyometric activity in forward motions only. no report of pain noted end of session. and he declined modalties    PT Next Visit Plan  9 on 03/29/2018 weeks post op, update HEP PRN, bike knee ROM,  quad activation, hip strength, ankle strength, vaso for swelling PRN, light plyo    PT Home Exercise Plan  quad set, standing weight shifting for involuntary quad activation, hip abduction, heel raise (in brace), heel slides with strap, SAQ    Consulted and Agree with Plan of Care  Patient       Patient will benefit from skilled therapeutic intervention in order to improve the following deficits and impairments:     Visit Diagnosis: Acute pain of right knee  Stiffness of right knee, not elsewhere classified  Localized edema  Muscle weakness (generalized)  Other abnormalities of gait and mobility     Problem List Patient Active Problem List   Diagnosis Date Noted  . Localized osteoarthritis of left knee 03/02/2018  . Annual physical exam 12/23/2016  . Acute meniscal tear of right knee 02/27/2015  . Right foot pain 01/19/2013   Starr Lake PT, DPT, LAT, ATC  04/05/18  4:52 PM      [facility] [address] [place], [place], [address] Phone: [phone]   Fax:  [phone]  Name: Joshua Gaines MRN: [id number] Date of Birth: 1980-09-14

## 2018-04-06 ENCOUNTER — Encounter: Payer: Self-pay | Admitting: Physical Therapy

## 2018-04-06 ENCOUNTER — Ambulatory Visit: Payer: 59 | Admitting: Physical Therapy

## 2018-04-06 DIAGNOSIS — M25661 Stiffness of right knee, not elsewhere classified: Secondary | ICD-10-CM

## 2018-04-06 DIAGNOSIS — M6281 Muscle weakness (generalized): Secondary | ICD-10-CM | POA: Diagnosis not present

## 2018-04-06 DIAGNOSIS — R600 Localized edema: Secondary | ICD-10-CM | POA: Diagnosis not present

## 2018-04-06 DIAGNOSIS — M25561 Pain in right knee: Secondary | ICD-10-CM | POA: Diagnosis not present

## 2018-04-06 DIAGNOSIS — R2689 Other abnormalities of gait and mobility: Secondary | ICD-10-CM

## 2018-04-06 NOTE — Therapy (Signed)
[place], [place], [address] Phone: [phone]   Fax:  [phone]  Physical Therapy Treatment  Patient Details  Name: Joshua Gaines MRN: [id number] Date of Birth: 1981/02/22 Referring Provider (PT): Ophelia Charter MD   Encounter Date: 04/06/2018  PT End of Session - 04/06/18 1437    Visit Number  13    Number of Visits  17    Date for PT Re-Evaluation  04/11/18    Authorization Type  [facility] [employer]    PT Start Time  1430   pt arrived 15 min late   PT Stop Time  1500    PT Time Calculation (min)  30 min       Past Medical History:  Diagnosis Date  . GERD (gastroesophageal reflux disease)     Past Surgical History:  Procedure Laterality Date  . ANTERIOR CRUCIATE LIGAMENT REPAIR  [id number]  . CHONDROPLASTY Right 02/01/2018   Procedure: CHONDROPLASTY;  Surgeon: Hiram Gash, MD;  Location: [facility];  Service: Orthopedics;  Laterality: Right;  . HARDWARE REMOVAL Right 02/01/2018   Procedure: HARDWARE REMOVAL;  Surgeon: Hiram Gash, MD;  Location: [facility];  Service: Orthopedics;  Laterality: Right;  . HERNIA REPAIR    . KNEE ARTHROSCOPY WITH ANTERIOR CRUCIATE LIGAMENT (ACL) REPAIR WITH HAMSTRING GRAFT Right 02/01/2018   Procedure: RIGHT KNEE ARTHROSCOPY WITH POSSIBLE REVISION ANTERIOR CRUCIATE LIGAMENT (ACL) REPAIR WITH AUTOGRAFT HAMSTRING;  Surgeon: Hiram Gash, MD;  Location: [facility];  Service: Orthopedics;  Laterality: Right;  . KNEE ARTHROSCOPY WITH LATERAL MENISECTOMY Right 02/01/2018   Procedure: RIGHT KNEE ARTHROSCOPY WITH LATERAL MENISECTOMY;  Surgeon: Hiram Gash, MD;  Location: [facility];  Service: Orthopedics;  Laterality: Right;  . KNEE ARTHROSCOPY WITH MEDIAL MENISECTOMY Right 02/01/2018   Procedure: RIGHT KNEE ARTHROSCOPY WITH MEDIAL MENISECTOMY, CHONDROPLASTY;  Surgeon: Hiram Gash, MD;  Location: [facility];   Service: Orthopedics;  Laterality: Right;  . MENISCUS REPAIR     left knee    There were no vitals filed for this visit.                    [facility] Adult PT Treatment/Exercise - 04/06/18 0001      Exercises   Exercises  Knee/Hip      Knee/Hip Exercises: Stretches   Active Hamstring Stretch  2 reps;30 seconds    Quad Stretch  2 reps;30 seconds   in prone     Knee/Hip Exercises: Aerobic   Stepper  L3 x 5 min      Knee/Hip Exercises: Standing   Side Lunges  2 sets;15 reps   R foot ofn 8 inch step   Step Down  2 sets;15 reps;Step Height: 8"    Other Standing Knee Exercises  bulgarian split squat with SLS on the R LE 1 x 10      Knee/Hip Exercises: Supine   Straight Leg Raises  2 sets;15 reps   3#      Knee/Hip Exercises: Sidelying   Hip ABduction  2 sets   going to fatigue 3# moving forward              PT Short Term Goals - 03/14/18 1724      PT SHORT TERM GOAL #1   Title  pt to be I with inital HEp     Time  4    Period  Weeks  Status  Achieved      PT SHORT TERM GOAL #2   Title  pt to verbalize and demo techniques to reduce pain and inflammation via RICE and HEP    Time  4    Period  Weeks    Status  Achieved      PT SHORT TERM GOAL #3   Title  pt to demo good quad activation and SLR with </= 10 degree quad lag for functional progression    Time  4    Period  Weeks    Status  On-going      PT SHORT TERM GOAL #4   Title  pt to increase knee flexion to >/= 90 degrees with </= 4/10 pain for therapuetic progression     Time  4    Period  Weeks    Status  Achieved        PT Long Term Goals - 02/14/18 1757      PT LONG TERM GOAL #1   Title  increase ROM to </= 2 degrees and >/= 120 degrees with </= 1/10 pain for functional ROM required for efficent gait and ADLs    Time  8    Period  Weeks    Status  New    Target Date  04/11/18      PT LONG TERM GOAL #2   Title  pt to demo RLE strength to >/= 4+/5 in all planes to promote  knee stability with standing/ walking and dynamic activities    Time  8    Period  Weeks    Status  New    Target Date  04/11/18      PT LONG TERM GOAL #3   Title  pt to be able to perfor dynamic activities and jogging/ hopping with no report of pain or instability for personal goal of returning to playing basketball     Time  8    Period  Weeks    Status  New    Target Date  04/11/18      PT LONG TERM GOAL #4   Title  increase FOTO score to </= 34% limited to demo improvement in function    Time  8    Period  Weeks    Status  New    Target Date  04/11/18      PT LONG TERM GOAL #5   Title  pt to be I with all HEP given as of last visit to maintain and progress current level of function independently.     Time  8    Period  Weeks    Status  New    Target Date  04/11/18            Plan - 04/06/18 1520    Clinical Impression Statement  pt arrived 15 min late due thinking his appointment. no pain reported today, continued working on knee strengthening for quad and hamstring keeping quad with in [facility] activities. no report of pain or discomfort end of session and pt declined modalities.     PT Treatment/Interventions  ADLs/Self Care Home Management;Cryotherapy;Iontophoresis 4mg /ml Dexamethasone;Electrical Stimulation;Moist Heat;Ultrasound;Balance training;Neuromuscular re-education;Therapeutic activities;Therapeutic exercise;Manual techniques;Vasopneumatic Device;Gait training;Patient/family education;Taping       Patient will benefit from skilled therapeutic intervention in order to improve the following deficits and impairments:  Abnormal gait, Pain, Decreased strength, Increased fascial restricitons, Increased edema, Decreased balance, Decreased endurance, Improper body mechanics, Postural dysfunction, Decreased range of motion  Visit Diagnosis: Acute pain  of right knee  Stiffness of right knee, not elsewhere classified  Localized edema  Muscle weakness  (generalized)  Other abnormalities of gait and mobility     Problem List Patient Active Problem List   Diagnosis Date Noted  . Localized osteoarthritis of left knee 03/02/2018  . Annual physical exam 12/23/2016  . Acute meniscal tear of right knee 02/27/2015  . Right foot pain 01/19/2013   Starr Lake PT, DPT, LAT, ATC  04/06/18  3:26 PM      [facility] [address] [place], [place], [address] Phone: [phone]   Fax:  [phone]  Name: Joshua Gaines MRN: [id number] Date of Birth: 05/03/1981

## 2018-04-10 ENCOUNTER — Ambulatory Visit: Payer: 59 | Admitting: Physical Therapy

## 2018-04-10 ENCOUNTER — Encounter: Payer: Self-pay | Admitting: Physical Therapy

## 2018-04-10 ENCOUNTER — Ambulatory Visit (INDEPENDENT_AMBULATORY_CARE_PROVIDER_SITE_OTHER): Payer: 59

## 2018-04-10 DIAGNOSIS — M25661 Stiffness of right knee, not elsewhere classified: Secondary | ICD-10-CM

## 2018-04-10 DIAGNOSIS — R600 Localized edema: Secondary | ICD-10-CM

## 2018-04-10 DIAGNOSIS — R2689 Other abnormalities of gait and mobility: Secondary | ICD-10-CM | POA: Diagnosis not present

## 2018-04-10 DIAGNOSIS — M25561 Pain in right knee: Secondary | ICD-10-CM

## 2018-04-10 DIAGNOSIS — M2342 Loose body in knee, left knee: Secondary | ICD-10-CM

## 2018-04-10 DIAGNOSIS — M6281 Muscle weakness (generalized): Secondary | ICD-10-CM | POA: Diagnosis not present

## 2018-04-10 DIAGNOSIS — M1712 Unilateral primary osteoarthritis, left knee: Secondary | ICD-10-CM | POA: Diagnosis not present

## 2018-04-10 NOTE — Therapy (Signed)
[place], [place], [address] Phone: [phone]   Fax:  [phone]  Physical Therapy Treatment/ Re-certification   Patient Details  Name: Joshua Gaines MRN: [id number] Date of Birth: [birth year] Referring Provider (PT): Ophelia Charter MD   Encounter Date: 04/10/2018  PT End of Session - 04/10/18 1513    Visit Number  14    Number of Visits  24    Date for PT Re-Evaluation  06/19/18    Authorization Type  [facility]    PT Start Time  1502    PT Stop Time  1548    PT Time Calculation (min)  46 min    Activity Tolerance  Patient tolerated treatment well    Behavior During Therapy  [facility] for tasks assessed/performed       Past Medical History:  Diagnosis Date  . GERD (gastroesophageal reflux disease)     Past Surgical History:  Procedure Laterality Date  . ANTERIOR CRUCIATE LIGAMENT REPAIR  [id number]  . CHONDROPLASTY Right 02/01/2018   Procedure: CHONDROPLASTY;  Surgeon: Hiram Gash, MD;  Location: [facility];  Service: Orthopedics;  Laterality: Right;  . HARDWARE REMOVAL Right 02/01/2018   Procedure: HARDWARE REMOVAL;  Surgeon: Hiram Gash, MD;  Location: [facility];  Service: Orthopedics;  Laterality: Right;  . HERNIA REPAIR    . KNEE ARTHROSCOPY WITH ANTERIOR CRUCIATE LIGAMENT (ACL) REPAIR WITH HAMSTRING GRAFT Right 02/01/2018   Procedure: RIGHT KNEE ARTHROSCOPY WITH POSSIBLE REVISION ANTERIOR CRUCIATE LIGAMENT (ACL) REPAIR WITH AUTOGRAFT HAMSTRING;  Surgeon: Hiram Gash, MD;  Location: [facility];  Service: Orthopedics;  Laterality: Right;  . KNEE ARTHROSCOPY WITH LATERAL MENISECTOMY Right 02/01/2018   Procedure: RIGHT KNEE ARTHROSCOPY WITH LATERAL MENISECTOMY;  Surgeon: Hiram Gash, MD;  Location: [facility];  Service: Orthopedics;  Laterality: Right;  . KNEE ARTHROSCOPY WITH MEDIAL MENISECTOMY Right 02/01/2018   Procedure: RIGHT KNEE  ARTHROSCOPY WITH MEDIAL MENISECTOMY, CHONDROPLASTY;  Surgeon: Hiram Gash, MD;  Location: [facility];  Service: Orthopedics;  Laterality: Right;  . MENISCUS REPAIR     left knee    There were no vitals filed for this visit.  Subjective Assessment - 04/10/18 1513    Subjective  "no issues"     Currently in Pain?  No/denies         [facility] PT Assessment - 04/10/18 1518      Assessment   Medical Diagnosis  S/P R ACL Reconstruction (hamstring graft)    Referring Provider (PT)  Ophelia Charter MD      Observation/Other Assessments   Focus on Therapeutic Outcomes (FOTO)   38% limited      AROM   Right Knee Extension  -2    Right Knee Flexion  123      Strength   Strength Assessment Site  Hip    Right/Left Hip  Right;Left    Right Hip Extension  4+/5    Right Hip ABduction  5/5    Left Hip Extension  4+/5    Left Hip ABduction  4+/5    Right/Left Knee  Right    Right Knee Flexion  4+/5    Right Knee Extension  4+/5                   [facility] Adult PT Treatment/Exercise - 04/10/18 0001      Exercises   Exercises  Knee/Hip  Knee/Hip Exercises: Stretches   Active Hamstring Stretch  2 reps;30 seconds    Quad Stretch  2 reps;30 seconds   prone   Gastroc Stretch  2 reps;Both;30 seconds   slant board   Soleus Stretch  2 reps;30 seconds;Both   slant board     Knee/Hip Exercises: Aerobic   Elliptical  L5 x 4 min , elevation L5    Stepper  L3 x 3 min      Knee/Hip Exercises: Standing   Other Standing Knee Exercises  star lunge 1 x 10 SLS on RLE only      Knee/Hip Exercises: Seated   Other Seated Knee/Hip Exercises  russian eccentric hamstring curls 2 x 10           Balance Exercises - 04/10/18 1539      Balance Exercises: Standing   Rebounder  Foam/compliant surface;15 reps;Single leg   x 3 sets    Other Standing Exercises  1/2 kneeling in tandem cross body cone exercise alternating leg that is down           PT Short Term Goals -  04/10/18 1530      PT SHORT TERM GOAL #1   Title  pt to be I with inital HEp     Time  4    Period  Weeks    Status  Achieved      PT SHORT TERM GOAL #2   Title  pt to verbalize and demo techniques to reduce pain and inflammation via RICE and HEP    Period  Weeks    Status  Achieved      PT SHORT TERM GOAL #3   Title  pt to demo good quad activation and SLR with </= 10 degree quad lag for functional progression    Time  4    Period  Weeks    Status  Achieved      PT SHORT TERM GOAL #4   Title  pt to increase knee flexion to >/= 90 degrees with </= 4/10 pain for therapuetic progression     Period  Weeks    Status  Achieved        PT Long Term Goals - 04/10/18 1531      PT LONG TERM GOAL #1   Title  increase ROM to </= 2 degrees and >/= 120 degrees with </= 1/10 pain for functional ROM required for efficent gait and ADLs    Baseline  pain at 3-4/10 with deep bending    Time  8    Period  Weeks    Status  On-going    Target Date  06/19/18      PT LONG TERM GOAL #2   Title  pt to demo RLE strength to >/= 4+/5 in all planes to promote knee stability with standing/ walking and dynamic activities    Time  8    Period  Weeks    Status  On-going      PT LONG TERM GOAL #3   Title  pt to be able to perfor dynamic activities and jogging/ hopping with no report of pain or instability for personal goal of returning to playing basketball     Time  8    Period  Weeks    Status  On-going      PT LONG TERM GOAL #4   Title  increase FOTO score to </= 34% limited to demo improvement in function    Time  8  Period  Weeks    Status  On-going      PT LONG TERM GOAL #5   Title  pt to be I with all HEP given as of last visit to maintain and progress current level of function independently.     Period  Weeks    Status  On-going            Plan - 04/10/18 1552    Clinical Impression Statement  [name] continues to make great progress increasing both ROM and strength. continued  progression of strengthening within the protocol. he met all STG's and is progressing appropriatley with long term goals. He would benefit from continued physical therapy to improve strength, endurance, balance and dynamic activities.     PT Frequency  2x / week    PT Duration  8 weeks   progressing to 1 x week following new year   PT Treatment/Interventions  ADLs/Self Care Home Management;Cryotherapy;Iontophoresis 70m/ml Dexamethasone;Electrical Stimulation;Moist Heat;Ultrasound;Balance training;Neuromuscular re-education;Therapeutic activities;Therapeutic exercise;Manual techniques;Vasopneumatic Device;Gait training;Patient/family education;Taping    PT Next Visit Plan  10 on  weeks post op, update HEP PRN, bike knee ROM, quad activation, hip strength, ankle strength, vaso for swelling PRN, light plyo    PT Home Exercise Plan  quad set, standing weight shifting for involuntary quad activation, hip abduction, heel raise (in brace), heel slides with strap, SAQ    Consulted and Agree with Plan of Care  Patient       Patient will benefit from skilled therapeutic intervention in order to improve the following deficits and impairments:  Abnormal gait, Pain, Decreased strength, Increased fascial restricitons, Increased edema, Decreased balance, Decreased endurance, Improper body mechanics, Postural dysfunction, Decreased range of motion  Visit Diagnosis: Acute pain of right knee - Plan: PT plan of care cert/re-cert  Stiffness of right knee, not elsewhere classified - Plan: PT plan of care cert/re-cert  Localized edema - Plan: PT plan of care cert/re-cert  Muscle weakness (generalized) - Plan: PT plan of care cert/re-cert  Other abnormalities of gait and mobility - Plan: PT plan of care cert/re-cert     Problem List Patient Active Problem List   Diagnosis Date Noted  . Localized osteoarthritis of left knee 03/02/2018  . Annual physical exam 12/23/2016  . Acute meniscal tear of right knee  02/27/2015  . Right foot pain 01/19/2013   KStarr LakePT, DPT, LAT, ATC  04/10/18  4:01 PM      [facility] [address]Phone: [phone]  Fax:  [phone] Name: JORTON CAPELLMRN: [id number] of Birth: 9October 09, 1982

## 2018-04-14 ENCOUNTER — Encounter

## 2018-04-19 ENCOUNTER — Ambulatory Visit: Payer: 59 | Admitting: Physical Therapy

## 2018-04-19 ENCOUNTER — Encounter: Payer: Self-pay | Admitting: Physical Therapy

## 2018-04-19 DIAGNOSIS — M25561 Pain in right knee: Secondary | ICD-10-CM

## 2018-04-19 DIAGNOSIS — M6281 Muscle weakness (generalized): Secondary | ICD-10-CM | POA: Diagnosis not present

## 2018-04-19 DIAGNOSIS — R2689 Other abnormalities of gait and mobility: Secondary | ICD-10-CM | POA: Diagnosis not present

## 2018-04-19 DIAGNOSIS — R600 Localized edema: Secondary | ICD-10-CM

## 2018-04-19 DIAGNOSIS — M25661 Stiffness of right knee, not elsewhere classified: Secondary | ICD-10-CM | POA: Diagnosis not present

## 2018-04-19 NOTE — Therapy (Signed)
[place], [place], [address] Phone: [phone]   Fax:  [phone]  Physical Therapy Treatment  Patient Details  Name: Joshua Gaines MRN: [id number] Date of Birth: 1981/02/01 Referring Provider (PT): Ophelia Charter MD   Encounter Date: 04/19/2018  PT End of Session - 04/19/18 1338    Visit Number  15    Number of Visits  24    Date for PT Re-Evaluation  06/19/18    Authorization Type  [facility]    PT Start Time  1332    PT Stop Time  1415    PT Time Calculation (min)  43 min    Activity Tolerance  Patient tolerated treatment well    Behavior During Therapy  [facility] for tasks assessed/performed       Past Medical History:  Diagnosis Date  . GERD (gastroesophageal reflux disease)     Past Surgical History:  Procedure Laterality Date  . ANTERIOR CRUCIATE LIGAMENT REPAIR  [id number]  . CHONDROPLASTY Right 02/01/2018   Procedure: CHONDROPLASTY;  Surgeon: Hiram Gash, MD;  Location: [place];  Service: Orthopedics;  Laterality: Right;  . HARDWARE REMOVAL Right 02/01/2018   Procedure: HARDWARE REMOVAL;  Surgeon: Hiram Gash, MD;  Location: [place];  Service: Orthopedics;  Laterality: Right;  . HERNIA REPAIR    . KNEE ARTHROSCOPY WITH ANTERIOR CRUCIATE LIGAMENT (ACL) REPAIR WITH HAMSTRING GRAFT Right 02/01/2018   Procedure: RIGHT KNEE ARTHROSCOPY WITH POSSIBLE REVISION ANTERIOR CRUCIATE LIGAMENT (ACL) REPAIR WITH AUTOGRAFT HAMSTRING;  Surgeon: Hiram Gash, MD;  Location: [place];  Service: Orthopedics;  Laterality: Right;  . KNEE ARTHROSCOPY WITH LATERAL MENISECTOMY Right 02/01/2018   Procedure: RIGHT KNEE ARTHROSCOPY WITH LATERAL MENISECTOMY;  Surgeon: Hiram Gash, MD;  Location: [place];  Service: Orthopedics;  Laterality: Right;  . KNEE ARTHROSCOPY WITH MEDIAL MENISECTOMY Right 02/01/2018   Procedure: RIGHT KNEE ARTHROSCOPY WITH MEDIAL  MENISECTOMY, CHONDROPLASTY;  Surgeon: Hiram Gash, MD;  Location: [place];  Service: Orthopedics;  Laterality: Right;  . MENISCUS REPAIR     left knee    There were no vitals filed for this visit.  Subjective Assessment - 04/19/18 1339    Subjective  I've had no pain, just some soreness in the front of the knee.          [facility] PT Assessment - 04/19/18 1346      AROM   Right Knee Extension  1    Right Knee Flexion  123                   [facility] Adult PT Treatment/Exercise - 04/19/18 0001      Knee/Hip Exercises: Stretches   Active Hamstring Stretch  2 reps;30 seconds   with strap   Quad Stretch  2 reps;30 seconds   prone   Gastroc Stretch  2 reps;Both;30 seconds    Soleus Stretch  2 reps;30 seconds;Both      Knee/Hip Exercises: Aerobic   Tread Mill  5.0 x 4 min     Stepper  L3 x 2:30, L 5 x 2:30       Knee/Hip Exercises: Standing   Step Down  2 sets;10 reps;Other (comment)   onto Bosu with alternate LE hip flexion   Other Standing Knee Exercises  squat on bosu 2 x 10   difficulty controlling BOSU wobble     Knee/Hip Exercises: Seated   Long  Arc Quad  2 sets;15 reps   2# with ball squeeze   Other Seated Knee/Hip Exercises  --    Other Seated Knee/Hip Exercises  squating holding on to bars 2 x 20      Knee/Hip Exercises: Supine   Quad Sets  1 set;20 reps   with ball squeeze for vmo facilitation     Manual Therapy   Manual therapy comments  MTPR along the vastus lateralis x 3    Soft tissue mobilization  DTM along the vastus lateralis          Balance Exercises - 04/19/18 1506      Balance Exercises: Standing   SLS  3 reps;30 secs   standing on Bosu ball in //         PT Short Term Goals - 04/10/18 1530      PT SHORT TERM GOAL #1   Title  pt to be I with inital HEp     Time  4    Period  Weeks    Status  Achieved      PT SHORT TERM GOAL #2   Title  pt to verbalize and demo techniques to reduce pain and  inflammation via RICE and HEP    Period  Weeks    Status  Achieved      PT SHORT TERM GOAL #3   Title  pt to demo good quad activation and SLR with </= 10 degree quad lag for functional progression    Time  4    Period  Weeks    Status  Achieved      PT SHORT TERM GOAL #4   Title  pt to increase knee flexion to >/= 90 degrees with </= 4/10 pain for therapuetic progression     Period  Weeks    Status  Achieved        PT Long Term Goals - 04/10/18 1531      PT LONG TERM GOAL #1   Title  increase ROM to </= 2 degrees and >/= 120 degrees with </= 1/10 pain for functional ROM required for efficent gait and ADLs    Baseline  pain at 3-4/10 with deep bending    Time  8    Period  Weeks    Status  On-going    Target Date  06/19/18      PT LONG TERM GOAL #2   Title  pt to demo RLE strength to >/= 4+/5 in all planes to promote knee stability with standing/ walking and dynamic activities    Time  8    Period  Weeks    Status  On-going      PT LONG TERM GOAL #3   Title  pt to be able to perfor dynamic activities and jogging/ hopping with no report of pain or instability for personal goal of returning to playing basketball     Time  8    Period  Weeks    Status  On-going      PT LONG TERM GOAL #4   Title  increase FOTO score to </= 34% limited to demo improvement in function    Time  8    Period  Weeks    Status  On-going      PT LONG TERM GOAL #5   Title  pt to be I with all HEP given as of last visit to maintain and progress current level of function independently.     Period  Weeks    Status  On-going            Plan - 04/19/18 1504    Clinical Impression Statement  Joshua Gaines is improving knee ROM to 1-123 degrees today. continued working on hip/ knee strengthening / balance progressing per protocol. began treadmill running monitoring pt throughout treatment, he reported no pain.     PT Treatment/Interventions  ADLs/Self Care Home Management;Cryotherapy;Iontophoresis  4mg /ml Dexamethasone;Electrical Stimulation;Moist Heat;Ultrasound;Balance training;Neuromuscular re-education;Therapeutic activities;Therapeutic exercise;Manual techniques;Vasopneumatic Device;Gait training;Patient/family education;Taping    PT Next Visit Plan  going on 12 weeks post op, update HEP PRN, bike knee ROM, quad activation, hip strength, ankle strength, vaso for swelling PRN, light plyo    PT Home Exercise Plan  quad set, standing weight shifting for involuntary quad activation, hip abduction, heel raise (in brace), heel slides with strap, SAQ    Consulted and Agree with Plan of Care  Patient       Patient will benefit from skilled therapeutic intervention in order to improve the following deficits and impairments:  Abnormal gait, Pain, Decreased strength, Increased fascial restricitons, Increased edema, Decreased balance, Decreased endurance, Improper body mechanics, Postural dysfunction, Decreased range of motion  Visit Diagnosis: Acute pain of right knee  Stiffness of right knee, not elsewhere classified  Localized edema  Muscle weakness (generalized)  Other abnormalities of gait and mobility     Problem List Patient Active Problem List   Diagnosis Date Noted  . Localized osteoarthritis of left knee 03/02/2018  . Annual physical exam 12/23/2016  . Acute meniscal tear of right knee 02/27/2015  . Right foot pain 01/19/2013   Starr Lake PT, DPT, LAT, ATC  04/19/18  3:08 PM      [place] [facility] [address] [place], [place], [address] Phone: [phone]   Fax:  [phone]  Name: Joshua Gaines MRN: [id number] Date of Birth: 08/07/1980

## 2018-04-21 ENCOUNTER — Encounter: Payer: Self-pay | Admitting: Physical Therapy

## 2018-04-21 ENCOUNTER — Ambulatory Visit: Payer: 59 | Admitting: Physical Therapy

## 2018-04-21 DIAGNOSIS — M25561 Pain in right knee: Secondary | ICD-10-CM

## 2018-04-21 DIAGNOSIS — R2689 Other abnormalities of gait and mobility: Secondary | ICD-10-CM | POA: Diagnosis not present

## 2018-04-21 DIAGNOSIS — M25661 Stiffness of right knee, not elsewhere classified: Secondary | ICD-10-CM

## 2018-04-21 DIAGNOSIS — R600 Localized edema: Secondary | ICD-10-CM

## 2018-04-21 DIAGNOSIS — M6281 Muscle weakness (generalized): Secondary | ICD-10-CM | POA: Diagnosis not present

## 2018-04-21 NOTE — Therapy (Signed)
[place], [place], [address] Phone: [phone]   Fax:  [phone]  Physical Therapy Treatment  Patient Details  Name: Joshua Gaines MRN: [id number] Date of Birth: 1981-03-05 Referring Provider (PT): Ophelia Charter MD   Encounter Date: 04/21/2018  PT End of Session - 04/21/18 0849    Visit Number  16    Number of Visits  24    Date for PT Re-Evaluation  06/19/18    Authorization Type  [facility] [employer]    PT Start Time  0847    PT Stop Time  0930    PT Time Calculation (min)  43 min    Activity Tolerance  Patient tolerated treatment well    Behavior During Therapy  [facility] for tasks assessed/performed       Past Medical History:  Diagnosis Date  . GERD (gastroesophageal reflux disease)     Past Surgical History:  Procedure Laterality Date  . ANTERIOR CRUCIATE LIGAMENT REPAIR  [id number]  . CHONDROPLASTY Right 02/01/2018   Procedure: CHONDROPLASTY;  Surgeon: Hiram Gash, MD;  Location: [place];  Service: Orthopedics;  Laterality: Right;  . HARDWARE REMOVAL Right 02/01/2018   Procedure: HARDWARE REMOVAL;  Surgeon: Hiram Gash, MD;  Location: [place];  Service: Orthopedics;  Laterality: Right;  . HERNIA REPAIR    . KNEE ARTHROSCOPY WITH ANTERIOR CRUCIATE LIGAMENT (ACL) REPAIR WITH HAMSTRING GRAFT Right 02/01/2018   Procedure: RIGHT KNEE ARTHROSCOPY WITH POSSIBLE REVISION ANTERIOR CRUCIATE LIGAMENT (ACL) REPAIR WITH AUTOGRAFT HAMSTRING;  Surgeon: Hiram Gash, MD;  Location: [place];  Service: Orthopedics;  Laterality: Right;  . KNEE ARTHROSCOPY WITH LATERAL MENISECTOMY Right 02/01/2018   Procedure: RIGHT KNEE ARTHROSCOPY WITH LATERAL MENISECTOMY;  Surgeon: Hiram Gash, MD;  Location: [place];  Service: Orthopedics;  Laterality: Right;  . KNEE ARTHROSCOPY WITH MEDIAL MENISECTOMY Right 02/01/2018   Procedure: RIGHT KNEE ARTHROSCOPY WITH MEDIAL  MENISECTOMY, CHONDROPLASTY;  Surgeon: Hiram Gash, MD;  Location: [place];  Service: Orthopedics;  Laterality: Right;  . MENISCUS REPAIR     left knee    There were no vitals filed for this visit.      [facility] PT Assessment - 04/21/18 0001      High Level Balance   High Level Balance Comments  single limb 3 hop test: L foot 20 ft, RLE 10 ft                   [facility] Adult PT Treatment/Exercise - 04/21/18 0001      Knee/Hip Exercises: Stretches   Active Hamstring Stretch  2 reps;30 seconds    Quad Stretch  2 reps;30 seconds    Gastroc Stretch  2 reps;Both;30 seconds   slant     Knee/Hip Exercises: Aerobic   Stepper  L5 x 5 min       Knee/Hip Exercises: Plyometrics   Other Plyometric Exercises  pre-jumping activity 2 x 12, eccentric squat and raising up onto the toes    using mirror for visual cues     Knee/Hip Exercises: Standing   Other Standing Knee Exercises  TRX pistol squat 2 x 10 RLE only      Knee/Hip Exercises: Seated   Long Arc Quad  2 sets;15 reps;Weights    Long Arc Quad Weight  4 lbs.    Other Seated Knee/Hip Exercises  russian eccentric hamstring curls 2 x 10  Balance Exercises - 04/21/18 0936      Balance Exercises: Standing   SLS  Eyes open;Foam/compliant surface;15 secs   2 x facing R, 2 x facing L, 1 x forward with yellowball         PT Short Term Goals - 04/10/18 1530      PT SHORT TERM GOAL #1   Title  pt to be I with inital HEp     Time  4    Period  Weeks    Status  Achieved      PT SHORT TERM GOAL #2   Title  pt to verbalize and demo techniques to reduce pain and inflammation via RICE and HEP    Period  Weeks    Status  Achieved      PT SHORT TERM GOAL #3   Title  pt to demo good quad activation and SLR with </= 10 degree quad lag for functional progression    Time  4    Period  Weeks    Status  Achieved      PT SHORT TERM GOAL #4   Title  pt to increase knee flexion to >/= 90 degrees  with </= 4/10 pain for therapuetic progression     Period  Weeks    Status  Achieved        PT Long Term Goals - 04/10/18 1531      PT LONG TERM GOAL #1   Title  increase ROM to </= 2 degrees and >/= 120 degrees with </= 1/10 pain for functional ROM required for efficent gait and ADLs    Baseline  pain at 3-4/10 with deep bending    Time  8    Period  Weeks    Status  On-going    Target Date  06/19/18      PT LONG TERM GOAL #2   Title  pt to demo RLE strength to >/= 4+/5 in all planes to promote knee stability with standing/ walking and dynamic activities    Time  8    Period  Weeks    Status  On-going      PT LONG TERM GOAL #3   Title  pt to be able to perfor dynamic activities and jogging/ hopping with no report of pain or instability for personal goal of returning to playing basketball     Time  8    Period  Weeks    Status  On-going      PT LONG TERM GOAL #4   Title  increase FOTO score to </= 34% limited to demo improvement in function    Time  8    Period  Weeks    Status  On-going      PT LONG TERM GOAL #5   Title  pt to be I with all HEP given as of last visit to maintain and progress current level of function independently.     Period  Weeks    Status  On-going            Plan - 04/21/18 0940    Clinical Impression Statement  continued progressing aerobic endurenace and quad/ hamstring strengtheing. began practicing pre-jumping activity using mirror for visual feedback. assessed single limb hop with he got 63ft on the LLE, and 10 on the RLE.     PT Next Visit Plan  going on 13 weeks post op, update HEP PRN,treadmill,knee/ hip strength, ankle strength,balance and progressing plyometric activity vaso PRN  PT Home Exercise Plan  quad set, standing weight shifting for involuntary quad activation, hip abduction, heel raise (in brace), heel slides with strap, SAQ    Consulted and Agree with Plan of Care  Patient       Patient will benefit from skilled  therapeutic intervention in order to improve the following deficits and impairments:  Abnormal gait, Pain, Decreased strength, Increased fascial restricitons, Increased edema, Decreased balance, Decreased endurance, Improper body mechanics, Postural dysfunction, Decreased range of motion  Visit Diagnosis: Acute pain of right knee  Stiffness of right knee, not elsewhere classified  Localized edema  Muscle weakness (generalized)  Other abnormalities of gait and mobility     Problem List Patient Active Problem List   Diagnosis Date Noted  . Localized osteoarthritis of left knee 03/02/2018  . Annual physical exam 12/23/2016  . Acute meniscal tear of right knee 02/27/2015  . Right foot pain 01/19/2013   Starr Lake PT, DPT, LAT, ATC  04/21/18  9:42 AM      [facility] [address] [place], [place], [address] Phone: [phone]   Fax:  [phone]  Name: Joshua Gaines MRN: [id number] Date of Birth: January 09, 1981

## 2018-05-01 ENCOUNTER — Encounter: Payer: Self-pay | Admitting: Physical Therapy

## 2018-05-01 ENCOUNTER — Ambulatory Visit: Payer: 59 | Admitting: Physical Therapy

## 2018-05-01 DIAGNOSIS — R2689 Other abnormalities of gait and mobility: Secondary | ICD-10-CM | POA: Diagnosis not present

## 2018-05-01 DIAGNOSIS — M25661 Stiffness of right knee, not elsewhere classified: Secondary | ICD-10-CM

## 2018-05-01 DIAGNOSIS — M25561 Pain in right knee: Secondary | ICD-10-CM

## 2018-05-01 DIAGNOSIS — R600 Localized edema: Secondary | ICD-10-CM

## 2018-05-01 DIAGNOSIS — M6281 Muscle weakness (generalized): Secondary | ICD-10-CM | POA: Diagnosis not present

## 2018-05-01 NOTE — Therapy (Signed)
[place], [place], [address] Phone: [phone]   Fax:  [phone]  Physical Therapy Treatment  Patient Details  Name: Joshua Gaines MRN: [id number] Date of Birth: Jul 14, 1980 Referring Provider (PT): Ophelia Charter MD   Encounter Date: 05/01/2018  PT End of Session - 05/01/18 1457    Visit Number  17    Number of Visits  24    Date for PT Re-Evaluation  06/19/18    Authorization Type  [facility]    PT Start Time  0109    PT Stop Time  1540    PT Time Calculation (min)  43 min    Activity Tolerance  Patient tolerated treatment well    Behavior During Therapy  [facility] for tasks assessed/performed       Past Medical History:  Diagnosis Date  . GERD (gastroesophageal reflux disease)     Past Surgical History:  Procedure Laterality Date  . ANTERIOR CRUCIATE LIGAMENT REPAIR  [id number]  . CHONDROPLASTY Right 02/01/2018   Procedure: CHONDROPLASTY;  Surgeon: Hiram Gash, MD;  Location: [place];  Service: Orthopedics;  Laterality: Right;  . HARDWARE REMOVAL Right 02/01/2018   Procedure: HARDWARE REMOVAL;  Surgeon: Hiram Gash, MD;  Location: [place];  Service: Orthopedics;  Laterality: Right;  . HERNIA REPAIR    . KNEE ARTHROSCOPY WITH ANTERIOR CRUCIATE LIGAMENT (ACL) REPAIR WITH HAMSTRING GRAFT Right 02/01/2018   Procedure: RIGHT KNEE ARTHROSCOPY WITH POSSIBLE REVISION ANTERIOR CRUCIATE LIGAMENT (ACL) REPAIR WITH AUTOGRAFT HAMSTRING;  Surgeon: Hiram Gash, MD;  Location: [place];  Service: Orthopedics;  Laterality: Right;  . KNEE ARTHROSCOPY WITH LATERAL MENISECTOMY Right 02/01/2018   Procedure: RIGHT KNEE ARTHROSCOPY WITH LATERAL MENISECTOMY;  Surgeon: Hiram Gash, MD;  Location: [place];  Service: Orthopedics;  Laterality: Right;  . KNEE ARTHROSCOPY WITH MEDIAL MENISECTOMY Right 02/01/2018   Procedure: RIGHT KNEE ARTHROSCOPY WITH MEDIAL  MENISECTOMY, CHONDROPLASTY;  Surgeon: Hiram Gash, MD;  Location: [place];  Service: Orthopedics;  Laterality: Right;  . MENISCUS REPAIR     left knee    There were no vitals filed for this visit.  Subjective Assessment - 05/01/18 1457    Subjective  " I am doing pretty good"     Currently in Pain?  No/denies    Pain Orientation  Right         [facility] PT Assessment - 05/01/18 0001      Assessment   Medical Diagnosis  S/P R ACL Reconstruction (hamstring graft)    Referring Provider (PT)  Ophelia Charter MD                   [facility] Adult PT Treatment/Exercise - 05/01/18 1505      Knee/Hip Exercises: Stretches   Active Hamstring Stretch  2 reps;30 seconds    Quad Stretch  2 reps;30 seconds   prone     Knee/Hip Exercises: Aerobic   Stepper  L5 x 4 min      Knee/Hip Exercises: Plyometrics   Other Plyometric Exercises  toe taps around Bosu 2 x CW/ 2 x CCW x 30 seconds    Other Plyometric Exercises  ladder drills high knees x 4, bunny hops 2 x going slow, 2 x quickly, lateral shuffle 1 x 25% of max speed  1x 50% max speed      Knee/Hip Exercises: Standing   Forward Lunges  2 sets;10  reps   with RLE on the Bosu ball   Side Lunges  2 sets;10 reps   on Bosu ball   Other Standing Knee Exercises  TRX pistol squat 2 x 10 RLE only      Knee/Hip Exercises: Seated   Long Arc Quad  2 sets;15 reps    Long Arc Quad Weight  4 lbs.      Knee/Hip Exercises: Supine   Bridges  Strengthening;2 sets;15 reps   with heels on black roam roll   Single Leg Bridge  2 sets;20 reps;Right;Strengthening   keeping pelvis level              PT Short Term Goals - 04/10/18 1530      PT SHORT TERM GOAL #1   Title  pt to be I with inital HEp     Time  4    Period  Weeks    Status  Achieved      PT SHORT TERM GOAL #2   Title  pt to verbalize and demo techniques to reduce pain and inflammation via RICE and HEP    Period  Weeks    Status  Achieved      PT SHORT  TERM GOAL #3   Title  pt to demo good quad activation and SLR with </= 10 degree quad lag for functional progression    Time  4    Period  Weeks    Status  Achieved      PT SHORT TERM GOAL #4   Title  pt to increase knee flexion to >/= 90 degrees with </= 4/10 pain for therapuetic progression     Period  Weeks    Status  Achieved        PT Long Term Goals - 04/10/18 1531      PT LONG TERM GOAL #1   Title  increase ROM to </= 2 degrees and >/= 120 degrees with </= 1/10 pain for functional ROM required for efficent gait and ADLs    Baseline  pain at 3-4/10 with deep bending    Time  8    Period  Weeks    Status  On-going    Target Date  06/19/18      PT LONG TERM GOAL #2   Title  pt to demo RLE strength to >/= 4+/5 in all planes to promote knee stability with standing/ walking and dynamic activities    Time  8    Period  Weeks    Status  On-going      PT LONG TERM GOAL #3   Title  pt to be able to perfor dynamic activities and jogging/ hopping with no report of pain or instability for personal goal of returning to playing basketball     Time  8    Period  Weeks    Status  On-going      PT LONG TERM GOAL #4   Title  increase FOTO score to </= 34% limited to demo improvement in function    Time  8    Period  Weeks    Status  On-going      PT LONG TERM GOAL #5   Title  pt to be I with all HEP given as of last visit to maintain and progress current level of function independently.     Period  Weeks    Status  On-going            Plan - 05/01/18  1532    Clinical Impression Statement  Continued working on hip/ knee strengthening with progress per protocol. continued working on plyometric progression with lateral movements which he did well with reporting just feeling weird but no pain today.    PT Next Visit Plan  going on 13 weeks post op, update HEP PRN,treadmill,knee/ hip strength, balance and progressing plyometric activity vaso PRN    PT Home Exercise Plan  quad  set, standing weight shifting for involuntary quad activation, hip abduction, heel raise (in brace), heel slides with strap, SAQ    Consulted and Agree with Plan of Care  Patient       Patient will benefit from skilled therapeutic intervention in order to improve the following deficits and impairments:  Abnormal gait, Pain, Decreased strength, Increased fascial restricitons, Increased edema, Decreased balance, Decreased endurance, Improper body mechanics, Postural dysfunction, Decreased range of motion  Visit Diagnosis: Acute pain of right knee  Stiffness of right knee, not elsewhere classified  Localized edema  Muscle weakness (generalized)  Other abnormalities of gait and mobility     Problem List Patient Active Problem List   Diagnosis Date Noted  . Localized osteoarthritis of left knee 03/02/2018  . Annual physical exam 12/23/2016  . Acute meniscal tear of right knee 02/27/2015  . Right foot pain 01/19/2013   Starr Lake PT, DPT, LAT, ATC  05/01/18  3:43 PM      [facility] [address] [place], [place], [address] Phone: [phone]   Fax:  [phone]  Name: Joshua Gaines MRN: [id number] Date of Birth: 03/30/1981

## 2018-05-04 ENCOUNTER — Encounter: Payer: Self-pay | Admitting: Physical Therapy

## 2018-05-04 ENCOUNTER — Ambulatory Visit: Payer: 59 | Attending: Orthopaedic Surgery | Admitting: Physical Therapy

## 2018-05-04 DIAGNOSIS — R2689 Other abnormalities of gait and mobility: Secondary | ICD-10-CM | POA: Diagnosis present

## 2018-05-04 DIAGNOSIS — M25561 Pain in right knee: Secondary | ICD-10-CM | POA: Diagnosis present

## 2018-05-04 DIAGNOSIS — R600 Localized edema: Secondary | ICD-10-CM | POA: Diagnosis present

## 2018-05-04 DIAGNOSIS — M25661 Stiffness of right knee, not elsewhere classified: Secondary | ICD-10-CM | POA: Diagnosis present

## 2018-05-04 DIAGNOSIS — M6281 Muscle weakness (generalized): Secondary | ICD-10-CM

## 2018-05-04 NOTE — Therapy (Signed)
[place], [place], [address] Phone: [phone]   Fax:  [phone]  Physical Therapy Treatment  Patient Details  Name: Joshua Gaines MRN: [id number] Date of Birth: May 24, 1980 Referring Provider (PT): Ophelia Charter MD   Encounter Date: 05/04/2018  PT End of Session - 05/04/18 1547    Visit Number  18    Number of Visits  24    Date for PT Re-Evaluation  06/19/18    PT Start Time  1502    PT Stop Time  1544    PT Time Calculation (min)  42 min    Activity Tolerance  Patient tolerated treatment well    Behavior During Therapy  [facility] for tasks assessed/performed       Past Medical History:  Diagnosis Date  . GERD (gastroesophageal reflux disease)     Past Surgical History:  Procedure Laterality Date  . ANTERIOR CRUCIATE LIGAMENT REPAIR  [id number]  . CHONDROPLASTY Right 02/01/2018   Procedure: CHONDROPLASTY;  Surgeon: Hiram Gash, MD;  Location: [place];  Service: Orthopedics;  Laterality: Right;  . HARDWARE REMOVAL Right 02/01/2018   Procedure: HARDWARE REMOVAL;  Surgeon: Hiram Gash, MD;  Location: [place];  Service: Orthopedics;  Laterality: Right;  . HERNIA REPAIR    . KNEE ARTHROSCOPY WITH ANTERIOR CRUCIATE LIGAMENT (ACL) REPAIR WITH HAMSTRING GRAFT Right 02/01/2018   Procedure: RIGHT KNEE ARTHROSCOPY WITH POSSIBLE REVISION ANTERIOR CRUCIATE LIGAMENT (ACL) REPAIR WITH AUTOGRAFT HAMSTRING;  Surgeon: Hiram Gash, MD;  Location: [place];  Service: Orthopedics;  Laterality: Right;  . KNEE ARTHROSCOPY WITH LATERAL MENISECTOMY Right 02/01/2018   Procedure: RIGHT KNEE ARTHROSCOPY WITH LATERAL MENISECTOMY;  Surgeon: Hiram Gash, MD;  Location: [place];  Service: Orthopedics;  Laterality: Right;  . KNEE ARTHROSCOPY WITH MEDIAL MENISECTOMY Right 02/01/2018   Procedure: RIGHT KNEE ARTHROSCOPY WITH MEDIAL MENISECTOMY, CHONDROPLASTY;   Surgeon: Hiram Gash, MD;  Location: [place];  Service: Orthopedics;  Laterality: Right;  . MENISCUS REPAIR     left knee    There were no vitals filed for this visit.  Subjective Assessment - 05/04/18 1504    Subjective  " been doing exercise at the gym, some soreness but not bad"    Patient Stated Goals  return back to normal as much as possible, playing basketball,                        [facility] Adult PT Treatment/Exercise - 05/04/18 0001      Knee/Hip Exercises: Stretches   Active Hamstring Stretch  2 reps;30 seconds    Quad Stretch  2 reps;30 seconds    Gastroc Stretch  2 reps;30 seconds   slant board   Soleus Stretch  1 rep;30 seconds   slant board     Knee/Hip Exercises: Aerobic   Tread Mill  5.0 x 5 min       Knee/Hip Exercises: Machines for Strengthening   Cybex Knee Extension  2 x 10 with 10#   up with both down with RLE only   Cybex Knee Flexion  RLE only 2 x 15 20#    Cybex Leg Press  2 x 15 RLE only 40#, 1 x 15 50#      Knee/Hip Exercises: Plyometrics   Other Plyometric Exercises  4 square lateral and diagonal 3 x 30 seconds bil LE      Knee/Hip  Exercises: Standing   Other Standing Knee Exercises  pistol squat 1 x 10 RLE only    Other Standing Knee Exercises  squat on bosu 1 x 10          Balance Exercises - 05/04/18 1527      Balance Exercises: Standing   Rebounder  Single leg;Foam/compliant surface;15 reps;Other reps (comment)   facing forward to the L/ R with yellow         PT Short Term Goals - 04/10/18 1530      PT SHORT TERM GOAL #1   Title  pt to be I with inital HEp     Time  4    Period  Weeks    Status  Achieved      PT SHORT TERM GOAL #2   Title  pt to verbalize and demo techniques to reduce pain and inflammation via RICE and HEP    Period  Weeks    Status  Achieved      PT SHORT TERM GOAL #3   Title  pt to demo good quad activation and SLR with </= 10 degree quad lag for functional  progression    Time  4    Period  Weeks    Status  Achieved      PT SHORT TERM GOAL #4   Title  pt to increase knee flexion to >/= 90 degrees with </= 4/10 pain for therapuetic progression     Period  Weeks    Status  Achieved        PT Long Term Goals - 04/10/18 1531      PT LONG TERM GOAL #1   Title  increase ROM to </= 2 degrees and >/= 120 degrees with </= 1/10 pain for functional ROM required for efficent gait and ADLs    Baseline  pain at 3-4/10 with deep bending    Time  8    Period  Weeks    Status  On-going    Target Date  06/19/18      PT LONG TERM GOAL #2   Title  pt to demo RLE strength to >/= 4+/5 in all planes to promote knee stability with standing/ walking and dynamic activities    Time  8    Period  Weeks    Status  On-going      PT LONG TERM GOAL #3   Title  pt to be able to perfor dynamic activities and jogging/ hopping with no report of pain or instability for personal goal of returning to playing basketball     Time  8    Period  Weeks    Status  On-going      PT LONG TERM GOAL #4   Title  increase FOTO score to </= 34% limited to demo improvement in function    Time  8    Period  Weeks    Status  On-going      PT LONG TERM GOAL #5   Title  pt to be I with all HEP given as of last visit to maintain and progress current level of function independently.     Period  Weeks    Status  On-going            Plan - 05/04/18 1528    Clinical Impression Statement  pt reports no soreness and has been consistent with exercises at gym. continued with hip/ knee strengthening adding double limb lateral hops in the ladder. He  conitnues to make progress appropriately.     PT Next Visit Plan  14 weeks post op, update HEP PRN,treadmill,knee/ hip strength, balance and progressing plyometric activity vaso PRN    PT Home Exercise Plan  quad set, standing weight shifting for involuntary quad activation, hip abduction, heel raise (in brace), heel slides with strap,  SAQ    Consulted and Agree with Plan of Care  Patient       Patient will benefit from skilled therapeutic intervention in order to improve the following deficits and impairments:  Abnormal gait, Pain, Decreased strength, Increased fascial restricitons, Increased edema, Decreased balance, Decreased endurance, Improper body mechanics, Postural dysfunction, Decreased range of motion  Visit Diagnosis: Acute pain of right knee  Stiffness of right knee, not elsewhere classified  Localized edema  Muscle weakness (generalized)  Other abnormalities of gait and mobility     Problem List Patient Active Problem List   Diagnosis Date Noted  . Localized osteoarthritis of left knee 03/02/2018  . Annual physical exam 12/23/2016  . Acute meniscal tear of right knee 02/27/2015  . Right foot pain 01/19/2013    Starr Lake PT, DPT, LAT, ATC  05/04/18  3:48 PM      [facility] [address] [place], [place], [address] Phone: [phone]   Fax:  [phone]  Name: Joshua Gaines MRN: [id number] Date of Birth: 1981-04-16

## 2018-05-12 ENCOUNTER — Encounter

## 2018-05-16 ENCOUNTER — Ambulatory Visit: Payer: 59 | Admitting: Physical Therapy

## 2018-05-16 ENCOUNTER — Encounter: Payer: Self-pay | Admitting: Physical Therapy

## 2018-05-16 DIAGNOSIS — R600 Localized edema: Secondary | ICD-10-CM

## 2018-05-16 DIAGNOSIS — M25561 Pain in right knee: Secondary | ICD-10-CM | POA: Diagnosis not present

## 2018-05-16 DIAGNOSIS — R2689 Other abnormalities of gait and mobility: Secondary | ICD-10-CM | POA: Diagnosis not present

## 2018-05-16 DIAGNOSIS — M6281 Muscle weakness (generalized): Secondary | ICD-10-CM

## 2018-05-16 DIAGNOSIS — M2341 Loose body in knee, right knee: Secondary | ICD-10-CM | POA: Diagnosis not present

## 2018-05-16 DIAGNOSIS — M25661 Stiffness of right knee, not elsewhere classified: Secondary | ICD-10-CM

## 2018-05-16 NOTE — Therapy (Signed)
[name], [place], [address] Phone: [phone]   Fax:  [phone]  Physical Therapy Treatment  Patient Details  Name: Joshua Gaines MRN: [id number] Date of Birth: May 04, 1980 Referring Provider (PT): Ophelia Charter MD   Encounter Date: 05/16/2018  PT End of Session - 05/16/18 1509    Visit Number  19    Number of Visits  24    Date for PT Re-Evaluation  06/19/18    Authorization Type  [facility] U[facility]    PT Start Time  1504    PT Stop Time  1545    PT Time Calculation (min)  41 min    Activity Tolerance  Patient tolerated treatment well    Behavior During Therapy  [facility] for tasks assessed/performed       Past Medical History:  Diagnosis Date  . GERD (gastroesophageal reflux disease)     Past Surgical History:  Procedure Laterality Date  . ANTERIOR CRUCIATE LIGAMENT REPAIR  [id number]  . CHONDROPLASTY Right 02/01/2018   Procedure: CHONDROPLASTY;  Surgeon: Hiram Gash, MD;  Location: [place];  Service: Orthopedics;  Laterality: Right;  . HARDWARE REMOVAL Right 02/01/2018   Procedure: HARDWARE REMOVAL;  Surgeon: Hiram Gash, MD;  Location: [place];  Service: Orthopedics;  Laterality: Right;  . HERNIA REPAIR    . KNEE ARTHROSCOPY WITH ANTERIOR CRUCIATE LIGAMENT (ACL) REPAIR WITH HAMSTRING GRAFT Right 02/01/2018   Procedure: RIGHT KNEE ARTHROSCOPY WITH POSSIBLE REVISION ANTERIOR CRUCIATE LIGAMENT (ACL) REPAIR WITH AUTOGRAFT HAMSTRING;  Surgeon: Hiram Gash, MD;  Location: [place];  Service: Orthopedics;  Laterality: Right;  . KNEE ARTHROSCOPY WITH LATERAL MENISECTOMY Right 02/01/2018   Procedure: RIGHT KNEE ARTHROSCOPY WITH LATERAL MENISECTOMY;  Surgeon: Hiram Gash, MD;  Location: [place];  Service: Orthopedics;  Laterality: Right;  . KNEE ARTHROSCOPY WITH MEDIAL MENISECTOMY Right 02/01/2018   Procedure: RIGHT KNEE ARTHROSCOPY WITH MEDIAL  MENISECTOMY, CHONDROPLASTY;  Surgeon: Hiram Gash, MD;  Location: [place];  Service: Orthopedics;  Laterality: Right;  . MENISCUS REPAIR     left knee    There were no vitals filed for this visit.  Subjective Assessment - 05/16/18 1509    Subjective  "I saw the Md and he was pleased with the progress, but states I shoulder not be back to running yet and should only start running at 5 months post op despite what the protocol reads"    Patient Stated Goals  return back to normal as much as possible, playing basketball,     Currently in Pain?  No/denies    Pain Orientation  Right         [facility] PT Assessment - 05/16/18 0001      Assessment   Medical Diagnosis  S/P R ACL Reconstruction (hamstring graft)    Referring Provider (PT)  Ophelia Charter MD    Onset Date/Surgical Date  02/01/18    Hand Dominance  Right                   [facility] Adult PT Treatment/Exercise - 05/16/18 0001      Knee/Hip Exercises: Stretches   Quad Stretch  2 reps;30 seconds    Gastroc Stretch  30 seconds;1 rep    Soleus Stretch  1 rep;30 seconds      Knee/Hip Exercises: Aerobic   Stepper  L5 x 5 min      Knee/Hip Exercises: Machines for  Strengthening   Cybex Leg Press  SLS 3x 20 60#      Knee/Hip Exercises: Plyometrics   Other Plyometric Exercises  lateral single leg hops using TRX 2 x 10 using cones about 3 ft apart      Knee/Hip Exercises: Standing   Other Standing Knee Exercises  TRX pistol squat 2 x 20    Other Standing Knee Exercises  hip hinge RLE standing 2 x 15 with 25# kettlebell      Knee/Hip Exercises: Supine   Bridges  Both;Strengthening;2 sets;15 reps   with heels on red physioball rolling forward/ backward     Manual Therapy   Manual therapy comments  MTPR along the vastus lateralis x 2, glute medius x 2    Soft tissue mobilization  DTM along the vastus lateralis/ glute medius          Balance Exercises - 05/16/18 1625      Balance Exercises:  Standing   Rebounder  Single leg;Foam/compliant surface;15 reps;Other reps (comment)          PT Short Term Goals - 04/10/18 1530      PT SHORT TERM GOAL #1   Title  pt to be I with inital HEp     Time  4    Period  Weeks    Status  Achieved      PT SHORT TERM GOAL #2   Title  pt to verbalize and demo techniques to reduce pain and inflammation via RICE and HEP    Period  Weeks    Status  Achieved      PT SHORT TERM GOAL #3   Title  pt to demo good quad activation and SLR with </= 10 degree quad lag for functional progression    Time  4    Period  Weeks    Status  Achieved      PT SHORT TERM GOAL #4   Title  pt to increase knee flexion to >/= 90 degrees with </= 4/10 pain for therapuetic progression     Period  Weeks    Status  Achieved        PT Long Term Goals - 04/10/18 1531      PT LONG TERM GOAL #1   Title  increase ROM to </= 2 degrees and >/= 120 degrees with </= 1/10 pain for functional ROM required for efficent gait and ADLs    Baseline  pain at 3-4/10 with deep bending    Time  8    Period  Weeks    Status  On-going    Target Date  06/19/18      PT LONG TERM GOAL #2   Title  pt to demo RLE strength to >/= 4+/5 in all planes to promote knee stability with standing/ walking and dynamic activities    Time  8    Period  Weeks    Status  On-going      PT LONG TERM GOAL #3   Title  pt to be able to perfor dynamic activities and jogging/ hopping with no report of pain or instability for personal goal of returning to playing basketball     Time  8    Period  Weeks    Status  On-going      PT LONG TERM GOAL #4   Title  increase FOTO score to </= 34% limited to demo improvement in function    Time  8    Period  Weeks  Status  On-going      PT LONG TERM GOAL #5   Title  pt to be I with all HEP given as of last visit to maintain and progress current level of function independently.     Period  Weeks    Status  On-going            Plan -  05/16/18 1626    Clinical Impression Statement  pt reported seeing his MD earlier today which he is doing well. per physician he is not to doing any running/ jogging until 5 months post op and not to follow protocol per running restraint. continued working on hip/ knee strengthening and balance which he performed well. no pain noted at end of session.     PT Treatment/Interventions  ADLs/Self Care Home Management;Cryotherapy;Iontophoresis 4mg /ml Dexamethasone;Electrical Stimulation;Moist Heat;Ultrasound;Balance training;Neuromuscular re-education;Therapeutic activities;Therapeutic exercise;Manual techniques;Vasopneumatic Device;Gait training;Patient/family education;Taping    PT Next Visit Plan  15 weeks post op 05/17/2018, update HEP PRN,stepper vs ellipitcal (no running until 5 months post op),knee/ hip strength, balance and progressing plyometric activity vaso PRN    PT Home Exercise Plan  quad set, standing weight shifting for involuntary quad activation, hip abduction, heel raise (in brace), heel slides with strap, SAQ    Consulted and Agree with Plan of Care  Patient       Patient will benefit from skilled therapeutic intervention in order to improve the following deficits and impairments:  Abnormal gait, Pain, Decreased strength, Increased fascial restricitons, Increased edema, Decreased balance, Decreased endurance, Improper body mechanics, Postural dysfunction, Decreased range of motion  Visit Diagnosis: Acute pain of right knee  Stiffness of right knee, not elsewhere classified  Localized edema  Muscle weakness (generalized)  Other abnormalities of gait and mobility     Problem List Patient Active Problem List   Diagnosis Date Noted  . Localized osteoarthritis of left knee 03/02/2018  . Annual physical exam 12/23/2016  . Acute meniscal tear of right knee 02/27/2015  . Right foot pain 01/19/2013   Starr Lake PT, DPT, LAT, ATC  05/16/18  4:31 PM      [facility]  [facility] [address] [place], [place], [address] Phone: [phone]   Fax:  [phone]  Name: TAESHAWN HELFMAN MRN: [id number] Date of Birth: [birth year]

## 2018-05-24 ENCOUNTER — Ambulatory Visit: Payer: 59 | Admitting: Physical Therapy

## 2018-05-30 ENCOUNTER — Ambulatory Visit: Payer: 59 | Admitting: Physical Therapy

## 2018-05-30 ENCOUNTER — Encounter: Payer: Self-pay | Admitting: Physical Therapy

## 2018-05-30 DIAGNOSIS — M6281 Muscle weakness (generalized): Secondary | ICD-10-CM | POA: Diagnosis not present

## 2018-05-30 DIAGNOSIS — M25661 Stiffness of right knee, not elsewhere classified: Secondary | ICD-10-CM | POA: Diagnosis not present

## 2018-05-30 DIAGNOSIS — R600 Localized edema: Secondary | ICD-10-CM

## 2018-05-30 DIAGNOSIS — R2689 Other abnormalities of gait and mobility: Secondary | ICD-10-CM

## 2018-05-30 DIAGNOSIS — M25561 Pain in right knee: Secondary | ICD-10-CM | POA: Diagnosis not present

## 2018-05-30 NOTE — Therapy (Signed)
[place], [place], [address] Phone: [phone]   Fax:  [phone]  Physical Therapy Treatment  Patient Details  Name: Joshua Gaines MRN: [id number] Date of Birth: 07/10/1980 Referring Provider (PT): Ophelia Charter MD   Encounter Date: 05/30/2018  PT End of Session - 05/30/18 1500    Visit Number  20    Number of Visits  24    Date for PT Re-Evaluation  06/19/18    Authorization Type  [facility] U[facility]    PT Start Time  1500    PT Stop Time  1544    PT Time Calculation (min)  44 min    Activity Tolerance  Patient tolerated treatment well    Behavior During Therapy  [facility] for tasks assessed/performed       Past Medical History:  Diagnosis Date  . GERD (gastroesophageal reflux disease)     Past Surgical History:  Procedure Laterality Date  . ANTERIOR CRUCIATE LIGAMENT REPAIR  [id number]  . CHONDROPLASTY Right 02/01/2018   Procedure: CHONDROPLASTY;  Surgeon: Hiram Gash, MD;  Location: [place];  Service: Orthopedics;  Laterality: Right;  . HARDWARE REMOVAL Right 02/01/2018   Procedure: HARDWARE REMOVAL;  Surgeon: Hiram Gash, MD;  Location: [place];  Service: Orthopedics;  Laterality: Right;  . HERNIA REPAIR    . KNEE ARTHROSCOPY WITH ANTERIOR CRUCIATE LIGAMENT (ACL) REPAIR WITH HAMSTRING GRAFT Right 02/01/2018   Procedure: RIGHT KNEE ARTHROSCOPY WITH POSSIBLE REVISION ANTERIOR CRUCIATE LIGAMENT (ACL) REPAIR WITH AUTOGRAFT HAMSTRING;  Surgeon: Hiram Gash, MD;  Location: [place];  Service: Orthopedics;  Laterality: Right;  . KNEE ARTHROSCOPY WITH LATERAL MENISECTOMY Right 02/01/2018   Procedure: RIGHT KNEE ARTHROSCOPY WITH LATERAL MENISECTOMY;  Surgeon: Hiram Gash, MD;  Location: [place];  Service: Orthopedics;  Laterality: Right;  . KNEE ARTHROSCOPY WITH MEDIAL MENISECTOMY Right 02/01/2018   Procedure: RIGHT KNEE ARTHROSCOPY WITH MEDIAL  MENISECTOMY, CHONDROPLASTY;  Surgeon: Hiram Gash, MD;  Location: [place];  Service: Orthopedics;  Laterality: Right;  . MENISCUS REPAIR     left knee    There were no vitals filed for this visit.  Subjective Assessment - 05/30/18 1504    Subjective  " I am doing pretty good"     Patient Stated Goals  return back to normal as much as possible, playing basketball,     Currently in Pain?  No/denies         [facility] PT Assessment - 05/30/18 0001      Assessment   Medical Diagnosis  S/P R ACL Reconstruction (hamstring graft)    Referring Provider (PT)  Ophelia Charter MD    Onset Date/Surgical Date  02/01/18    Hand Dominance  Right                   [facility] Adult PT Treatment/Exercise - 05/30/18 0001      Knee/Hip Exercises: Stretches   Active Hamstring Stretch  2 reps;30 seconds    Quad Stretch  2 reps;30 seconds    Gastroc Stretch  2 reps;30 seconds      Knee/Hip Exercises: Aerobic   Elliptical  L5, x 6 min elevation L 15      Knee/Hip Exercises: Plyometrics   Bilateral Jumping  1 set;10 reps   for distance   Unilateral Jumping  2 sets;5 reps   RLE   Box Circuit Limitations  form 6 inch step  1 x 5    cues for proper form   Broad Jump  2 sets;5 reps    Other Plyometric Exercises  high knees for height 2 x 50 ft for height    Other Plyometric Exercises  [place] 4 x 30 ft      Knee/Hip Exercises: Standing   Forward Lunges  --   walk     Knee/Hip Exercises: Seated   Other Seated Knee/Hip Exercises  russian eccentric hamstring curls 2 x 15          Balance Exercises - 05/30/18 1521      Balance Exercises: Standing   Other Standing Exercises  cone taps standing SLS on airex pad 4 x 15 gradually moving cone farther away          PT Short Term Goals - 04/10/18 1530      PT SHORT TERM GOAL #1   Title  pt to be I with inital HEp     Time  4    Period  Weeks    Status  Achieved      PT SHORT TERM GOAL #2   Title  pt to verbalize  and demo techniques to reduce pain and inflammation via RICE and HEP    Period  Weeks    Status  Achieved      PT SHORT TERM GOAL #3   Title  pt to demo good quad activation and SLR with </= 10 degree quad lag for functional progression    Time  4    Period  Weeks    Status  Achieved      PT SHORT TERM GOAL #4   Title  pt to increase knee flexion to >/= 90 degrees with </= 4/10 pain for therapuetic progression     Period  Weeks    Status  Achieved        PT Long Term Goals - 04/10/18 1531      PT LONG TERM GOAL #1   Title  increase ROM to </= 2 degrees and >/= 120 degrees with </= 1/10 pain for functional ROM required for efficent gait and ADLs    Baseline  pain at 3-4/10 with deep bending    Time  8    Period  Weeks    Status  On-going    Target Date  06/19/18      PT LONG TERM GOAL #2   Title  pt to demo RLE strength to >/= 4+/5 in all planes to promote knee stability with standing/ walking and dynamic activities    Time  8    Period  Weeks    Status  On-going      PT LONG TERM GOAL #3   Title  pt to be able to perfor dynamic activities and jogging/ hopping with no report of pain or instability for personal goal of returning to playing basketball     Time  8    Period  Weeks    Status  On-going      PT LONG TERM GOAL #4   Title  increase FOTO score to </= 34% limited to demo improvement in function    Time  8    Period  Weeks    Status  On-going      PT LONG TERM GOAL #5   Title  pt to be I with all HEP given as of last visit to maintain and progress current level of function independently.  Period  Weeks    Status  On-going            Plan - 05/30/18 1632    Clinical Impression Statement  pt willbe 17 weeks post op tomorrow. continued strengthening with increased reps to promote endurance. progressed plyometrics working from double limb to single limb which he did well with. end of session he reported no pain .    PT Next Visit Plan  17 weeks post op  05/17/2018, update HEP PRN,stepper vs ellipitcal (no running until 5 months post op),knee/ hip strength, balance plyometrics    PT Home Exercise Plan  quad set, standing weight shifting for involuntary quad activation, hip abduction, heel raise (in brace), heel slides with strap, SAQ    Consulted and Agree with Plan of Care  Patient       Patient will benefit from skilled therapeutic intervention in order to improve the following deficits and impairments:  Abnormal gait, Pain, Decreased strength, Increased fascial restricitons, Increased edema, Decreased balance, Decreased endurance, Improper body mechanics, Postural dysfunction, Decreased range of motion  Visit Diagnosis: Acute pain of right knee  Stiffness of right knee, not elsewhere classified  Localized edema  Muscle weakness (generalized)  Other abnormalities of gait and mobility     Problem List Patient Active Problem List   Diagnosis Date Noted  . Localized osteoarthritis of left knee 03/02/2018  . Annual physical exam 12/23/2016  . Acute meniscal tear of right knee 02/27/2015  . Right foot pain 01/19/2013   Starr Lake PT, DPT, LAT, ATC  05/30/18  4:35 PM      [facility] [address] [place], [place], [address] Phone: [phone]   Fax:  [phone]  Name: COTTRELL GENTLES MRN: [id number] Date of Birth: 20-Jun-1980

## 2018-06-05 ENCOUNTER — Telehealth: Payer: Self-pay

## 2018-06-05 MED ORDER — OSELTAMIVIR PHOSPHATE 75 MG PO CAPS: 75.0000 mg | ORAL_CAPSULE | Freq: Every day | ORAL | 0 refills | Status: DC

## 2018-06-05 NOTE — Telephone Encounter (Signed)
Thank you so much for putting it in!

## 2018-06-05 NOTE — Telephone Encounter (Signed)
Joshua Gaines called and states his son was diagnosed with the flu. He does not have symptoms. He would like Tamiflu sent to [facility].   CMP Latest Ref Rng & Units 12/28/2017 12/23/2016  Glucose 65 - 99 mg/dL 83 88  BUN 7 - 25 mg/dL 16 21  Creatinine 0.60 - 1.35 mg/dL 1.11 1.06  Sodium 135 - 146 mmol/L 141 141  Potassium 3.5 - 5.3 mmol/L 4.3 4.2  Chloride 98 - 110 mmol/L 104 102  CO2 20 - 32 mmol/L 28 22  Calcium 8.6 - 10.3 mg/dL 9.8 9.8  Total Protein 6.1 - 8.1 g/dL 7.2 7.0  Total Bilirubin 0.2 - 1.2 mg/dL 1.2 0.9  Alkaline Phos 40 - 115 U/L - 58  AST 10 - 40 U/L 21 18  ALT 9 - 46 U/L 21 16

## 2018-06-06 NOTE — Telephone Encounter (Signed)
Patient advised.

## 2018-06-07 ENCOUNTER — Ambulatory Visit: Payer: 59 | Attending: Orthopaedic Surgery | Admitting: Physical Therapy

## 2018-06-07 ENCOUNTER — Encounter: Payer: Self-pay | Admitting: Physical Therapy

## 2018-06-07 DIAGNOSIS — M6281 Muscle weakness (generalized): Secondary | ICD-10-CM | POA: Diagnosis present

## 2018-06-07 DIAGNOSIS — R600 Localized edema: Secondary | ICD-10-CM | POA: Insufficient documentation

## 2018-06-07 DIAGNOSIS — M25661 Stiffness of right knee, not elsewhere classified: Secondary | ICD-10-CM | POA: Diagnosis present

## 2018-06-07 DIAGNOSIS — M25561 Pain in right knee: Secondary | ICD-10-CM | POA: Insufficient documentation

## 2018-06-07 DIAGNOSIS — R2689 Other abnormalities of gait and mobility: Secondary | ICD-10-CM | POA: Insufficient documentation

## 2018-06-07 NOTE — Therapy (Signed)
[place], [place], [address] Phone: [phone]   Fax:  [phone]  Physical Therapy Treatment  Patient Details  Name: Joshua Gaines MRN: [id number] Date of Birth: May 06, 1980 Referring Provider (PT): Ophelia Charter MD   Encounter Date: 06/07/2018  PT End of Session - 06/07/18 0939    Visit Number  21    Number of Visits  24    Date for PT Re-Evaluation  06/19/18    Authorization Type  [facility] UMR    PT Start Time  0931    PT Stop Time  1015    PT Time Calculation (min)  44 min    Activity Tolerance  Patient tolerated treatment well    Behavior During Therapy  [facility] for tasks assessed/performed       Past Medical History:  Diagnosis Date  . GERD (gastroesophageal reflux disease)     Past Surgical History:  Procedure Laterality Date  . ANTERIOR CRUCIATE LIGAMENT REPAIR  [id number]  . CHONDROPLASTY Right 02/01/2018   Procedure: CHONDROPLASTY;  Surgeon: Hiram Gash, MD;  Location: [place];  Service: Orthopedics;  Laterality: Right;  . HARDWARE REMOVAL Right 02/01/2018   Procedure: HARDWARE REMOVAL;  Surgeon: Hiram Gash, MD;  Location: [place];  Service: Orthopedics;  Laterality: Right;  . HERNIA REPAIR    . KNEE ARTHROSCOPY WITH ANTERIOR CRUCIATE LIGAMENT (ACL) REPAIR WITH HAMSTRING GRAFT Right 02/01/2018   Procedure: RIGHT KNEE ARTHROSCOPY WITH POSSIBLE REVISION ANTERIOR CRUCIATE LIGAMENT (ACL) REPAIR WITH AUTOGRAFT HAMSTRING;  Surgeon: Hiram Gash, MD;  Location: [place];  Service: Orthopedics;  Laterality: Right;  . KNEE ARTHROSCOPY WITH LATERAL MENISECTOMY Right 02/01/2018   Procedure: RIGHT KNEE ARTHROSCOPY WITH LATERAL MENISECTOMY;  Surgeon: Hiram Gash, MD;  Location: [place];  Service: Orthopedics;  Laterality: Right;  . KNEE ARTHROSCOPY WITH MEDIAL MENISECTOMY Right 02/01/2018   Procedure: RIGHT KNEE ARTHROSCOPY WITH MEDIAL  MENISECTOMY, CHONDROPLASTY;  Surgeon: Hiram Gash, MD;  Location: [place];  Service: Orthopedics;  Laterality: Right;  . MENISCUS REPAIR     left knee    There were no vitals filed for this visit.  Subjective Assessment - 06/07/18 0939    Subjective  "im and doing pretty good, no real issues"     Patient Stated Goals  return back to normal as much as possible, playing basketball,     Currently in Pain?  No/denies         [facility] PT Assessment - 06/07/18 0001      Assessment   Medical Diagnosis  S/P R ACL Reconstruction (hamstring graft)    Referring Provider (PT)  Ophelia Charter MD      High Level Balance   High Level Balance Comments  single limb 3 hop test: L foot 18 ft, RLE 15 ft                   [facility] Adult PT Treatment/Exercise - 06/07/18 0943      Knee/Hip Exercises: Aerobic   Elliptical  L5, x 8 min elevation L 15      Knee/Hip Exercises: Machines for Strengthening   Cybex Knee Extension  2 x 15 25# RLE only    Cybex Knee Flexion  RLE only 2 x 20 20#      Knee/Hip Exercises: Plyometrics   Bilateral Jumping  1 set;10 reps    Unilateral Jumping  2 sets;10 reps  ladder drill forward then laterally   Unilateral Jumping Limitations  zig-zag motion 2 x 8 hops,    small jumps    Box Circuit Limitations  from 6 inch 1 x 10, focus on jump height after landing from box    Other Plyometric Exercises  --      Knee/Hip Exercises: Standing   Functional Squat  1 set;20 reps   on reverse Bosu     Manual Therapy   Soft tissue mobilization  IASTM and cross frictoin massage along the patellar tendon               PT Short Term Goals - 04/10/18 1530      PT SHORT TERM GOAL #1   Title  pt to be I with inital HEp     Time  4    Period  Weeks    Status  Achieved      PT SHORT TERM GOAL #2   Title  pt to verbalize and demo techniques to reduce pain and inflammation via RICE and HEP    Period  Weeks    Status  Achieved      PT SHORT  TERM GOAL #3   Title  pt to demo good quad activation and SLR with </= 10 degree quad lag for functional progression    Time  4    Period  Weeks    Status  Achieved      PT SHORT TERM GOAL #4   Title  pt to increase knee flexion to >/= 90 degrees with </= 4/10 pain for therapuetic progression     Period  Weeks    Status  Achieved        PT Long Term Goals - 04/10/18 1531      PT LONG TERM GOAL #1   Title  increase ROM to </= 2 degrees and >/= 120 degrees with </= 1/10 pain for functional ROM required for efficent gait and ADLs    Baseline  pain at 3-4/10 with deep bending    Time  8    Period  Weeks    Status  On-going    Target Date  06/19/18      PT LONG TERM GOAL #2   Title  pt to demo RLE strength to >/= 4+/5 in all planes to promote knee stability with standing/ walking and dynamic activities    Time  8    Period  Weeks    Status  On-going      PT LONG TERM GOAL #3   Title  pt to be able to perfor dynamic activities and jogging/ hopping with no report of pain or instability for personal goal of returning to playing basketball     Time  8    Period  Weeks    Status  On-going      PT LONG TERM GOAL #4   Title  increase FOTO score to </= 34% limited to demo improvement in function    Time  8    Period  Weeks    Status  On-going      PT LONG TERM GOAL #5   Title  pt to be I with all HEP given as of last visit to maintain and progress current level of function independently.     Period  Weeks    Status  On-going            Plan - 06/07/18 1023    Clinical Impression Statement  pt is 18 weeks post op today. continued working on dynamic activity progressing from double to single leg. He did report soreness inthe patellar tendon, utilized IASTM techniques to calm down soreness. He continues to make great progress with PT.     PT Next Visit Plan  19 weeks post op, update HEP PRN,stepper vs ellipitcal (no running until 5 months post op),knee/ hip strength, balance  plyometrics, cutting    PT Home Exercise Plan  quad set, standing weight shifting for involuntary quad activation, hip abduction, heel raise (in brace), heel slides with strap, SAQ    Consulted and Agree with Plan of Care  Patient       Patient will benefit from skilled therapeutic intervention in order to improve the following deficits and impairments:  Abnormal gait, Pain, Decreased strength, Increased fascial restricitons, Increased edema, Decreased balance, Decreased endurance, Improper body mechanics, Postural dysfunction, Decreased range of motion  Visit Diagnosis: Acute pain of right knee  Stiffness of right knee, not elsewhere classified  Localized edema  Muscle weakness (generalized)  Other abnormalities of gait and mobility     Problem List Patient Active Problem List   Diagnosis Date Noted  . Localized osteoarthritis of left knee 03/02/2018  . Annual physical exam 12/23/2016  . Acute meniscal tear of right knee 02/27/2015  . Right foot pain 01/19/2013   Starr Lake PT, DPT, LAT, ATC  06/07/18  10:25 AM      [facility] [address] [place], [place], [address] Phone: [phone]   Fax:  [phone]  Name: BOCEPHUS CALI MRN: [id number] Date of Birth: [birth year]

## 2018-06-14 ENCOUNTER — Ambulatory Visit: Payer: 59 | Admitting: Physical Therapy

## 2018-06-14 ENCOUNTER — Encounter: Payer: Self-pay | Admitting: Physical Therapy

## 2018-06-14 DIAGNOSIS — M25561 Pain in right knee: Secondary | ICD-10-CM | POA: Diagnosis not present

## 2018-06-14 DIAGNOSIS — M25661 Stiffness of right knee, not elsewhere classified: Secondary | ICD-10-CM

## 2018-06-14 DIAGNOSIS — R2689 Other abnormalities of gait and mobility: Secondary | ICD-10-CM | POA: Diagnosis not present

## 2018-06-14 DIAGNOSIS — R600 Localized edema: Secondary | ICD-10-CM

## 2018-06-14 DIAGNOSIS — M6281 Muscle weakness (generalized): Secondary | ICD-10-CM | POA: Diagnosis not present

## 2018-06-14 NOTE — Therapy (Signed)
[place], [place], [address] Phone: [phone]   Fax:  [phone]  Physical Therapy Treatment  Patient Details  Name: Joshua Gaines MRN: [id number] Date of Birth: 1980/05/23 Referring Provider (PT): Ophelia Charter MD   Encounter Date: 06/14/2018  PT End of Session - 06/14/18 1505    Visit Number  22    Number of Visits  24    Date for PT Re-Evaluation  06/19/18    Authorization Type  [facility] [employer]    PT Start Time  5809   pt arrived 5 min late and had to use the bathroom   PT Stop Time  1545    PT Time Calculation (min)  38 min    Activity Tolerance  Patient tolerated treatment well    Behavior During Therapy  [facility] for tasks assessed/performed       Past Medical History:  Diagnosis Date  . GERD (gastroesophageal reflux disease)     Past Surgical History:  Procedure Laterality Date  . ANTERIOR CRUCIATE LIGAMENT REPAIR  [id number]  . CHONDROPLASTY Right 02/01/2018   Procedure: CHONDROPLASTY;  Surgeon: Hiram Gash, MD;  Location: [place];  Service: Orthopedics;  Laterality: Right;  . HARDWARE REMOVAL Right 02/01/2018   Procedure: HARDWARE REMOVAL;  Surgeon: Hiram Gash, MD;  Location: [place];  Service: Orthopedics;  Laterality: Right;  . HERNIA REPAIR    . KNEE ARTHROSCOPY WITH ANTERIOR CRUCIATE LIGAMENT (ACL) REPAIR WITH HAMSTRING GRAFT Right 02/01/2018   Procedure: RIGHT KNEE ARTHROSCOPY WITH POSSIBLE REVISION ANTERIOR CRUCIATE LIGAMENT (ACL) REPAIR WITH AUTOGRAFT HAMSTRING;  Surgeon: Hiram Gash, MD;  Location: [place];  Service: Orthopedics;  Laterality: Right;  . KNEE ARTHROSCOPY WITH LATERAL MENISECTOMY Right 02/01/2018   Procedure: RIGHT KNEE ARTHROSCOPY WITH LATERAL MENISECTOMY;  Surgeon: Hiram Gash, MD;  Location: [place];  Service: Orthopedics;  Laterality: Right;  . KNEE ARTHROSCOPY WITH MEDIAL MENISECTOMY Right 02/01/2018    Procedure: RIGHT KNEE ARTHROSCOPY WITH MEDIAL MENISECTOMY, CHONDROPLASTY;  Surgeon: Hiram Gash, MD;  Location: [place];  Service: Orthopedics;  Laterality: Right;  . MENISCUS REPAIR     left knee    There were no vitals filed for this visit.  Subjective Assessment - 06/14/18 1506    Subjective  "I've had more stiffess in the knee along the patellar tendon"    Patient Stated Goals  return back to normal as much as possible, playing basketball,     Currently in Pain?  No/denies    Pain Type  Chronic pain    Pain Onset  More than a month ago    Aggravating Factors   N/A    Pain Relieving Factors  exercise          [facility] PT Assessment - 06/14/18 0001      Assessment   Medical Diagnosis  S/P R ACL Reconstruction (hamstring graft)    Onset Date/Surgical Date  02/01/18                   [facility] Adult PT Treatment/Exercise - 06/14/18 0001      Knee/Hip Exercises: Stretches   Active Hamstring Stretch  2 reps;30 seconds    Quad Stretch  2 reps;30 seconds   prone     Knee/Hip Exercises: Machines for Strengthening   Cybex Knee Extension  single leg 2 x 12 25#    Cybex Leg Press  SL 2 x  15 60#, 2 x 15 80#    Hip Cybex  bil hip abduction 2 x 12 50#      Knee/Hip Exercises: Plyometrics   Other Plyometric Exercises  sprinting 50% cutting to the L/R x 6 ea.    Other Plyometric Exercises  sprinting 30 ft 50% to 75% and stopping aburptly at the end      Knee/Hip Exercises: Seated   Other Seated Knee/Hip Exercises  russian eccentric hamstring curls 1 x 20      Manual Therapy   Soft tissue mobilization  IASTM and cross frictoin massage along the patellar tendon   using biofreeze              PT Short Term Goals - 04/10/18 1530      PT SHORT TERM GOAL #1   Title  pt to be I with inital HEp     Time  4    Period  Weeks    Status  Achieved      PT SHORT TERM GOAL #2   Title  pt to verbalize and demo techniques to reduce pain and  inflammation via RICE and HEP    Period  Weeks    Status  Achieved      PT SHORT TERM GOAL #3   Title  pt to demo good quad activation and SLR with </= 10 degree quad lag for functional progression    Time  4    Period  Weeks    Status  Achieved      PT SHORT TERM GOAL #4   Title  pt to increase knee flexion to >/= 90 degrees with </= 4/10 pain for therapuetic progression     Period  Weeks    Status  Achieved        PT Long Term Goals - 04/10/18 1531      PT LONG TERM GOAL #1   Title  increase ROM to </= 2 degrees and >/= 120 degrees with </= 1/10 pain for functional ROM required for efficent gait and ADLs    Baseline  pain at 3-4/10 with deep bending    Time  8    Period  Weeks    Status  On-going    Target Date  06/19/18      PT LONG TERM GOAL #2   Title  pt to demo RLE strength to >/= 4+/5 in all planes to promote knee stability with standing/ walking and dynamic activities    Time  8    Period  Weeks    Status  On-going      PT LONG TERM GOAL #3   Title  pt to be able to perfor dynamic activities and jogging/ hopping with no report of pain or instability for personal goal of returning to playing basketball     Time  8    Period  Weeks    Status  On-going      PT LONG TERM GOAL #4   Title  increase FOTO score to </= 34% limited to demo improvement in function    Time  8    Period  Weeks    Status  On-going      PT LONG TERM GOAL #5   Title  pt to be I with all HEP given as of last visit to maintain and progress current level of function independently.     Period  Weeks    Status  On-going  Plan - 06/14/18 1605    Clinical Impression Statement  pt is a 19 weeks post op today. continued working on hip / knee strengthening with emphsis on single leg work. Continued working on dynamic activities including sprinting and cutting to the L/R. He did all exercises with no reporting no issues or pain.     PT Next Visit Plan  19 weeks post op, update HEP  PRN,stepper vs ellipitcal (no running until 5 months post op),knee/ hip strength, balance plyometrics, cutting    PT Home Exercise Plan  quad set, standing weight shifting for involuntary quad activation, hip abduction, heel raise (in brace), heel slides with strap, SAQ    Consulted and Agree with Plan of Care  Patient       Patient will benefit from skilled therapeutic intervention in order to improve the following deficits and impairments:  Abnormal gait, Pain, Decreased strength, Increased fascial restricitons, Increased edema, Decreased balance, Decreased endurance, Improper body mechanics, Postural dysfunction, Decreased range of motion  Visit Diagnosis: Acute pain of right knee  Stiffness of right knee, not elsewhere classified  Localized edema  Muscle weakness (generalized)  Other abnormalities of gait and mobility     Problem List Patient Active Problem List   Diagnosis Date Noted  . Localized osteoarthritis of left knee 03/02/2018  . Annual physical exam 12/23/2016  . Acute meniscal tear of right knee 02/27/2015  . Right foot pain 01/19/2013   Starr Lake PT, DPT, LAT, ATC  06/14/18  4:09 PM      [facility] [address] [place], [place], [address] Phone: [phone]   Fax:  [phone]  Name: Joshua Gaines MRN: [id number] Date of Birth: 09/04/1980

## 2018-06-21 ENCOUNTER — Encounter: Payer: Self-pay | Admitting: Physical Therapy

## 2018-06-21 ENCOUNTER — Ambulatory Visit: Payer: 59 | Admitting: Physical Therapy

## 2018-06-21 DIAGNOSIS — M25561 Pain in right knee: Secondary | ICD-10-CM | POA: Diagnosis not present

## 2018-06-21 DIAGNOSIS — M25661 Stiffness of right knee, not elsewhere classified: Secondary | ICD-10-CM | POA: Diagnosis not present

## 2018-06-21 DIAGNOSIS — M6281 Muscle weakness (generalized): Secondary | ICD-10-CM | POA: Diagnosis not present

## 2018-06-21 DIAGNOSIS — R2689 Other abnormalities of gait and mobility: Secondary | ICD-10-CM | POA: Diagnosis not present

## 2018-06-21 DIAGNOSIS — R600 Localized edema: Secondary | ICD-10-CM

## 2018-06-21 NOTE — Patient Instructions (Signed)

## 2018-06-21 NOTE — Therapy (Addendum)
[place], [place], [address] Phone: [phone]   Fax:  [phone]  Physical Therapy Treatment / Re-certification  Patient Details  Name: Joshua Gaines MRN: [id number] Date of Birth: [birth year] Referring Provider (PT): Ophelia Charter MD   Encounter Date: 06/21/2018  PT End of Session - 06/21/18 1510    Visit Number  23    Number of Visits  26    Date for PT Re-Evaluation  07/12/18    Authorization Type  [facility]    PT Start Time  1506    PT Stop Time  1548    PT Time Calculation (min)  42 min    Activity Tolerance  Patient tolerated treatment well    Behavior During Therapy  [facility] for tasks assessed/performed       Past Medical History:  Diagnosis Date  . GERD (gastroesophageal reflux disease)     Past Surgical History:  Procedure Laterality Date  . ANTERIOR CRUCIATE LIGAMENT REPAIR  [id number]  . CHONDROPLASTY Right 02/01/2018   Procedure: CHONDROPLASTY;  Surgeon: Hiram Gash, MD;  Location: [place];  Service: Orthopedics;  Laterality: Right;  . HARDWARE REMOVAL Right 02/01/2018   Procedure: HARDWARE REMOVAL;  Surgeon: Hiram Gash, MD;  Location: [place];  Service: Orthopedics;  Laterality: Right;  . HERNIA REPAIR    . KNEE ARTHROSCOPY WITH ANTERIOR CRUCIATE LIGAMENT (ACL) REPAIR WITH HAMSTRING GRAFT Right 02/01/2018   Procedure: RIGHT KNEE ARTHROSCOPY WITH POSSIBLE REVISION ANTERIOR CRUCIATE LIGAMENT (ACL) REPAIR WITH AUTOGRAFT HAMSTRING;  Surgeon: Hiram Gash, MD;  Location: [place];  Service: Orthopedics;  Laterality: Right;  . KNEE ARTHROSCOPY WITH LATERAL MENISECTOMY Right 02/01/2018   Procedure: RIGHT KNEE ARTHROSCOPY WITH LATERAL MENISECTOMY;  Surgeon: Hiram Gash, MD;  Location: [place];  Service: Orthopedics;  Laterality: Right;  . KNEE ARTHROSCOPY WITH MEDIAL MENISECTOMY Right 02/01/2018   Procedure: RIGHT KNEE  ARTHROSCOPY WITH MEDIAL MENISECTOMY, CHONDROPLASTY;  Surgeon: Hiram Gash, MD;  Location: [place];  Service: Orthopedics;  Laterality: Right;  . MENISCUS REPAIR     left knee    There were no vitals filed for this visit.  Subjective Assessment - 06/21/18 1510    Subjective  "I have noticed increasing soreness in the from the knee cap/ and tendon"          [facility] PT Assessment - 06/21/18 0001      Assessment   Medical Diagnosis  S/P R ACL Reconstruction (hamstring graft)    Referring Provider (PT)  Ophelia Charter MD    Onset Date/Surgical Date  02/01/18    Hand Dominance  Right    Next MD Visit  unsure      AROM   Right Knee Extension  1   with end range pain   Right Knee Flexion  123      Strength   Right Knee Flexion  5/5    Right Knee Extension  5/5   soreness noted along the patellar tendon during testin in ex     Palpation   Palpation comment  TTP along the lateral apsect of the patellar tendon                   [facility] Adult PT Treatment/Exercise - 06/21/18 0001      Knee/Hip Exercises: Stretches   Active Hamstring Stretch  3 reps;30 seconds    Quad Stretch  3 reps;30 seconds  Knee/Hip Exercises: Aerobic   Tread Mill  4.0 MHP x 3 min      Modalities   Modalities  Iontophoresis      Iontophoresis   Type of Iontophoresis  Dexamethasone    Location  R lateral patellar tendon    Dose  16m/ml    Time  6 hour patch      Manual Therapy   Manual therapy comments  skilled palpation and monitoring of pt with TPDN    Soft tissue mobilization  IASTM and cross frictoin massage along the patellar tendon and distal quad       Trigger Point Dry Needling - 06/21/18 1539    Consent Given?  Yes    Education Handout Provided  Yes    Muscles Treated Lower Body  Quadriceps   focusing on distal vastus lateralis and rectus femoris          PT Education - 06/21/18 1746    Education Details  muscle anatomy and referral patterns. What  TPDN is, what to expect and benefits. To resume knee eccentric strengthening to promote remodeling of the patellar tendon    Person(s) Educated  Patient    Methods  Explanation;Verbal cues;Handout    Comprehension  Verbalized understanding;Verbal cues required       PT Short Term Goals - 04/10/18 1530      PT SHORT TERM GOAL #1   Title  pt to be I with inital HEp     Time  4    Period  Weeks    Status  Achieved      PT SHORT TERM GOAL #2   Title  pt to verbalize and demo techniques to reduce pain and inflammation via RICE and HEP    Period  Weeks    Status  Achieved      PT SHORT TERM GOAL #3   Title  pt to demo good quad activation and SLR with </= 10 degree quad lag for functional progression    Time  4    Period  Weeks    Status  Achieved      PT SHORT TERM GOAL #4   Title  pt to increase knee flexion to >/= 90 degrees with </= 4/10 pain for therapuetic progression     Period  Weeks    Status  Achieved        PT Long Term Goals - 06/21/18 1753      PT LONG TERM GOAL #1   Title  increase ROM to </= 2 degrees and >/= 120 degrees with </= 1/10 pain for functional ROM required for efficent gait and ADLs    Period  Weeks    Status  Achieved      PT LONG TERM GOAL #2   Title  pt to demo RLE strength to >/= 4+/5 in all planes to promote knee stability with standing/ walking and dynamic activities    Time  8    Period  Weeks    Status  Achieved      PT LONG TERM GOAL #3   Title  pt to be able to perfor dynamic activities and jogging/ hopping with no report of pain or instability for personal goal of returning to playing basketball     Time  8    Period  Weeks    Status  Partially Met      PT LONG TERM GOAL #4   Title  increase FOTO score to </= 34% limited to  demo improvement in function    Time  8    Period  Weeks    Status  Unable to assess      PT LONG TERM GOAL #5   Title  pt to be I with all HEP given as of last visit to maintain and progress current level  of function independently.     Time  8    Period  Weeks    Status  On-going            Plan - 06/21/18 1749    Clinical Impression Statement  pt is 20 weeks post op today. He continues to make good progress with knee ROM and strengthening, He does note increased soreness ince the last visit located along the lateral aspect of the patellar tendon. Educated and consent was given for DN along the distal rectus femoris and vastus lateralis follwed with IASTM techniques. applied ionto patch to the patellar tendon to calm down inflammation. plan to see pt back for 1 x a week for the next 3 weeks to work on reducing pain, resume dynamic activity and work toward remaining goals and independent exercise.     PT Frequency  1x / week    PT Duration  3 weeks    PT Treatment/Interventions  ADLs/Self Care Home Management;Cryotherapy;Iontophoresis 65m/ml Dexamethasone;Electrical Stimulation;Moist Heat;Ultrasound;Balance training;Neuromuscular re-education;Therapeutic activities;Therapeutic exercise;Manual techniques;Vasopneumatic Device;Gait training;Patient/family education;Taping;Dry needling;Scar mobilization    PT Next Visit Plan  20 weeks post op 06/21/2018, update HEP PRN,stepper vs ellipitcal ,knee/ hip strength, balance plyometrics, cutting, how was DN and ionto    PT Home Exercise Plan  quad set, standing weight shifting for involuntary quad activation, hip abduction, heel raise (in brace), heel slides with strap, SAQ    Consulted and Agree with Plan of Care  Patient       Patient will benefit from skilled therapeutic intervention in order to improve the following deficits and impairments:  Abnormal gait, Pain, Decreased strength, Increased fascial restricitons, Increased edema, Decreased balance, Decreased endurance, Improper body mechanics, Postural dysfunction, Decreased range of motion  Visit Diagnosis: Acute pain of right knee  Stiffness of right knee, not elsewhere classified  Localized  edema  Muscle weakness (generalized)  Other abnormalities of gait and mobility     Problem List Patient Active Problem List   Diagnosis Date Noted  . Localized osteoarthritis of left knee 03/02/2018  . Annual physical exam 12/23/2016  . Acute meniscal tear of right knee 02/27/2015  . Right foot pain 01/19/2013   KStarr LakePT, DPT, LAT, ATC  06/21/18  5:56 PM      [facility]GLe[place] [address]Phone: [phone]  Fax:  [phone] Name: Joshua GLASHEENMRN: [id number] of Birth: 9Sep 19, 1982

## 2018-06-22 NOTE — Addendum Note (Signed)
Addended by: Larey Days on: 06/22/2018 12:15 PM   Modules accepted: Orders

## 2018-07-05 ENCOUNTER — Encounter: Payer: Self-pay | Admitting: Physical Therapy

## 2018-07-05 ENCOUNTER — Ambulatory Visit: Payer: 59 | Attending: Orthopaedic Surgery | Admitting: Physical Therapy

## 2018-07-05 DIAGNOSIS — R600 Localized edema: Secondary | ICD-10-CM | POA: Diagnosis not present

## 2018-07-05 DIAGNOSIS — M25561 Pain in right knee: Secondary | ICD-10-CM | POA: Diagnosis not present

## 2018-07-05 DIAGNOSIS — M6281 Muscle weakness (generalized): Secondary | ICD-10-CM | POA: Insufficient documentation

## 2018-07-05 DIAGNOSIS — M25661 Stiffness of right knee, not elsewhere classified: Secondary | ICD-10-CM | POA: Insufficient documentation

## 2018-07-05 DIAGNOSIS — R2689 Other abnormalities of gait and mobility: Secondary | ICD-10-CM | POA: Diagnosis not present

## 2018-07-05 NOTE — Therapy (Signed)
[place], [place], [address] Phone: [phone]   Fax:  [phone]  Physical Therapy Treatment / Discharge Summary  Patient Details  Name: Joshua Gaines MRN: [id number] Date of Birth: 08-Jul-1980 Referring Provider (PT): Ophelia Charter MD   Encounter Date: 07/05/2018  PT End of Session - 07/05/18 1544    Visit Number  24    Number of Visits  26    Date for PT Re-Evaluation  07/12/18    Authorization Type  [facility] UMR    PT Start Time  1500    PT Stop Time  1532    PT Time Calculation (min)  32 min    Activity Tolerance  Patient tolerated treatment well    Behavior During Therapy  [facility] for tasks assessed/performed       Past Medical History:  Diagnosis Date  . GERD (gastroesophageal reflux disease)     Past Surgical History:  Procedure Laterality Date  . ANTERIOR CRUCIATE LIGAMENT REPAIR  [id number]  . CHONDROPLASTY Right 02/01/2018   Procedure: CHONDROPLASTY;  Surgeon: Hiram Gash, MD;  Location: [place];  Service: Orthopedics;  Laterality: Right;  . HARDWARE REMOVAL Right 02/01/2018   Procedure: HARDWARE REMOVAL;  Surgeon: Hiram Gash, MD;  Location: [place];  Service: Orthopedics;  Laterality: Right;  . HERNIA REPAIR    . KNEE ARTHROSCOPY WITH ANTERIOR CRUCIATE LIGAMENT (ACL) REPAIR WITH HAMSTRING GRAFT Right 02/01/2018   Procedure: RIGHT KNEE ARTHROSCOPY WITH POSSIBLE REVISION ANTERIOR CRUCIATE LIGAMENT (ACL) REPAIR WITH AUTOGRAFT HAMSTRING;  Surgeon: Hiram Gash, MD;  Location: [place];  Service: Orthopedics;  Laterality: Right;  . KNEE ARTHROSCOPY WITH LATERAL MENISECTOMY Right 02/01/2018   Procedure: RIGHT KNEE ARTHROSCOPY WITH LATERAL MENISECTOMY;  Surgeon: Hiram Gash, MD;  Location: [place];  Service: Orthopedics;  Laterality: Right;  . KNEE ARTHROSCOPY WITH MEDIAL MENISECTOMY Right 02/01/2018   Procedure: RIGHT KNEE  ARTHROSCOPY WITH MEDIAL MENISECTOMY, CHONDROPLASTY;  Surgeon: Hiram Gash, MD;  Location: [place];  Service: Orthopedics;  Laterality: Right;  . MENISCUS REPAIR     left knee    There were no vitals filed for this visit.  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  Yes    Pain Score  1     Pain Location  Knee    Pain Orientation  Right    Pain Descriptors / Indicators  Sore    Pain Type  Chronic pain    Pain Onset  More than a month ago    Pain Frequency  Intermittent    Aggravating Factors   N/A    Pain Relieving Factors  exercise         [facility] PT Assessment - 07/05/18 0001      Assessment   Medical Diagnosis  S/P R ACL Reconstruction (hamstring graft)    Referring Provider (PT)  Ophelia Charter MD    Onset Date/Surgical Date  02/01/18    Hand Dominance  Right      Observation/Other Assessments   Focus on Therapeutic Outcomes (FOTO)   1% limited      AROM   Right Knee Extension  1    Right Knee Flexion  124  [place] Adult PT Treatment/Exercise - 07/05/18 0001      Knee/Hip Exercises: Aerobic   Stepper  L4 x 3 min      Knee/Hip Exercises: Standing   Step Down  Right;2 sets;10 reps;Step Height: 4"   combined with TKE    Other Standing Knee Exercises  TKE pushing knee into ball against the wall 2 x 10 holidng 3 sec ea.    Other Standing Knee Exercises  forward/ retro walking with anterior pull of band x 10 focus on terminal knee extension       Manual Therapy   Other Manual Therapy  trial mack shift cho-pat strap using pre-wrap             PT Education - 07/05/18 1643    Education Details  reviewed previuosly provided HEP. benefits of quad activation focusing TKE and provided additonal handout (pt reported he didin't require handout)    Person(s) Educated   Patient    Methods  Explanation;Verbal cues;Demonstration    Comprehension  Verbalized understanding;Verbal cues required;Returned demonstration       PT Short Term Goals - 04/10/18 1530      PT SHORT TERM GOAL #1   Title  pt to be I with inital HEp     Time  4    Period  Weeks    Status  Achieved      PT SHORT TERM GOAL #2   Title  pt to verbalize and demo techniques to reduce pain and inflammation via RICE and HEP    Period  Weeks    Status  Achieved      PT SHORT TERM GOAL #3   Title  pt to demo good quad activation and SLR with </= 10 degree quad lag for functional progression    Time  4    Period  Weeks    Status  Achieved      PT SHORT TERM GOAL #4   Title  pt to increase knee flexion to >/= 90 degrees with </= 4/10 pain for therapuetic progression     Period  Weeks    Status  Achieved        PT Long Term Goals - 07/05/18 1641      PT LONG TERM GOAL #1   Title  increase ROM to </= 2 degrees and >/= 120 degrees with </= 1/10 pain for functional ROM required for efficent gait and ADLs    Time  8    Period  Weeks    Status  Achieved      PT LONG TERM GOAL #2   Title  pt to demo RLE strength to >/= 4+/5 in all planes to promote knee stability with standing/ walking and dynamic activities    Time  8    Status  Achieved      PT LONG TERM GOAL #3   Title  pt to be able to perfor dynamic activities and jogging/ hopping with no report of pain or instability for personal goal of returning to playing basketball     Time  8    Period  Weeks    Status  Achieved      PT LONG TERM GOAL #4   Title  increase FOTO score to </= 34% limited to demo improvement in function    Period  Weeks    Status  Achieved      PT LONG TERM GOAL #5   Title  pt to be I with all  HEP given as of last visit to maintain and progress current level of function independently.     Time  8    Period  Weeks            Plan - 07/05/18 1606    Clinical Impression Statement  Joshua Gaines has made  great progress with physical therapy increased knee ROM and strength and additionaly reports only minimal soreness along the distal patellar tendon. focused on TKE and quad activation which he noted relief of soreness. he reports he has been able to play basketball focusing on good form/mechanics and has noticed no soreness/ pain. pt met all goals and is able to maintain his current level of function and will be discharged today.     PT Treatment/Interventions  ADLs/Self Care Home Management;Cryotherapy;Iontophoresis 12m/ml Dexamethasone;Electrical Stimulation;Moist Heat;Ultrasound;Balance training;Neuromuscular re-education;Therapeutic activities;Therapeutic exercise;Manual techniques;Vasopneumatic Device;Gait training;Patient/family education;Taping;Dry needling;Scar mobilization    PT Next Visit Plan  D/C    PT Home Exercise Plan  quad set, standing weight shifting for involuntary quad activation, hip abduction, heel raise (in brace), heel slides with strap, SAQ    Consulted and Agree with Plan of Care  Patient       Patient will benefit from skilled therapeutic intervention in order to improve the following deficits and impairments:  Abnormal gait, Pain, Decreased strength, Increased fascial restricitons, Increased edema, Decreased balance, Decreased endurance, Improper body mechanics, Postural dysfunction, Decreased range of motion  Visit Diagnosis: Acute pain of right knee  Stiffness of right knee, not elsewhere classified  Localized edema  Muscle weakness (generalized)  Other abnormalities of gait and mobility     Problem List Patient Active Problem List   Diagnosis Date Noted  . Localized osteoarthritis of left knee 03/02/2018  . Annual physical exam 12/23/2016  . Acute meniscal tear of right knee 02/27/2015  . Right foot pain 01/19/2013    LStarr Lake3/08/2018, 4:45 PM  [facility]  [place] [address]Phone: [phone]  Fax:  [phone] Name: Joshua KLAUSMRN: [id number] of Birth: [birth year]  PHYSICAL THERAPY DISCHARGE SUMMARY  Visits from Start of Care: 24  Current functional level related to goals / functional outcomes: See goals FOTO 1% limited   Remaining deficits: Mild soreness noted along distal patellar tendon    Education / Equipment: HEP, theraband, posture. Running/ jumping mechanics.  Plan: Patient agrees to discharge.  Patient goals were met. Patient is being discharged due to being pleased with the current functional level.  ?????      PT, DPT, LAT, ATC  07/05/18  4:45 PM

## 2018-07-12 ENCOUNTER — Encounter: Payer: 59 | Admitting: Physical Therapy

## 2018-07-19 ENCOUNTER — Encounter: Payer: 59 | Admitting: Physical Therapy

## 2018-08-21 ENCOUNTER — Ambulatory Visit: Payer: 59 | Admitting: Sports Medicine

## 2018-08-22 ENCOUNTER — Ambulatory Visit (INDEPENDENT_AMBULATORY_CARE_PROVIDER_SITE_OTHER): Payer: 59 | Admitting: Sports Medicine

## 2018-08-22 ENCOUNTER — Encounter: Payer: Self-pay | Admitting: Sports Medicine

## 2018-08-22 DIAGNOSIS — I861 Scrotal varices: Secondary | ICD-10-CM | POA: Insufficient documentation

## 2018-08-22 DIAGNOSIS — R229 Localized swelling, mass and lump, unspecified: Secondary | ICD-10-CM

## 2018-08-22 MED ORDER — DOXYCYCLINE HYCLATE 100 MG PO TABS: 100.0000 mg | ORAL_TABLET | Freq: Two times a day (BID) | ORAL | 0 refills | Status: AC

## 2018-08-22 MED FILL — DOXYCYCLINE HYCLATE 100 MG: 100 | 7 days supply | Qty: 14 | Fill #0

## 2018-08-22 NOTE — Assessment & Plan Note (Signed)
Mild symptoms, adding a scrotal ultrasound, treatment can include avoiding straining, analgesics, as well as scrotal support dressings. This is benign, and no aggressive treatment is really needed.

## 2018-08-22 NOTE — Progress Notes (Signed)
Subjective:    CC: Right pelvic mass, left testicular mass  HPI: Right pelvic mass: Present for a month now, nontender, feels a little bit smaller than it was prior.  No drainage, no surrounding warmth or erythema.  Mild, improving.  Left testicular pain: He did notice a mass approximately 3 months ago, wife is a Designer, jewellery, suspected epididymitis.  It is minimally tender, mostly on the left side and worse with standing for long periods of time, straining.  No dysuria, no discharge, no penile lesions, no other pelvic lesions, symptoms are fairly stable.  I reviewed the past medical history, family history, social history, surgical history, and allergies today and no changes were needed.  Please see the problem list section below in epic for further details.  Past Medical History: Past Medical History:  Diagnosis Date  . GERD (gastroesophageal reflux disease)    Past Surgical History: Past Surgical History:  Procedure Laterality Date  . ANTERIOR CRUCIATE LIGAMENT REPAIR  [id number]  . CHONDROPLASTY Right 02/01/2018   Procedure: CHONDROPLASTY;  Surgeon: Hiram Gash, MD;  Location: [place];  Service: Orthopedics;  Laterality: Right;  . HARDWARE REMOVAL Right 02/01/2018   Procedure: HARDWARE REMOVAL;  Surgeon: Hiram Gash, MD;  Location: [place];  Service: Orthopedics;  Laterality: Right;  . HERNIA REPAIR    . KNEE ARTHROSCOPY WITH ANTERIOR CRUCIATE LIGAMENT (ACL) REPAIR WITH HAMSTRING GRAFT Right 02/01/2018   Procedure: RIGHT KNEE ARTHROSCOPY WITH POSSIBLE REVISION ANTERIOR CRUCIATE LIGAMENT (ACL) REPAIR WITH AUTOGRAFT HAMSTRING;  Surgeon: Hiram Gash, MD;  Location: [place];  Service: Orthopedics;  Laterality: Right;  . KNEE ARTHROSCOPY WITH LATERAL MENISECTOMY Right 02/01/2018   Procedure: RIGHT KNEE ARTHROSCOPY WITH LATERAL MENISECTOMY;  Surgeon: Hiram Gash, MD;  Location: [place];  Service: Orthopedics;   Laterality: Right;  . KNEE ARTHROSCOPY WITH MEDIAL MENISECTOMY Right 02/01/2018   Procedure: RIGHT KNEE ARTHROSCOPY WITH MEDIAL MENISECTOMY, CHONDROPLASTY;  Surgeon: Hiram Gash, MD;  Location: [place];  Service: Orthopedics;  Laterality: Right;  . MENISCUS REPAIR     left knee   Social History: Social History   Socioeconomic History  . Marital status: Married    Spouse name: Not on file  . Number of children: Not on file  . Years of education: Not on file  . Highest education level: Not on file  Occupational History  . Not on file  Social Needs  . Financial resource strain: Not on file  . Food insecurity:    Worry: Not on file    Inability: Not on file  . Transportation needs:    Medical: Not on file    Non-medical: Not on file  Tobacco Use  . Smoking status: Never Smoker  . Smokeless tobacco: Never Used  Substance and Sexual Activity  . Alcohol use: No  . Drug use: No  . Sexual activity: Yes    Partners: Female  Lifestyle  . Physical activity:    Days per week: Not on file    Minutes per session: Not on file  . Stress: Not on file  Relationships  . Social connections:    Talks on phone: Not on file    Gets together: Not on file    Attends religious service: Not on file    Active member of club or organization: Not on file    Attends meetings of clubs or organizations: Not on file    Relationship status: Not on  file  Other Topics Concern  . Not on file  Social History Narrative  . Not on file   Family History: No family history on file. Allergies: No Known Allergies Medications: See med rec.  Review of Systems: No fevers, chills, night sweats, weight loss, chest pain, or shortness of breath.   Objective:    General: Well Developed, well nourished, and in no acute distress.  Neuro: Alert and oriented x3, extra-ocular muscles intact, sensation grossly intact.  HEENT: Normocephalic, atraumatic, pupils equal round reactive to light, neck  supple, no masses, no lymphadenopathy, thyroid nonpalpable.  Skin: Warm and dry, no rashes. Cardiac: Regular rate and rhythm, no murmurs rubs or gallops, no lower extremity edema.  Respiratory: Clear to auscultation bilaterally. Not using accessory muscles, speaking in full sentences. Genital exam: There is a well-defined, subcutaneous 1 cm mass in the right anterior pelvis just above the scrotum, feels like a sebaceous cyst.  In addition he has a palpable "bag of worms" in the left hemiscrotum, consistent with varicocele.  No other palpable testicular masses, no penile lesions, no discharge.  Impression and Recommendations:    Right Pelvic Subcutaneous Nodule Feels like a sebaceous cyst. Adding a course of doxycycline, if still present in 1 month we will do an excision in the office.   This will need to be a 30-minute slot.  Left varicocele Mild symptoms, adding a scrotal ultrasound, treatment can include avoiding straining, analgesics, as well as scrotal support dressings. This is benign, and no aggressive treatment is really needed.   ___________________________________________ Gwen Her. Dianah Field, M.D., ABFM., CAQSM. Primary Care and Sports Medicine [facility]  Adjunct Professor of [place] [employer] [facility] [employer] [facility] [employer]

## 2018-08-22 NOTE — Assessment & Plan Note (Signed)
Feels like a sebaceous cyst. Adding a course of doxycycline, if still present in 1 month we will do an excision in the office.   This will need to be a 30-minute slot.

## 2018-08-22 NOTE — Patient Instructions (Signed)
Varicocele    A varicocele is a swelling of veins in the scrotum. The scrotum is the sac that contains the testicles. Varicoceles can occur on either side of the scrotum, but they are more common on the left side. They occur most often in teenage boys and young men.  In most cases, varicoceles are not a serious problem. They are usually small and painless and do not require treatment. Tests may be done to confirm the diagnosis. Treatment may be needed if:  · A varicocele is large, causes a lot of pain, or causes pain when exercising.  · Varicoceles are found on both sides of the scrotum.  · A varicocele causes a decrease in the size of the testicle in a growing adolescent.  · The person has fertility problems.  What are the causes?  This condition is the result of valves in the veins not working properly. Valves in the veins help to return blood from the scrotum and testicles to the heart. If these valves do not work well, blood flows backward and backs up into the veins, which causes the veins to swell. This is similar to what happens when varicose veins form in the leg.  What are the signs or symptoms?  Most varicoceles do not cause any symptoms. If symptoms do occur, they may include:  · Swelling on one side of the scrotum. The swelling may be more obvious when you are standing up.  · A lumpy feeling in the scrotum.  · A heavy feeling on one side of the scrotum.  · A dull ache in the scrotum, especially after exercise or prolonged standing or sitting.  · Slower growth or reduced size of the testicle on the side of the varicocele (in young males).  · Problems with fertility. This can occur if the testicle does not grow normally.  How is this diagnosed?  This condition may be diagnosed with a physical exam. You may also have an imaging test called an ultrasound to confirm the diagnosis and to help rule out other causes of the swelling.  How is this treated?  Treatment is usually not needed for this condition. If  you have any pain, your health care provider may prescribe or recommend medicine to help relieve it. You may need regular exams so your health care provider can monitor the varicocele to ensure that it does not cause problems. When further treatment is needed, it may involve one of these options:  · Varicocelectomy. This is a surgery in which the swollen veins are tied off so that the flow of blood goes to other veins instead.  · Embolization. In this procedure, a small tube (catheter) is used to place metal coils or other blocking items in the veins. This cuts off the blood flow to the swollen veins.  Follow these instructions at home:  · Take over-the-counter and prescription medicines only as told by your health care provider.  · Wear supportive underwear.  · Use an athletic supporter when participating in sports activities.  · Keep all follow-up visits as told by your health care provider. This is important.  Contact a health care provider if:  · Your pain is increasing.  · You have redness in the affected area.  · Your testicle becomes enlarged, swollen, or painful.  · You have swelling that does not decrease when you are lying down.  · One of your testicles is smaller than the other.  Summary  · Varicocele is a condition in   which the veins in the scrotum are swollen or enlarged.  · In most cases, varicoceles do not require treatment.  · Treatment may be needed if you have pain, have problems with infertility, or have a smaller testicle associated with the varicocele.  · In some cases, the condition may be treated with a procedure to cut off the flow of blood to the swollen veins.  This information is not intended to replace advice given to you by your health care provider. Make sure you discuss any questions you have with your health care provider.  Document Released: 07/26/2000 Document Revised: 02/02/2017 Document Reviewed: 02/02/2017  Elsevier Interactive Patient Education © 2019 Elsevier Inc.

## 2018-08-23 ENCOUNTER — Ambulatory Visit (INDEPENDENT_AMBULATORY_CARE_PROVIDER_SITE_OTHER): Payer: 59

## 2018-08-23 ENCOUNTER — Other Ambulatory Visit: Payer: Self-pay

## 2018-08-23 DIAGNOSIS — I861 Scrotal varices: Secondary | ICD-10-CM

## 2018-08-23 DIAGNOSIS — N492 Inflammatory disorders of scrotum: Secondary | ICD-10-CM | POA: Diagnosis not present

## 2018-08-25 ENCOUNTER — Ambulatory Visit: Payer: 59 | Admitting: Sports Medicine

## 2019-05-21 ENCOUNTER — Ambulatory Visit (INDEPENDENT_AMBULATORY_CARE_PROVIDER_SITE_OTHER): Payer: 59

## 2019-05-21 ENCOUNTER — Ambulatory Visit (INDEPENDENT_AMBULATORY_CARE_PROVIDER_SITE_OTHER): Payer: 59 | Admitting: Sports Medicine

## 2019-05-21 ENCOUNTER — Other Ambulatory Visit: Payer: Self-pay

## 2019-05-21 DIAGNOSIS — M1712 Unilateral primary osteoarthritis, left knee: Secondary | ICD-10-CM | POA: Diagnosis not present

## 2019-05-21 DIAGNOSIS — M25461 Effusion, right knee: Secondary | ICD-10-CM | POA: Diagnosis not present

## 2019-05-21 DIAGNOSIS — M25462 Effusion, left knee: Secondary | ICD-10-CM | POA: Diagnosis not present

## 2019-05-21 NOTE — Progress Notes (Signed)
    Procedures performed today:    Procedure: Real-time Ultrasound Guided injection of the left knee Device: Samsung HS60  Verbal informed consent obtained.  Time-out conducted.  Noted no overlying erythema, induration, or other signs of local infection.  Skin prepped in a sterile fashion.  Local anesthesia: Topical Ethyl chloride.  With sterile technique and under real time ultrasound guidance:  3 cc lidocaine, 3 cc bupivacaine injected easily, I then manipulated the knee and was able to unlock it.  Joshua Gaines had about 10 degrees of extension lag prior, and full range of motion afterwards. Completed without difficulty  Pain immediately resolved suggesting accurate placement of the medication.  Advised to call if fevers/chills, erythema, induration, drainage, or persistent bleeding.  Images permanently stored and available for review in the ultrasound unit.  Impression: Technically successful ultrasound guided injection.  Independent interpretation of tests performed by another provider:   None.  Impression and Recommendations:    Localized osteoarthritis of left knee Joshua Gaines returns, he has a history of a bucket-handle meniscal tear post resection sometime ago. About a year and a half ago he locked in extension which we were able to manipulate. He had another locking episode, I placed some lidocaine in his knee and was able to unlock it. Ultimately we do need to proceed with x-rays and an MRI, he is going to go ahead and make an appointment with Dr. Griffin Basil who has previously operated on his locked right knee in the past.    ___________________________________________ Gwen Her. Dianah Field, M.D., ABFM., CAQSM. Primary Care and [place] Instructor of [place] [employer] [facility] [employer] Medicine

## 2019-05-21 NOTE — Assessment & Plan Note (Signed)
Joshua Gaines returns, he has a history of a bucket-handle meniscal tear post resection sometime ago. About a year and a half ago he locked in extension which we were able to manipulate. He had another locking episode, I placed some lidocaine in his knee and was able to unlock it. Ultimately we do need to proceed with x-rays and an MRI, he is going to go ahead and make an appointment with Dr. Griffin Basil who has previously operated on his locked right knee in the past.

## 2019-05-26 ENCOUNTER — Ambulatory Visit (INDEPENDENT_AMBULATORY_CARE_PROVIDER_SITE_OTHER): Payer: 59

## 2019-05-26 ENCOUNTER — Other Ambulatory Visit: Payer: Self-pay

## 2019-05-26 DIAGNOSIS — M1712 Unilateral primary osteoarthritis, left knee: Secondary | ICD-10-CM

## 2019-05-26 DIAGNOSIS — R600 Localized edema: Secondary | ICD-10-CM | POA: Diagnosis not present

## 2019-05-26 DIAGNOSIS — M23322 Other meniscus derangements, posterior horn of medial meniscus, left knee: Secondary | ICD-10-CM | POA: Diagnosis not present

## 2019-05-26 DIAGNOSIS — M948X6 Other specified disorders of cartilage, lower leg: Secondary | ICD-10-CM | POA: Diagnosis not present

## 2019-05-29 DIAGNOSIS — M2341 Loose body in knee, right knee: Secondary | ICD-10-CM | POA: Diagnosis not present

## 2019-06-01 ENCOUNTER — Encounter (HOSPITAL_BASED_OUTPATIENT_CLINIC_OR_DEPARTMENT_OTHER): Payer: Self-pay | Admitting: Orthopaedic Surgery

## 2019-06-01 ENCOUNTER — Other Ambulatory Visit: Payer: Self-pay

## 2019-06-04 ENCOUNTER — Telehealth: Payer: Self-pay | Admitting: Physical Therapy

## 2019-06-04 NOTE — Telephone Encounter (Signed)
Returned pt's call answering question regarding a E-stim tool to promote quad strengthening before coming to physical therapy following his upcoming ACL reconstruction to assist with getting the quad activated. I stated as long as he isn't violating restrictions and staying within the protocols provided by the MD.   Starr Lake PT, DPT, LAT, ATC  06/04/19  4:14 PM

## 2019-06-07 ENCOUNTER — Other Ambulatory Visit (HOSPITAL_COMMUNITY)
Admission: RE | Admit: 2019-06-07 | Discharge: 2019-06-07 | Disposition: A | Discharge status: Home / Self Care | Payer: 59 | Source: Ambulatory Visit | Attending: Orthopaedic Surgery | Admitting: Orthopaedic Surgery

## 2019-06-07 DIAGNOSIS — Z20822 Contact with and (suspected) exposure to covid-19: Secondary | ICD-10-CM | POA: Diagnosis not present

## 2019-06-07 DIAGNOSIS — Z01812 Encounter for preprocedural laboratory examination: Secondary | ICD-10-CM | POA: Diagnosis present

## 2019-06-07 LAB — SARS CORONAVIRUS 2 (TAT 6-24 HRS): SARS Coronavirus 2: NEGATIVE

## 2019-06-07 NOTE — H&P (Signed)
PREOPERATIVE H&P  Chief Complaint: CHONDROMALACIA PATELLAE LEFT KNEE,SPRAIN OF LIGAMENT  HPI: Joshua Gaines is a 39 y.o. male who is scheduled for left KNEE ARTHROSCOPY WITH ANTERIOR CRUCIATE LIGAMENT (ACL) REPAIR,  HARDWARE REMOVAL, CHONDROPLASTY.   Patient has a past medical history significant for PONV and GERD.   Patient has a history of ACL reconstruction years ago and meniscectomy for a bucket handle meniscus tear in 2012. He has continued to have symptoms in both knees. He underwent a right arthroscopic ACL reconstruction revision with hamstring autograft by Dr. Griffin Basil in October 2019. He did well after this surgery. Regarding his left knee, he continues to have rotational instability and functional instability with the left knee. He is unhappy with the current function. He is unable to play sports as he wants.   His symptoms are rated as moderate to severe, and have been worsening.  This is significantly impairing activities of daily living.    Please see clinic note for further details on this patient's care.    He has elected for surgical management.   Past Medical History:  Diagnosis Date  . GERD (gastroesophageal reflux disease)   . PONV (postoperative nausea and vomiting)    Past Surgical History:  Procedure Laterality Date  . ANTERIOR CRUCIATE LIGAMENT REPAIR  [id number]  . CHONDROPLASTY Right 02/01/2018   Procedure: CHONDROPLASTY;  Surgeon: Hiram Gash, MD;  Location: [place];  Service: Orthopedics;  Laterality: Right;  . HARDWARE REMOVAL Right 02/01/2018   Procedure: HARDWARE REMOVAL;  Surgeon: Hiram Gash, MD;  Location: [place];  Service: Orthopedics;  Laterality: Right;  . HERNIA REPAIR  aprox 2010 or 2013   right inguinal   . KNEE ARTHROSCOPY WITH ANTERIOR CRUCIATE LIGAMENT (ACL) REPAIR WITH HAMSTRING GRAFT Right 02/01/2018   Procedure: RIGHT KNEE ARTHROSCOPY WITH POSSIBLE REVISION ANTERIOR CRUCIATE LIGAMENT (ACL) REPAIR  WITH AUTOGRAFT HAMSTRING;  Surgeon: Hiram Gash, MD;  Location: [place];  Service: Orthopedics;  Laterality: Right;  . KNEE ARTHROSCOPY WITH LATERAL MENISECTOMY Right 02/01/2018   Procedure: RIGHT KNEE ARTHROSCOPY WITH LATERAL MENISECTOMY;  Surgeon: Hiram Gash, MD;  Location: [place];  Service: Orthopedics;  Laterality: Right;  . KNEE ARTHROSCOPY WITH MEDIAL MENISECTOMY Right 02/01/2018   Procedure: RIGHT KNEE ARTHROSCOPY WITH MEDIAL MENISECTOMY, CHONDROPLASTY;  Surgeon: Hiram Gash, MD;  Location: [place];  Service: Orthopedics;  Laterality: Right;  . MENISCUS REPAIR     left knee   Social History   Socioeconomic History  . Marital status: Married    Spouse name: Not on file  . Number of children: Not on file  . Years of education: Not on file  . Highest education level: Not on file  Occupational History  . Not on file  Tobacco Use  . Smoking status: Never Smoker  . Smokeless tobacco: Never Used  Substance and Sexual Activity  . Alcohol use: No  . Drug use: No  . Sexual activity: Yes    Partners: Female  Other Topics Concern  . Not on file  Social History Narrative  . Not on file   Social Determinants of Health   Financial Resource Strain:   . Difficulty of Paying Living Expenses: Not on file  Food Insecurity:   . Worried About Charity fundraiser in the Last Year: Not on file  . Ran Out of Food in the Last Year: Not on file  Transportation Needs:   .  Lack of Transportation (Medical): Not on file  . Lack of Transportation (Non-Medical): Not on file  Physical Activity:   . Days of Exercise per Week: Not on file  . Minutes of Exercise per Session: Not on file  Stress:   . Feeling of Stress : Not on file  Social Connections:   . Frequency of Communication with Friends and Family: Not on file  . Frequency of Social Gatherings with Friends and Family: Not on file  . Attends Religious Services: Not on file  .  Active Member of Clubs or Organizations: Not on file  . Attends Archivist Meetings: Not on file  . Marital Status: Not on file   No family history on file. No Known Allergies Prior to Admission medications   Medication Sig Start Date End Date Taking? Authorizing Provider  Protein POWD Take by mouth.   Yes [provider]  Multiple Vitamin (MULTIVITAMIN) tablet Take 1 tablet by mouth daily.    [provider]    ROS: All other systems have been reviewed and were otherwise negative with the exception of those mentioned in the HPI and as above.  Physical Exam: General: Alert, no acute distress Cardiovascular: No pedal edema Respiratory: No cyanosis, no use of accessory musculature GI: No organomegaly, abdomen is soft and non-tender Skin: No lesions in the area of chief complaint Neurologic: Sensation intact distally Psychiatric: Patient is competent for consent with normal mood and affect Lymphatic: No axillary or cervical lymphadenopathy  MUSCULOSKELETAL:  Left knee: range of motion of 0-130 degrees. He has gross instability on [place]  as compared to the contralateral side.   He has a pivot glide. Mild soreness with palpation over the lateral aspect of the joint.   Imaging: MRI and X-rays were reviewed in [place] which demonstrate tri-compartmental arthrosis. However, the graft seems to be somewhat intact ,but there is degeneration of the graft .   Assessment: Mild arthritis and functional instability after ACL reconstruction.   Plan: Plan for Procedure(s): KNEE ARTHROSCOPY WITH ANTERIOR CRUCIATE LIGAMENT (ACL) REPAIR HARDWARE REMOVAL CHONDROPLASTY  The patient had very similar findings on his contralateral side and did well with surgery. Dr. Griffin Basil and the patient talked about the risks, benefits and alternatives with hardware removal and autograft hamstring reconstruction with allograft back up. He understands and has elected to proceed.   The  risks benefits and alternatives were discussed with the patient including but not limited to the risks of nonoperative treatment, versus surgical intervention including infection, bleeding, nerve injury,  blood clots, cardiopulmonary complications, morbidity, mortality, among others, and they were willing to proceed.   The patient acknowledged the explanation, agreed to proceed with the plan and consent was signed.   Operative Plan: left knee scope, acl reconstruction (hamstring autograft vs allograft), chondroplasty, and removal of hardware Discharge Medications: Tylenol, Meloxicam, Omeprazole, Zofran, Oxycodone  DVT Prophylaxis: Aspirin Physical Therapy: Outpatient PT Special Discharge needs: Knee immobilizer   Ethelda Chick, PA-C  06/07/2019 12:03 PM

## 2019-06-08 MED FILL — ONDANSETRON HCL 4 MG TABLET: 4 | 3 days supply | Qty: 10 | Fill #0

## 2019-06-08 MED FILL — OMEPRAZOLE DR 10 MG CAPSULE: 10 | 30 days supply | Qty: 30 | Fill #0

## 2019-06-08 MED FILL — MELOXICAM 7.5 MG TABLET: 7.5 | 30 days supply | Qty: 30 | Fill #0

## 2019-06-08 MED FILL — ACETAMINOPHEN EXTRA STRENGT: 500 | 17 days supply | Qty: 100 | Fill #0

## 2019-06-08 MED FILL — ASPIRIN CHILD 81 MG TAB CHE: 81 | 36 days supply | Qty: 72 | Fill #0

## 2019-06-08 MED FILL — oxyCODONE HCL 5 MG TABS: 5 | 5 days supply | Qty: 30 | Fill #0

## 2019-06-08 NOTE — Progress Notes (Signed)

## 2019-06-11 ENCOUNTER — Ambulatory Visit (HOSPITAL_BASED_OUTPATIENT_CLINIC_OR_DEPARTMENT_OTHER): Payer: 59 | Admitting: Anesthesiology

## 2019-06-11 ENCOUNTER — Ambulatory Visit (HOSPITAL_BASED_OUTPATIENT_CLINIC_OR_DEPARTMENT_OTHER)
Admission: RE | Admit: 2019-06-11 | Discharge: 2019-06-11 | Disposition: A | Discharge status: Home / Self Care | Payer: 59 | Attending: Orthopaedic Surgery | Admitting: Orthopaedic Surgery

## 2019-06-11 ENCOUNTER — Encounter (HOSPITAL_BASED_OUTPATIENT_CLINIC_OR_DEPARTMENT_OTHER): Payer: Self-pay | Admitting: Orthopaedic Surgery

## 2019-06-11 ENCOUNTER — Encounter (HOSPITAL_BASED_OUTPATIENT_CLINIC_OR_DEPARTMENT_OTHER)
Admission: RE | Disposition: A | Discharge status: Home / Self Care | Payer: Self-pay | Source: Home / Self Care | Attending: Orthopaedic Surgery

## 2019-06-11 DIAGNOSIS — Z79899 Other long term (current) drug therapy: Secondary | ICD-10-CM | POA: Insufficient documentation

## 2019-06-11 DIAGNOSIS — M13862 Other specified arthritis, left knee: Secondary | ICD-10-CM | POA: Diagnosis not present

## 2019-06-11 DIAGNOSIS — K219 Gastro-esophageal reflux disease without esophagitis: Secondary | ICD-10-CM | POA: Diagnosis not present

## 2019-06-11 DIAGNOSIS — T84498A Other mechanical complication of other internal orthopedic devices, implants and grafts, initial encounter: Secondary | ICD-10-CM | POA: Diagnosis not present

## 2019-06-11 DIAGNOSIS — M2242 Chondromalacia patellae, left knee: Secondary | ICD-10-CM | POA: Diagnosis not present

## 2019-06-11 DIAGNOSIS — M2352 Chronic instability of knee, left knee: Secondary | ICD-10-CM | POA: Diagnosis not present

## 2019-06-11 DIAGNOSIS — S83512A Sprain of anterior cruciate ligament of left knee, initial encounter: Secondary | ICD-10-CM | POA: Diagnosis not present

## 2019-06-11 DIAGNOSIS — G8918 Other acute postprocedural pain: Secondary | ICD-10-CM | POA: Diagnosis not present

## 2019-06-11 HISTORY — PX: KNEE ARTHROSCOPY WITH ANTERIOR CRUCIATE LIGAMENT (ACL) REPAIR: SHX5644

## 2019-06-11 HISTORY — PX: HARDWARE REMOVAL: SHX979

## 2019-06-11 HISTORY — DX: Other specified postprocedural states: R11.2

## 2019-06-11 HISTORY — DX: Other specified postprocedural states: Z98.890

## 2019-06-11 SURGERY — KNEE ARTHROSCOPY WITH ANTERIOR CRUCIATE LIGAMENT (ACL) REPAIR
Anesthesia: Regional | Site: Knee | Laterality: Left

## 2019-06-11 MED ORDER — CEFAZOLIN SODIUM-DEXTROSE 2-4 GM/100ML-% IV SOLN
INTRAVENOUS | Status: AC
  Filled 2019-06-11: qty 100

## 2019-06-11 MED ORDER — FENTANYL CITRATE (PF) 100 MCG/2ML IJ SOLN
INTRAMUSCULAR | Status: DC | PRN
  Administered 2019-06-11 (×8): 25 ug via INTRAVENOUS

## 2019-06-11 MED ORDER — PROPOFOL 10 MG/ML IV BOLUS
INTRAVENOUS | Status: DC | PRN
  Administered 2019-06-11: 200 mg via INTRAVENOUS

## 2019-06-11 MED ORDER — DEXAMETHASONE SODIUM PHOSPHATE 10 MG/ML IJ SOLN
INTRAMUSCULAR | Status: DC | PRN
  Administered 2019-06-11: 5 mg via INTRAVENOUS

## 2019-06-11 MED ORDER — DEXAMETHASONE SODIUM PHOSPHATE 10 MG/ML IJ SOLN
INTRAMUSCULAR | Status: DC | PRN
  Administered 2019-06-11: 5 mg

## 2019-06-11 MED ORDER — FENTANYL CITRATE (PF) 100 MCG/2ML IJ SOLN
INTRAMUSCULAR | Status: AC
  Filled 2019-06-11: qty 2

## 2019-06-11 MED ORDER — LACTATED RINGERS IV SOLN: INTRAVENOUS | Status: DC

## 2019-06-11 MED ORDER — BUPIVACAINE HCL (PF) 0.25 % IJ SOLN
INTRAMUSCULAR | Status: AC
  Filled 2019-06-11: qty 30

## 2019-06-11 MED ORDER — PHENYLEPHRINE HCL (PRESSORS) 10 MG/ML IV SOLN
INTRAVENOUS | Status: AC
  Filled 2019-06-11: qty 2

## 2019-06-11 MED ORDER — ACETAMINOPHEN 500 MG PO TABS
ORAL_TABLET | ORAL | Status: AC
  Filled 2019-06-11: qty 2

## 2019-06-11 MED ORDER — SCOPOLAMINE 1 MG/3DAYS TD PT72
1.0000 | MEDICATED_PATCH | TRANSDERMAL | Status: DC
  Administered 2019-06-11: 1.5 mg via TRANSDERMAL

## 2019-06-11 MED ORDER — VANCOMYCIN HCL 1000 MG IV SOLR
INTRAVENOUS | Status: AC
  Filled 2019-06-11: qty 1000

## 2019-06-11 MED ORDER — DEXAMETHASONE SODIUM PHOSPHATE 10 MG/ML IJ SOLN
INTRAMUSCULAR | Status: AC
  Filled 2019-06-11: qty 1

## 2019-06-11 MED ORDER — SCOPOLAMINE 1 MG/3DAYS TD PT72
MEDICATED_PATCH | TRANSDERMAL | Status: AC
  Filled 2019-06-11: qty 1

## 2019-06-11 MED ORDER — LIDOCAINE 2% (20 MG/ML) 5 ML SYRINGE
INTRAMUSCULAR | Status: AC
  Filled 2019-06-11: qty 10

## 2019-06-11 MED ORDER — SODIUM CHLORIDE 0.9 % IR SOLN
Status: DC | PRN
  Administered 2019-06-11: 500 mL

## 2019-06-11 MED ORDER — CEFAZOLIN SODIUM-DEXTROSE 2-4 GM/100ML-% IV SOLN
2.0000 g | INTRAVENOUS | Status: AC
  Administered 2019-06-11: 2 g via INTRAVENOUS

## 2019-06-11 MED ORDER — ACETAMINOPHEN 500 MG PO TABS
1000.0000 mg | ORAL_TABLET | Freq: Once | ORAL | Status: AC
  Administered 2019-06-11: 1000 mg via ORAL

## 2019-06-11 MED ORDER — OXYCODONE HCL 5 MG PO TABS
ORAL_TABLET | ORAL | Status: AC
  Filled 2019-06-11: qty 1

## 2019-06-11 MED ORDER — LIDOCAINE HCL (CARDIAC) PF 100 MG/5ML IV SOSY
PREFILLED_SYRINGE | INTRAVENOUS | Status: DC | PRN
  Administered 2019-06-11: 30 mg via INTRAVENOUS

## 2019-06-11 MED ORDER — ONDANSETRON HCL 4 MG/2ML IJ SOLN
INTRAMUSCULAR | Status: DC | PRN
  Administered 2019-06-11: 4 mg via INTRAVENOUS

## 2019-06-11 MED ORDER — PROPOFOL 10 MG/ML IV BOLUS
INTRAVENOUS | Status: AC
  Filled 2019-06-11: qty 20

## 2019-06-11 MED ORDER — MIDAZOLAM HCL 2 MG/2ML IJ SOLN
INTRAMUSCULAR | Status: AC
  Filled 2019-06-11: qty 2

## 2019-06-11 MED ORDER — FENTANYL CITRATE (PF) 100 MCG/2ML IJ SOLN
25.0000 ug | INTRAMUSCULAR | Status: DC | PRN
  Administered 2019-06-11 (×2): 50 ug via INTRAVENOUS

## 2019-06-11 MED ORDER — MIDAZOLAM HCL 2 MG/2ML IJ SOLN
1.0000 mg | INTRAMUSCULAR | Status: DC | PRN
  Administered 2019-06-11: 2 mg via INTRAVENOUS

## 2019-06-11 MED ORDER — VANCOMYCIN HCL 1000 MG IV SOLR
INTRAVENOUS | Status: DC | PRN
  Administered 2019-06-11: 1000 mg

## 2019-06-11 MED ORDER — ONDANSETRON HCL 4 MG/2ML IJ SOLN
INTRAMUSCULAR | Status: AC
  Filled 2019-06-11: qty 8

## 2019-06-11 MED ORDER — EPINEPHRINE PF 1 MG/ML IJ SOLN
INTRAMUSCULAR | Status: AC
  Filled 2019-06-11: qty 2

## 2019-06-11 MED ORDER — FENTANYL CITRATE (PF) 100 MCG/2ML IJ SOLN
50.0000 ug | INTRAMUSCULAR | Status: DC | PRN
  Administered 2019-06-11: 100 ug via INTRAVENOUS

## 2019-06-11 MED ORDER — ROPIVACAINE HCL 5 MG/ML IJ SOLN
INTRAMUSCULAR | Status: DC | PRN
  Administered 2019-06-11: 20 mL via PERINEURAL

## 2019-06-11 MED ORDER — CHLORHEXIDINE GLUCONATE 4 % EX LIQD: 60.0000 mL | Freq: Once | CUTANEOUS | Status: DC

## 2019-06-11 MED ORDER — PROPOFOL 500 MG/50ML IV EMUL
INTRAVENOUS | Status: DC | PRN
  Administered 2019-06-11: 25 ug/kg/min via INTRAVENOUS

## 2019-06-11 MED ORDER — EPHEDRINE 5 MG/ML INJ
INTRAVENOUS | Status: AC
  Filled 2019-06-11: qty 10

## 2019-06-11 MED ORDER — OXYCODONE HCL 5 MG PO TABS
5.0000 mg | ORAL_TABLET | Freq: Once | ORAL | Status: AC
  Administered 2019-06-11: 5 mg via ORAL

## 2019-06-11 MED ORDER — SODIUM CHLORIDE 0.9 % IR SOLN
Status: DC | PRN
  Administered 2019-06-11: 09:00:00 6000 mL

## 2019-06-11 SURGICAL SUPPLY — 106 items
ANCHOR BUTTON TIGHTROPE ACL RT (Orthopedic Implant) ×3 IMPLANT
BLADE HEX COATED 2.75 (ELECTRODE) ×3 IMPLANT
BLADE SHAVER BONE 5.0X13 (MISCELLANEOUS) ×3 IMPLANT
BLADE SURG 10 STRL SS (BLADE) ×3 IMPLANT
BLADE SURG 15 STRL LF DISP TIS (BLADE) ×2 IMPLANT
BLADE SURG 15 STRL SS (BLADE) ×1
BNDG COHESIVE 4X5 TAN STRL (GAUZE/BANDAGES/DRESSINGS) IMPLANT
BNDG ELASTIC 3X5.8 VLCR STR LF (GAUZE/BANDAGES/DRESSINGS) IMPLANT
BNDG ELASTIC 4X5.8 VLCR STR LF (GAUZE/BANDAGES/DRESSINGS) ×3 IMPLANT
BNDG ELASTIC 6X5.8 VLCR STR LF (GAUZE/BANDAGES/DRESSINGS) ×3 IMPLANT
BNDG ESMARK 4X9 LF (GAUZE/BANDAGES/DRESSINGS) IMPLANT
BNDG GAUZE ELAST 4 BULKY (GAUZE/BANDAGES/DRESSINGS) IMPLANT
BONE TUNNEL PLUG CANNULATED (MISCELLANEOUS) IMPLANT
BRUSH SCRUB EZ PLAIN DRY (MISCELLANEOUS) IMPLANT
BURR OVAL 8 FLU 4.0X13 (MISCELLANEOUS) ×3 IMPLANT
CANISTER SUCT 1200ML W/VALVE (MISCELLANEOUS) IMPLANT
CHLORAPREP W/TINT 26 (MISCELLANEOUS) ×3 IMPLANT
CLSR STERI-STRIP ANTIMIC 1/2X4 (GAUZE/BANDAGES/DRESSINGS) ×3 IMPLANT
CORD BIPOLAR FORCEPS 12FT (ELECTRODE) IMPLANT
COVER BACK TABLE 60X90IN (DRAPES) ×3 IMPLANT
COVER WAND RF STERILE (DRAPES) IMPLANT
CUFF TOURN SGL QUICK 18X4 (TOURNIQUET CUFF) IMPLANT
CUFF TOURN SGL QUICK 34 (TOURNIQUET CUFF) ×1
CUFF TRNQT CYL 34X4.125X (TOURNIQUET CUFF) ×2 IMPLANT
CUTTER TENSIONER SUT 2-0 0 FBW (INSTRUMENTS) IMPLANT
DECANTER SPIKE VIAL GLASS SM (MISCELLANEOUS) IMPLANT
DISSECTOR 3.5MM X 13CM CVD (MISCELLANEOUS) ×3 IMPLANT
DISSECTOR 4.0MMX13CM CVD (MISCELLANEOUS) IMPLANT
DRAPE ARTHROSCOPY W/POUCH 90 (DRAPES) ×3 IMPLANT
DRAPE EXTREMITY T 121X128X90 (DISPOSABLE) IMPLANT
DRAPE IMP U-DRAPE 54X76 (DRAPES) ×3 IMPLANT
DRAPE INCISE IOBAN 66X45 STRL (DRAPES) IMPLANT
DRAPE OEC MINIVIEW 54X84 (DRAPES) ×3 IMPLANT
DRAPE SURG 17X23 STRL (DRAPES) IMPLANT
DRAPE TOP ARMCOVERS (MISCELLANEOUS) ×3 IMPLANT
DRAPE U-SHAPE 47X51 STRL (DRAPES) ×3 IMPLANT
DRILL FLIPCUTTER III 6-12 (ORTHOPEDIC DISPOSABLE SUPPLIES) ×2 IMPLANT
DRSG EMULSION OIL 3X3 NADH (GAUZE/BANDAGES/DRESSINGS) IMPLANT
ELECT REM PT RETURN 9FT ADLT (ELECTROSURGICAL) ×3
ELECTRODE REM PT RTRN 9FT ADLT (ELECTROSURGICAL) ×2 IMPLANT
FIBERSTICK 2 (SUTURE) ×3 IMPLANT
FLIPCUTTER III 6-12 AR-1204FF (ORTHOPEDIC DISPOSABLE SUPPLIES) ×3
GAUZE SPONGE 4X4 12PLY STRL (GAUZE/BANDAGES/DRESSINGS) ×3 IMPLANT
GAUZE XEROFORM 1X8 LF (GAUZE/BANDAGES/DRESSINGS) IMPLANT
GLOVE BIO SURGEON STRL SZ 6.5 (GLOVE) ×3 IMPLANT
GLOVE BIOGEL PI IND STRL 6.5 (GLOVE) ×2 IMPLANT
GLOVE BIOGEL PI IND STRL 7.0 (GLOVE) ×2 IMPLANT
GLOVE BIOGEL PI IND STRL 8 (GLOVE) ×2 IMPLANT
GLOVE BIOGEL PI INDICATOR 6.5 (GLOVE) ×1
GLOVE BIOGEL PI INDICATOR 7.0 (GLOVE) ×1
GLOVE BIOGEL PI INDICATOR 8 (GLOVE) ×1
GLOVE ECLIPSE 6.5 STRL STRAW (GLOVE) ×3 IMPLANT
GLOVE ECLIPSE 8.0 STRL XLNG CF (GLOVE) ×6 IMPLANT
GOWN STRL REUS W/ TWL LRG LVL3 (GOWN DISPOSABLE) ×4 IMPLANT
GOWN STRL REUS W/ TWL XL LVL3 (GOWN DISPOSABLE) ×2 IMPLANT
GOWN STRL REUS W/TWL LRG LVL3 (GOWN DISPOSABLE) ×2
GOWN STRL REUS W/TWL XL LVL3 (GOWN DISPOSABLE) ×4 IMPLANT
GUIDEPIN FLEX PATHFINDER 2.4MM (WIRE) ×3 IMPLANT
IMMOBILIZER KNEE 20 (SOFTGOODS)
IMMOBILIZER KNEE 20 THIGH 36 (SOFTGOODS) IMPLANT
IMMOBILIZER KNEE 22 UNIV (SOFTGOODS) ×3 IMPLANT
IMP SYS 2ND FIX PEEK 4.75X19.1 (Miscellaneous) ×3 IMPLANT
IMPL SCREW BIO 8X30 (Screw) ×2 IMPLANT
IMPL SYS 2ND FX PEEK 4.75X19.1 (Miscellaneous) ×2 IMPLANT
IMPLANT SCREW BIO 8X30 (Screw) ×3 IMPLANT
IV NS IRRIG 3000ML ARTHROMATIC (IV SOLUTION) ×12 IMPLANT
KIT LEG STABILIZATION (KITS) IMPLANT
KIT TRANSTIBIAL (DISPOSABLE) ×3 IMPLANT
NDL SAFETY ECLIPSE 18X1.5 (NEEDLE) ×2 IMPLANT
NEEDLE HYPO 18GX1.5 SHARP (NEEDLE) ×1
NS IRRIG 1000ML POUR BTL (IV SOLUTION) IMPLANT
PACK ARTHROSCOPY DSU (CUSTOM PROCEDURE TRAY) ×3 IMPLANT
PACK BASIN DAY SURGERY FS (CUSTOM PROCEDURE TRAY) ×3 IMPLANT
PAD CAST 4YDX4 CTTN HI CHSV (CAST SUPPLIES) IMPLANT
PADDING CAST COTTON 4X4 STRL (CAST SUPPLIES)
PENCIL SMOKE EVACUATOR (MISCELLANEOUS) ×3 IMPLANT
PORT APPOLLO RF 90DEGREE MULTI (SURGICAL WAND) ×3 IMPLANT
SHEET MEDIUM DRAPE 40X70 STRL (DRAPES) ×3 IMPLANT
SLEEVE SCD COMPRESS KNEE MED (MISCELLANEOUS) ×3 IMPLANT
SPONGE LAP 4X18 RFD (DISPOSABLE) ×3 IMPLANT
STOCKINETTE 4X48 STRL (DRAPES) IMPLANT
SUCTION FRAZIER HANDLE 10FR (MISCELLANEOUS)
SUCTION TUBE FRAZIER 10FR DISP (MISCELLANEOUS) IMPLANT
SUT 0 FIBERLOOP 38 BLUE TPR ND (SUTURE)
SUT FIBERWIRE #2 38 T-5 BLUE (SUTURE)
SUT MNCRL AB 4-0 PS2 18 (SUTURE) ×6 IMPLANT
SUT VIC AB 0 CT1 27 (SUTURE)
SUT VIC AB 0 CT1 27XBRD ANBCTR (SUTURE) IMPLANT
SUT VIC AB 0 SH 27 (SUTURE) IMPLANT
SUT VIC AB 1 CT1 27 (SUTURE) ×2
SUT VIC AB 1 CT1 27XBRD ANBCTR (SUTURE) ×4 IMPLANT
SUT VIC AB 2-0 SH 27 (SUTURE) ×2
SUT VIC AB 2-0 SH 27XBRD (SUTURE) ×4 IMPLANT
SUT VIC AB 3-0 SH 27 (SUTURE)
SUT VIC AB 3-0 SH 27X BRD (SUTURE) IMPLANT
SUTURE 0 FIBERLP 38 BLU TPR ND (SUTURE) IMPLANT
SUTURE FIBERWR #2 38 T-5 BLUE (SUTURE) IMPLANT
SUTURE TAPE 1.3 FIBERLOP 20 ST (SUTURE) ×4 IMPLANT
SUTURETAPE 1.3 FIBERLOOP 20 ST (SUTURE) ×6
SYR 5ML LL (SYRINGE) ×3 IMPLANT
SYR BULB 3OZ (MISCELLANEOUS) IMPLANT
TOWEL GREEN STERILE FF (TOWEL DISPOSABLE) ×9 IMPLANT
TUBE CONNECTING 20X1/4 (TUBING) ×3 IMPLANT
TUBE SUCTION HIGH CAP CLEAR NV (SUCTIONS) ×3 IMPLANT
TUBING ARTHROSCOPY IRRIG 16FT (MISCELLANEOUS) ×3 IMPLANT
YANKAUER SUCT BULB TIP NO VENT (SUCTIONS) IMPLANT

## 2019-06-11 NOTE — Progress Notes (Signed)
Assisted Dr. Lanetta Inch with left, ultrasound guided, adductor canal block. Side rails up, monitors on throughout procedure. See vital signs in flow sheet. Tolerated Procedure well.

## 2019-06-11 NOTE — Interval H&P Note (Signed)
All questions were answered. Patient elected to proceed.

## 2019-06-11 NOTE — Anesthesia Preprocedure Evaluation (Addendum)
Anesthesia Evaluation  Patient identified by MRN, date of birth, ID band Patient awake    Reviewed: Allergy & Precautions, NPO status , Patient's Chart, lab work & pertinent test results  History of Anesthesia Complications (+) PONV and history of anesthetic complications  Airway Mallampati: I  TM Distance: >3 FB Neck ROM: Full    Dental no notable dental hx. (+) Teeth Intact, Dental Advisory Given   Pulmonary neg pulmonary ROS,    Pulmonary exam normal breath sounds clear to auscultation       Cardiovascular negative cardio ROS Normal cardiovascular exam Rhythm:Regular Rate:Normal     Neuro/Psych negative neurological ROS  negative psych ROS   GI/Hepatic Neg liver ROS, GERD  ,  Endo/Other  negative endocrine ROS  Renal/GU negative Renal ROS  negative genitourinary   Musculoskeletal negative musculoskeletal ROS (+)   Abdominal   Peds  Hematology negative hematology ROS (+)   Anesthesia Other Findings   Reproductive/Obstetrics                            Anesthesia Physical Anesthesia Plan  ASA: II  Anesthesia Plan: General and Regional   Post-op Pain Management:  Regional for Post-op pain   Induction: Intravenous  PONV Risk Score and Plan: 3 and Ondansetron, Dexamethasone, Midazolam and Scopolamine patch - Pre-op  Airway Management Planned: LMA  Additional Equipment:   Intra-op Plan:   Post-operative Plan: Extubation in OR  Informed Consent: I have reviewed the patients History and Physical, chart, labs and discussed the procedure including the risks, benefits and alternatives for the proposed anesthesia with the patient or authorized representative who has indicated his/her understanding and acceptance.     Dental advisory given  Plan Discussed with: CRNA  Anesthesia Plan Comments:         Anesthesia Quick Evaluation

## 2019-06-11 NOTE — Op Note (Signed)
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 Suprapatellar pouch: Grade 2 and 3 changes in the patellofemoral joint mostly on the trochlea Patellofemoral Compartment: As above Medial Compartment: Mild grade 1 changes on the femur and some fraying and previous signs of meniscectomy of the medial meniscus Lateral Compartment: Normal Intercondylar Notch: Vertical graft with femoral screw at the 12 o'clock position.  The tibial tunnel was slightly more posterior than I would ideally like to however not completely overlapping with the ideal position we felt that bone grafting and coming back would not be in the patient's best interest.  We did place her knee tunnel in a more acceptable anterior position however there is some slight graft isometry secondary to the overall 1 to 2 mm slightly posterior placement of the aperture of the tibial tunnel.  Successful completion of the planned procedure.  Patient's knee was in overall better shape from a chondral standpoint in the contralateral side however his graft was extremely vertical.  His aperture of his tibial tunnel did lead to a slightly and isometric graft that we had good tension in 45 degrees of flexion to full extension and reasonable tension and 90 degrees of flexion.  Post-operative plan: The patient will be discharged home.  The patient will be weightbearing as tolerated in a knee brace transition to a Bledsoe at his discretion at home.  DVT prophylaxis Aspirin 81 mg twice daily for 6 weeks.  Pain control with PRN pain medication preferring oral  medicines.  Follow up plan will be scheduled in approximately 7 days for incision check and XR.  Post-Op Diagnosis: Same Surgeons:Primary: Hiram Gash, MD Assistants:Caroline McBane PA-C Location: [facility] ROOM 5 Anesthesia: General with abductor canal Antibiotics: Ancef 2 g with local vancomycin powder 1 g at the surgical site Tourniquet time:  Total Tourniquet Time Documented: Thigh (Left) - 105 minutes Total: Thigh (Left) - 105 minutes  Estimated Blood Loss: Minimal Complications: None Specimens: None Implants: Implant Name Type Inv. Item Serial No. Manufacturer Lot No. LRB No. Used Action  TIGHTROPE RT - [id number] Orthopedic Implant TIGHTROPE RT  [employer] [id number] Left 1 Implanted  IMP SYS 2ND FIX PEEK 4.75X19.1 - [id number] Miscellaneous IMP SYS 2ND FIX PEEK 4.75X19.1  [employer] [id number] Left 1 Implanted  IMPLANT SCREW BIO 8X30 - [id number] Screw IMPLANT SCREW BIO 8X30  [employer] [id number] Left 1 Implanted    Indications for Surgery:   TIA MAGA is a 39 y.o. male with previous ACL reconstruction and rotational instability.  He had a similar condition on the contralateral side and did well with autograft hamstring ACL reconstruction.  We talked about the possibility of rerupture and understanding this he like to proceed.  Benefits and risks of operative and nonoperative management were discussed prior to surgery with patient/guardian(s) and informed consent form was completed.  Specific risks including infection, need for additional surgery, rerupture, stiffness, continued pain   Procedure:   The patient was identified properly. Informed consent was obtained and the surgical site was marked. The patient was taken up to suite where general anesthesia was induced. The patient was placed in the supine  position with a post against the surgical leg and a nonsterile tourniquet applied. The surgical leg was then prepped and draped usual sterile fashion.  A standard surgical timeout  was performed.   We began with the autograft hamstring harvest.  He made an incision using the inferior most aspect of his previous incision.  Went through skin sharply and there was significant scar.  We achieved hemostasis we progressed.  We identified the fascia and opened it and were able to find both the hamstring tendons that we typically harvest, gracilis and semitendinosus, without issue.  We did free them up from underlying scar and then were able to harvest in the typical fashion.  Total graft length with 240 cut down to 200.  Doubled over 8.5 mm graft was obtained and prepped in the usual fashion with a Ar[employer]rex tight rope and whipstitching each end of the graft.  This point we identified the previously placed tibial screw.  We had felt that it was likely a [place] style screw however the screwdrivers did not fit.  We then used a series of osteotomes as well as [employer] hardware removal trephine and were able to carefully remove the screw with minimal bone loss and good cortical retained bone.  We then used an arthroscope and made typical anterior portals under spinal needle localization.  The joint was cleared and gentle chondroplasty was performed of the patellofemoral joint mostly involving the trochlea.  We then turned our attention to resecting the previously placed graft which was in the 12 o'clock position and far anterior at about the 50% mark anterior to posterior.  We were able to identify the typical location of the graft and marked.  We then did a gentle notchplasty and were able to use a retrograde reamer from the Arthrex set and reamed a 8.5 mm x 20 mm tunnel in the femur at the 2:30 position.  We did not have to remove the patient's femoral screw as it was far away from our tunnel.  Once we had a good tunnel bone debris was removed with a shaver and the passing suture was placed.  We turned our attention to the tibia and used a 2.4 mm guidewire and placed it into the previous  tunnel trying to gently anterior as it.  We reamed to 8 mm tunnel and then carefully dilated it to an 8.5 noting that we had some overlap with our previous aperture however it was minimal.  We used a curette to clear any soft tissue debris from the tunnel and used the scope to verify the tunnel was intact throughout and had good bone for graft healing.  This point were able to Under direct arthroscopic visualization and verify that the button was flipped on the lateral femoral cortex.  We would then use the tight rope device to pull the graft into the femoral tunnel and then we cycled the graft multiple times before placing a 8 x 30 mm bioabsorbable Arthrex screw in the tibia obtaining good purchase.  In setting the revision we did also backed this up with a 4.75 mm swivel lock in the anterior medial tibia.  Graft was checked arthroscopically and had good tension and took a load with anterior drawer.  We then checked a gentle Lachman and noted that it was no translation and good endpoint.  Incisions were irrigated copiously before local vancomycin powder was placed in the fascia was closed.  All sutures were cut and incisions were all closed in a multilayer fashion  with absorbable sutures.  Sterile dressing and knee immobilizer replaced.  Patient was awoken taken to PACU in stable condition.  Noemi Chapel, PA-C, present and scrubbed throughout the case, critical for completion in a timely fashion, and for retraction, instrumentation, closure.

## 2019-06-11 NOTE — Anesthesia Postprocedure Evaluation (Signed)
Anesthesia Post Note  Patient: Joshua Gaines  Procedure(s) Performed: KNEE ARTHROSCOPY WITH ANTERIOR CRUCIATE LIGAMENT (ACL) REPAIR WITH HAMSTRING AUTOGRAFT (Left Knee) HARDWARE REMOVAL (Left Knee)     Patient location during evaluation: PACU Anesthesia Type: Regional and General Level of consciousness: awake and alert Pain management: pain level controlled Vital Signs Assessment: post-procedure vital signs reviewed and stable Respiratory status: spontaneous breathing, nonlabored ventilation, respiratory function stable and patient connected to nasal cannula oxygen Cardiovascular status: blood pressure returned to baseline and stable Postop Assessment: no apparent nausea or vomiting Anesthetic complications: no    Last Vitals:  Vitals:   06/11/19 1131 06/11/19 1145  BP:  121/68  Pulse: 64 67  Resp: (!) 22 18  Temp:  37.2 C  SpO2: 100% 99%    Last Pain:  Vitals:   06/11/19 1145  TempSrc:   PainSc: 4                  Shelina Luo L Hazelene Doten

## 2019-06-11 NOTE — Anesthesia Procedure Notes (Signed)
Anesthesia Regional Block: Adductor canal block   Pre-Anesthetic Checklist: ,, timeout performed, Correct Patient, Correct Site, Correct Laterality, Correct Procedure, Correct Position, site marked, Risks and benefits discussed,  Surgical consent,  Pre-op evaluation,  At surgeon's request and post-op pain management  Laterality: Left  Prep: Maximum Sterile Barrier Precautions used, chloraprep       Needles:  Injection technique: Single-shot  Needle Type: Echogenic Stimulator Needle     Needle Length: 9cm  Needle Gauge: 22     Additional Needles:   Procedures:,,,, ultrasound used (permanent image in chart),,,,  Narrative:  Start time: 06/11/2019 7:03 AM End time: 06/11/2019 7:13 AM Injection made incrementally with aspirations every 5 mL.  Performed by: Personally  Anesthesiologist: Freddrick March, MD  Additional Notes: Monitors applied. No increased pain on injection. No increased resistance to injection. Injection made in 5cc increments. Good needle visualization. Patient tolerated procedure well.

## 2019-06-11 NOTE — Anesthesia Procedure Notes (Signed)
Procedure Name: LMA Insertion Date/Time: 06/11/2019 7:32 AM Performed by: Alando Colleran, Ernesta Amble, CRNA Pre-anesthesia Checklist: Patient identified, Emergency Drugs available, Suction available and Patient being monitored Patient Re-evaluated:Patient Re-evaluated prior to induction Oxygen Delivery Method: Circle system utilized Preoxygenation: Pre-oxygenation with 100% oxygen Induction Type: IV induction Ventilation: Mask ventilation without difficulty LMA: LMA inserted LMA Size: 4.0 Number of attempts: 1 Airway Equipment and Method: Bite block Placement Confirmation: positive ETCO2 Tube secured with: Tape Dental Injury: Teeth and Oropharynx as per pre-operative assessment

## 2019-06-11 NOTE — Transfer of Care (Signed)
Immediate Anesthesia Transfer of Care Note  Patient: TAIWO GAGLIANO  Procedure(s) Performed: KNEE ARTHROSCOPY WITH ANTERIOR CRUCIATE LIGAMENT (ACL) REPAIR WITH HAMSTRING AUTOGRAFT (Left Knee) HARDWARE REMOVAL (Left Knee)  Patient Location: PACU  Anesthesia Type:GA combined with regional for post-op pain  Level of Consciousness: drowsy and patient cooperative  Airway & Oxygen Therapy: Patient Spontanous Breathing and Patient connected to face mask oxygen  Post-op Assessment: Report given to RN and Post -op Vital signs reviewed and stable  Post vital signs: Reviewed and stable  Last Vitals:  Vitals Value Taken Time  BP 100/40 06/11/19 0942  Temp    Pulse 65 06/11/19 0944  Resp 21 06/11/19 0944  SpO2 100 % 06/11/19 0944  Vitals shown include unvalidated device data.  Last Pain:  Vitals:   06/11/19 0705  TempSrc: Oral  PainSc: 0-No pain         Complications: No apparent anesthesia complications

## 2019-06-11 NOTE — Discharge Instructions (Signed)
*  You had 1000 MG of Tylenol at 6:45 AM *You had 5 MG of Oxycodone at 11:45 AM  Post Anesthesia Home Care Instructions  Activity: Get plenty of rest for the remainder of the day. A responsible individual must stay with you for 24 hours following the procedure.  For the next 24 hours, DO NOT: -Drive a car -Paediatric nurse -Drink alcoholic beverages -Take any medication unless instructed by your physician -Make any legal decisions or sign important papers.  Meals: Start with liquid foods such as gelatin or soup. Progress to regular foods as tolerated. Avoid greasy, spicy, heavy foods. If nausea and/or vomiting occur, drink only clear liquids until the nausea and/or vomiting subsides. Call your physician if vomiting continues.  Special Instructions/Symptoms: Your throat may feel dry or sore from the anesthesia or the breathing tube placed in your throat during surgery. If this causes discomfort, gargle with warm salt water. The discomfort should disappear within 24 hours.  If you had a scopolamine patch placed behind your ear for the management of post- operative nausea and/or vomiting:  1. The medication in the patch is effective for 72 hours, after which it should be removed.  Wrap patch in a tissue and discard in the trash. Wash hands thoroughly with soap and water. 2. You may remove the patch earlier than 72 hours if you experience unpleasant side effects which may include dry mouth, dizziness or visual disturbances. 3. Avoid touching the patch. Wash your hands with soap and water after contact with the patch.    Regional Anesthesia Blocks  1. Numbness or the inability to move the "blocked" extremity may last from 3-48 hours after placement. The length of time depends on the medication injected and your individual response to the medication. If the numbness is not going away after 48 hours, call your surgeon.  2. The extremity that is blocked will need to be protected until the  numbness is gone and the  Strength has returned. Because you cannot feel it, you will need to take extra care to avoid injury. Because it may be weak, you may have difficulty moving it or using it. You may not know what position it is in without looking at it while the block is in effect.  3. For blocks in the legs and feet, returning to weight bearing and walking needs to be done carefully. You will need to wait until the numbness is entirely gone and the strength has returned. You should be able to move your leg and foot normally before you try and bear weight or walk. You will need someone to be with you when you first try to ensure you do not fall and possibly risk injury.  4. Bruising and tenderness at the needle site are common side effects and will resolve in a few days.  5. Persistent numbness or new problems with movement should be communicated to the surgeon or the [facility] [phone] [place] [phone]).

## 2019-06-12 ENCOUNTER — Encounter: Payer: Self-pay | Admitting: *Deleted

## 2019-06-19 DIAGNOSIS — S83512D Sprain of anterior cruciate ligament of left knee, subsequent encounter: Secondary | ICD-10-CM | POA: Diagnosis not present

## 2019-06-26 ENCOUNTER — Encounter: Payer: Self-pay | Admitting: Physical Therapy

## 2019-06-26 ENCOUNTER — Ambulatory Visit: Payer: 59 | Attending: Sports Medicine | Admitting: Physical Therapy

## 2019-06-26 ENCOUNTER — Other Ambulatory Visit: Payer: Self-pay

## 2019-06-26 DIAGNOSIS — M25662 Stiffness of left knee, not elsewhere classified: Secondary | ICD-10-CM | POA: Insufficient documentation

## 2019-06-26 DIAGNOSIS — M6281 Muscle weakness (generalized): Secondary | ICD-10-CM | POA: Diagnosis present

## 2019-06-26 DIAGNOSIS — G8929 Other chronic pain: Secondary | ICD-10-CM | POA: Diagnosis present

## 2019-06-26 DIAGNOSIS — M25562 Pain in left knee: Secondary | ICD-10-CM | POA: Insufficient documentation

## 2019-06-26 DIAGNOSIS — R600 Localized edema: Secondary | ICD-10-CM | POA: Diagnosis present

## 2019-06-26 NOTE — Therapy (Signed)
[place], [place], [address] Phone: [phone]   Fax:  [phone]  Physical Therapy Evaluation  Patient Details  Name: Joshua Gaines MRN: [id number] Date of Birth: 24-Aug-1980 Referring Provider (PT): Clayton Lefort MD   Encounter Date: 06/26/2019  PT End of Session - 06/26/19 1357    Visit Number  1    Number of Visits  17    Date for PT Re-Evaluation  08/21/19    PT Start Time  1339    PT Stop Time  1425    PT Time Calculation (min)  46 min    Activity Tolerance  Patient tolerated treatment well    Behavior During Therapy  [facility] for tasks assessed/performed       Past Medical History:  Diagnosis Date  . GERD (gastroesophageal reflux disease)   . PONV (postoperative nausea and vomiting)     Past Surgical History:  Procedure Laterality Date  . ANTERIOR CRUCIATE LIGAMENT REPAIR  [id number]  . CHONDROPLASTY Right 02/01/2018   Procedure: CHONDROPLASTY;  Surgeon: Hiram Gash, MD;  Location: [place];  Service: Orthopedics;  Laterality: Right;  . HARDWARE REMOVAL Right 02/01/2018   Procedure: HARDWARE REMOVAL;  Surgeon: Hiram Gash, MD;  Location: [place];  Service: Orthopedics;  Laterality: Right;  . HARDWARE REMOVAL Left 06/11/2019   Procedure: HARDWARE REMOVAL;  Surgeon: Hiram Gash, MD;  Location: [place];  Service: Orthopedics;  Laterality: Left;  . HERNIA REPAIR  aprox 2010 or 2013   right inguinal   . KNEE ARTHROSCOPY WITH ANTERIOR CRUCIATE LIGAMENT (ACL) REPAIR Left 06/11/2019   Procedure: KNEE ARTHROSCOPY WITH ANTERIOR CRUCIATE LIGAMENT (ACL) REPAIR WITH HAMSTRING AUTOGRAFT;  Surgeon: Hiram Gash, MD;  Location: [place];  Service: Orthopedics;  Laterality: Left;  . KNEE ARTHROSCOPY WITH ANTERIOR CRUCIATE LIGAMENT (ACL) REPAIR WITH HAMSTRING GRAFT Right 02/01/2018   Procedure: RIGHT KNEE ARTHROSCOPY WITH POSSIBLE REVISION  ANTERIOR CRUCIATE LIGAMENT (ACL) REPAIR WITH AUTOGRAFT HAMSTRING;  Surgeon: Hiram Gash, MD;  Location: [place];  Service: Orthopedics;  Laterality: Right;  . KNEE ARTHROSCOPY WITH LATERAL MENISECTOMY Right 02/01/2018   Procedure: RIGHT KNEE ARTHROSCOPY WITH LATERAL MENISECTOMY;  Surgeon: Hiram Gash, MD;  Location: [place];  Service: Orthopedics;  Laterality: Right;  . KNEE ARTHROSCOPY WITH MEDIAL MENISECTOMY Right 02/01/2018   Procedure: RIGHT KNEE ARTHROSCOPY WITH MEDIAL MENISECTOMY, CHONDROPLASTY;  Surgeon: Hiram Gash, MD;  Location: [place];  Service: Orthopedics;  Laterality: Right;  . MENISCUS REPAIR     left knee    There were no vitals filed for this visit.   Subjective Assessment - 06/26/19 1341    Subjective  pt is a 39 y.o s/p L ACL reconstruction with hamstring auto graft on 06/11/2019. Since surgery everything has been going pretty good, and has gradually increasing ROM 20 degrees every day. He reports pain noted at extreme end range flexion. soreness at the distal hamstring attachment    How long can you sit comfortably?  unlimited    How long can you stand comfortably?  unlimited    How long can you walk comfortably?  unlimited    Patient Stated Goals  decrease pain, get back into shape, return to playing basketball    Currently in Pain?  Yes    Pain Score  0-No pain    Pain Location  Knee    Pain Orientation  Left  Pain Descriptors / Indicators  Sore    Pain Type  Surgical pain    Pain Frequency  Intermittent    Aggravating Factors   bending the knee, any quick movements    Pain Relieving Factors  medication         [facility] PT Assessment - 06/26/19 1348      Assessment   Medical Diagnosis  L knee revision ACL reconstruction with Auto graft hamstring and hardware removal    Referring Provider (PT)  Clayton Lefort MD    Onset Date/Surgical Date  --   06/11/2019   Hand Dominance  Right    Next MD Visit  --    March   Prior Therapy  yes      Precautions   Precautions  Knee      Restrictions   Weight Bearing Restrictions  No      Balance Screen   Has the patient fallen in the past 6 months  No      [place] residence    Living Arrangements  Spouse/significant other    Available Help at Discharge  Family    Type of [place] to enter    Entrance Stairs-Number of Steps  3    Entrance Stairs-Rails  None    Home Layout  Two level    Alternate Level Stairs-Number of Steps  18    Alternate Level Stairs-Rails  Left   ascending   Home Equipment  --   brace     Prior Function   Level of Independence  Independent with basic ADLs    Vocation  Full time employment      Cognition   Overall Cognitive Status  Within Functional Limits for tasks assessed      Observation/Other Assessments   Focus on Therapeutic Outcomes (FOTO)   53% limited   33% limtied     Posture/Postural Control   Posture/Postural Control  No significant limitations      ROM / Strength   AROM / PROM / Strength  AROM;Strength      AROM   AROM Assessment Site  Knee    Right/Left Knee  Right;Left    Left Knee Extension  -7    Left Knee Flexion  92      Strength   Overall Strength Comments  LLE strength not assessed due to precations    Strength Assessment Site  Knee    Right/Left Knee  Right;Left    Right Knee Flexion  5/5    Right Knee Extension  5/5    Left Knee Flexion  5/5      Palpation   Palpation comment  TTP at the pes anserine and semi-tendinosus      Ambulation/Gait   Ambulation/Gait  Yes    Gait Pattern  Step-through pattern;Decreased stance time - left;Decreased step length - right;Trendelenburg;Antalgic    Gait Comments  with use of hinged knee brace                Objective measurements completed on examination: See above findings.      [facility] Adult PT Treatment/Exercise - 06/26/19 1348      Modalities    Modalities  Vasopneumatic      Vasopneumatic   Number Minutes Vasopneumatic   15 minutes    Vasopnuematic Location   Knee    Vasopneumatic Pressure  High    Vasopneumatic Temperature  34      Manual Therapy   Manual Therapy  Joint mobilization             PT Education - 06/26/19 1723    Education Details  evaluation findings, POC, goals, HEP with proper form/ rationale    Person(s) Educated  Patient    Methods  Explanation;Verbal cues;Handout    Comprehension  Verbalized understanding;Verbal cues required       PT Short Term Goals - 06/26/19 1728      PT SHORT TERM GOAL #1   Title  pt to be I with inital HEp     Period  Weeks    Status  New    Target Date  07/24/19      PT SHORT TERM GOAL #2   Title  pt to verbalize and demo techniques to reduce pain and inflammation via RICE and HEP    Time  4    Period  Weeks    Status  New    Target Date  07/24/19      PT SHORT TERM GOAL #3   Title  pt to demo good quad activation and SLR with </= 5 degree quad lag for functional progression    Period  Weeks    Status  New    Target Date  07/24/19      PT SHORT TERM GOAL #4   Title  pt to increase knee flexion to >/= 110 degrees with </= 4/10 pain for therapuetic progression    Time  4    Period  Weeks    Status  New    Target Date  07/24/19        PT Long Term Goals - 06/26/19 1729      PT LONG TERM GOAL #1   Title  increase ROM to </= 2 degrees and >/= 120 degrees with </= 1/10 pain for functional ROM required for efficent gait and ADLs    Baseline  -    Time  8    Period  Weeks    Status  New    Target Date  08/21/19      PT LONG TERM GOAL #2   Title  pt to demo RLE strength to 5/5 in all planes to promote knee stability with standing/ walking and dynamic activities    Time  8    Period  Weeks    Status  New    Target Date  07/31/19      PT LONG TERM GOAL #3   Title  pt to be able to perfor dynamic activities and jogging/ hopping with no report of  pain or instability for personal goal of returning to playing basketball     Time  8    Period  Weeks    Status  New    Target Date  08/21/19      PT LONG TERM GOAL #4   Title  increase FOTO score to </= 33% limited to demo improvement in function    Time  8    Period  Weeks    Status  New    Target Date  08/21/19      PT LONG TERM GOAL #5   Title  pt to be I with all HEP given as of last visit to maintain and progress current level of function independently.     Time  8    Period  Weeks    Status  New    Target  Date  08/21/19             Plan - 06/26/19 1723    Clinical Impression Statement  pt presents to [facility] s/p L knee ACL revision with hamstring autograft on 06/11/2019. he demonstrates limited knee AROM 8 - 92 degrees with swelling noted around the knee. Strength wasn' tassess due to precations. He would benefit from physical therapy to improve knee ROM, increase LLE strength/ stability and return to PLOF by addressing the deficits listed.    Stability/Clinical Decision Making  Stable/Uncomplicated    Clinical Decision Making  Low    Rehab Potential  Excellent    PT Frequency  2x / week    PT Duration  8 weeks    PT Treatment/Interventions  ADLs/Self Care Home Management;Cryotherapy;Electrical Stimulation;Iontophoresis 4mg /ml Dexamethasone;Moist Heat;Ultrasound;Gait training;Stair training;Therapeutic activities;Therapeutic exercise;Balance training;Neuromuscular re-education;Manual techniques;Passive range of motion;Taping;Vasopneumatic Device;Patient/family education    PT Next Visit Plan  review/ update HEP PRN, pt is 2 weeks post op on 06/25/2019, follow protocol, knee ROM, patellar mobs, STW along distal hamstrings, closed kinetic chain strength, hip strength    PT Home Exercise Plan  [id number] - quad set, SLR, hamstring stretch (supine and seated), heel slide with strap,    Consulted and Agree with Plan of Care  Patient       Patient will benefit from skilled  therapeutic intervention in order to improve the following deficits and impairments:  Improper body mechanics, Increased muscle spasms, Decreased strength, Abnormal gait, Pain, Postural dysfunction, Decreased endurance, Decreased activity tolerance, Decreased balance, Increased edema, Decreased range of motion  Visit Diagnosis: Chronic pain of left knee  Localized edema  Stiffness of left knee, not elsewhere classified  Muscle weakness (generalized)     Problem List Patient Active Problem List   Diagnosis Date Noted  . Left varicocele 08/22/2018  . Right Pelvic Subcutaneous Nodule 08/22/2018  . Localized osteoarthritis of left knee 03/02/2018  . Annual physical exam 12/23/2016  . Acute meniscal tear of right knee 02/27/2015  . Right foot pain 01/19/2013    Starr Lake PT, DPT, LAT, ATC  06/26/19  5:32 PM      [facility] [address] [place], [place], [address] Phone: [phone]   Fax:  [phone]  Name: Joshua Gaines MRN: [id number] Date of Birth: 1980/10/05

## 2019-06-29 ENCOUNTER — Ambulatory Visit: Payer: 59 | Admitting: Physical Therapy

## 2019-06-29 ENCOUNTER — Encounter: Payer: Self-pay | Admitting: Physical Therapy

## 2019-06-29 ENCOUNTER — Other Ambulatory Visit: Payer: Self-pay

## 2019-06-29 DIAGNOSIS — G8929 Other chronic pain: Secondary | ICD-10-CM

## 2019-06-29 DIAGNOSIS — M25662 Stiffness of left knee, not elsewhere classified: Secondary | ICD-10-CM

## 2019-06-29 DIAGNOSIS — M25562 Pain in left knee: Secondary | ICD-10-CM | POA: Diagnosis not present

## 2019-06-29 DIAGNOSIS — M6281 Muscle weakness (generalized): Secondary | ICD-10-CM

## 2019-06-29 DIAGNOSIS — R600 Localized edema: Secondary | ICD-10-CM

## 2019-06-29 NOTE — Therapy (Signed)
[place], [place], [address] Phone: [phone]   Fax:  [phone]  Physical Therapy Treatment  Patient Details  Name: Joshua Gaines MRN: [id number] Date of Birth: 1980/08/01 Referring Provider (PT): Clayton Lefort MD   Encounter Date: 06/29/2019  PT End of Session - 06/29/19 1009    Visit Number  2    Number of Visits  17    Date for PT Re-Evaluation  08/21/19    PT Start Time  0923   pt arrived 8 min late   PT Stop Time  1005    PT Time Calculation (min)  42 min    Activity Tolerance  Patient tolerated treatment well    Behavior During Therapy  [facility] for tasks assessed/performed       Past Medical History:  Diagnosis Date  . GERD (gastroesophageal reflux disease)   . PONV (postoperative nausea and vomiting)     Past Surgical History:  Procedure Laterality Date  . ANTERIOR CRUCIATE LIGAMENT REPAIR  [id number]  . CHONDROPLASTY Right 02/01/2018   Procedure: CHONDROPLASTY;  Surgeon: Hiram Gash, MD;  Location: [place];  Service: Orthopedics;  Laterality: Right;  . HARDWARE REMOVAL Right 02/01/2018   Procedure: HARDWARE REMOVAL;  Surgeon: Hiram Gash, MD;  Location: [place];  Service: Orthopedics;  Laterality: Right;  . HARDWARE REMOVAL Left 06/11/2019   Procedure: HARDWARE REMOVAL;  Surgeon: Hiram Gash, MD;  Location: [place];  Service: Orthopedics;  Laterality: Left;  . HERNIA REPAIR  aprox 2010 or 2013   right inguinal   . KNEE ARTHROSCOPY WITH ANTERIOR CRUCIATE LIGAMENT (ACL) REPAIR Left 06/11/2019   Procedure: KNEE ARTHROSCOPY WITH ANTERIOR CRUCIATE LIGAMENT (ACL) REPAIR WITH HAMSTRING AUTOGRAFT;  Surgeon: Hiram Gash, MD;  Location: [place];  Service: Orthopedics;  Laterality: Left;  . KNEE ARTHROSCOPY WITH ANTERIOR CRUCIATE LIGAMENT (ACL) REPAIR WITH HAMSTRING GRAFT Right 02/01/2018   Procedure: RIGHT KNEE ARTHROSCOPY WITH  POSSIBLE REVISION ANTERIOR CRUCIATE LIGAMENT (ACL) REPAIR WITH AUTOGRAFT HAMSTRING;  Surgeon: Hiram Gash, MD;  Location: [place];  Service: Orthopedics;  Laterality: Right;  . KNEE ARTHROSCOPY WITH LATERAL MENISECTOMY Right 02/01/2018   Procedure: RIGHT KNEE ARTHROSCOPY WITH LATERAL MENISECTOMY;  Surgeon: Hiram Gash, MD;  Location: [place];  Service: Orthopedics;  Laterality: Right;  . KNEE ARTHROSCOPY WITH MEDIAL MENISECTOMY Right 02/01/2018   Procedure: RIGHT KNEE ARTHROSCOPY WITH MEDIAL MENISECTOMY, CHONDROPLASTY;  Surgeon: Hiram Gash, MD;  Location: [place];  Service: Orthopedics;  Laterality: Right;  . MENISCUS REPAIR     left knee    There were no vitals filed for this visit.  Subjective Assessment - 06/29/19 0925    Subjective  " I was pretty sore after the last session, but I am having no tthis morning"         [facility] PT Assessment - 06/29/19 0001      AROM   Left Knee Extension  -4    Left Knee Flexion  101                   [facility] Adult PT Treatment/Exercise - 06/29/19 0001      Exercises   Exercises  Knee/Hip      Knee/Hip Exercises: Stretches   Active Hamstring Stretch  2 reps;30 seconds    Quad Stretch  2 reps;30 seconds      Knee/Hip Exercises: Aerobic  Stationary Bike  x 4 min    1/2 revolution x 2 min full revolutions x 2 min     Vasopneumatic   Number Minutes Vasopneumatic   15 minutes    Vasopnuematic Location   Knee    Vasopneumatic Pressure  High    Vasopneumatic Temperature   34      Manual Therapy   Manual Therapy  Joint mobilization    Joint Mobilization  AP grade III tibiofemoral mobs with pt actively flexing knee between reps. grade III patellar mobs in all planes.                PT Short Term Goals - 06/26/19 1728      PT SHORT TERM GOAL #1   Title  pt to be I with inital HEp     Period  Weeks    Status  New    Target Date  07/24/19      PT SHORT TERM GOAL  #2   Title  pt to verbalize and demo techniques to reduce pain and inflammation via RICE and HEP    Time  4    Period  Weeks    Status  New    Target Date  07/24/19      PT SHORT TERM GOAL #3   Title  pt to demo good quad activation and SLR with </= 5 degree quad lag for functional progression    Period  Weeks    Status  New    Target Date  07/24/19      PT SHORT TERM GOAL #4   Title  pt to increase knee flexion to >/= 110 degrees with </= 4/10 pain for therapuetic progression    Time  4    Period  Weeks    Status  New    Target Date  07/24/19        PT Long Term Goals - 06/26/19 1729      PT LONG TERM GOAL #1   Title  increase ROM to </= 2 degrees and >/= 120 degrees with </= 1/10 pain for functional ROM required for efficent gait and ADLs    Baseline  -    Time  8    Period  Weeks    Status  New    Target Date  08/21/19      PT LONG TERM GOAL #2   Title  pt to demo RLE strength to 5/5 in all planes to promote knee stability with standing/ walking and dynamic activities    Time  8    Period  Weeks    Status  New    Target Date  07/31/19      PT LONG TERM GOAL #3   Title  pt to be able to perfor dynamic activities and jogging/ hopping with no report of pain or instability for personal goal of returning to playing basketball     Time  8    Period  Weeks    Status  New    Target Date  08/21/19      PT LONG TERM GOAL #4   Title  increase FOTO score to </= 33% limited to demo improvement in function    Time  8    Period  Weeks    Status  New    Target Date  08/21/19      PT LONG TERM GOAL #5   Title  pt to be I with all HEP given as of  last visit to maintain and progress current level of function independently.     Time  8    Period  Weeks    Status  New    Target Date  08/21/19            Plan - 06/29/19 1011    Clinical Impression Statement  pt arrived late. continued working on knee ROM and with tibiofemoral and patellar mobs. He performed exercises  well with no report of pain but did report stiffness in the knee. He increased knee ROM to 4 - 101 today. continued vaso to reduce pain and swelling end of session.    PT Treatment/Interventions  ADLs/Self Care Home Management;Cryotherapy;Electrical Stimulation;Iontophoresis 4mg /ml Dexamethasone;Moist Heat;Ultrasound;Gait training;Stair training;Therapeutic activities;Therapeutic exercise;Balance training;Neuromuscular re-education;Manual techniques;Passive range of motion;Taping;Vasopneumatic Device;Patient/family education    PT Next Visit Plan  review/ update HEP PRN, pt is 2 weeks post op on 06/25/2019, follow protocol, knee ROM, patellar mobs, STW along distal hamstrings, closed kinetic chain strength, hip strength    PT Home Exercise Plan  [id number] - quad set, SLR, hamstring stretch (supine and seated), heel slide with strap,       Patient will benefit from skilled therapeutic intervention in order to improve the following deficits and impairments:  Improper body mechanics, Increased muscle spasms, Decreased strength, Abnormal gait, Pain, Postural dysfunction, Decreased endurance, Decreased activity tolerance, Decreased balance, Increased edema, Decreased range of motion  Visit Diagnosis: Chronic pain of left knee  Localized edema  Stiffness of left knee, not elsewhere classified  Muscle weakness (generalized)     Problem List Patient Active Problem List   Diagnosis Date Noted  . Left varicocele 08/22/2018  . Right Pelvic Subcutaneous Nodule 08/22/2018  . Localized osteoarthritis of left knee 03/02/2018  . Annual physical exam 12/23/2016  . Acute meniscal tear of right knee 02/27/2015  . Right foot pain 01/19/2013   Starr Lake PT, DPT, LAT, ATC  06/29/19  10:17 AM      [facility] [address] [place], [place], [address] Phone: [phone]   Fax:  [phone]  Name: Joshua Gaines MRN: [id number] Date of  Birth: [birth year]

## 2019-07-03 ENCOUNTER — Encounter: Payer: Self-pay | Admitting: Physical Therapy

## 2019-07-03 ENCOUNTER — Ambulatory Visit: Payer: 59 | Attending: Sports Medicine | Admitting: Physical Therapy

## 2019-07-03 ENCOUNTER — Other Ambulatory Visit: Payer: Self-pay

## 2019-07-03 DIAGNOSIS — G8929 Other chronic pain: Secondary | ICD-10-CM | POA: Insufficient documentation

## 2019-07-03 DIAGNOSIS — M25562 Pain in left knee: Secondary | ICD-10-CM | POA: Insufficient documentation

## 2019-07-03 DIAGNOSIS — M25662 Stiffness of left knee, not elsewhere classified: Secondary | ICD-10-CM | POA: Insufficient documentation

## 2019-07-03 DIAGNOSIS — M6281 Muscle weakness (generalized): Secondary | ICD-10-CM | POA: Insufficient documentation

## 2019-07-03 DIAGNOSIS — R600 Localized edema: Secondary | ICD-10-CM | POA: Diagnosis present

## 2019-07-03 NOTE — Therapy (Signed)
[place], [place], [address] Phone: [phone]   Fax:  [phone]  Physical Therapy Treatment  Patient Details  Name: Joshua Gaines MRN: [id number] Date of Birth: 1980-05-28 Referring Provider (PT): Clayton Lefort MD   Encounter Date: 07/03/2019  PT End of Session - 07/03/19 1427    Visit Number  3    Number of Visits  17    Date for PT Re-Evaluation  08/21/19    PT Start Time  1332    PT Stop Time  1418    PT Time Calculation (min)  46 min    Activity Tolerance  Patient tolerated treatment well    Behavior During Therapy  [facility] for tasks assessed/performed       Past Medical History:  Diagnosis Date  . GERD (gastroesophageal reflux disease)   . PONV (postoperative nausea and vomiting)     Past Surgical History:  Procedure Laterality Date  . ANTERIOR CRUCIATE LIGAMENT REPAIR  [id number]  . CHONDROPLASTY Right 02/01/2018   Procedure: CHONDROPLASTY;  Surgeon: Hiram Gash, MD;  Location: [place];  Service: Orthopedics;  Laterality: Right;  . HARDWARE REMOVAL Right 02/01/2018   Procedure: HARDWARE REMOVAL;  Surgeon: Hiram Gash, MD;  Location: [place];  Service: Orthopedics;  Laterality: Right;  . HARDWARE REMOVAL Left 06/11/2019   Procedure: HARDWARE REMOVAL;  Surgeon: Hiram Gash, MD;  Location: [place];  Service: Orthopedics;  Laterality: Left;  . HERNIA REPAIR  aprox 2010 or 2013   right inguinal   . KNEE ARTHROSCOPY WITH ANTERIOR CRUCIATE LIGAMENT (ACL) REPAIR Left 06/11/2019   Procedure: KNEE ARTHROSCOPY WITH ANTERIOR CRUCIATE LIGAMENT (ACL) REPAIR WITH HAMSTRING AUTOGRAFT;  Surgeon: Hiram Gash, MD;  Location: [place];  Service: Orthopedics;  Laterality: Left;  . KNEE ARTHROSCOPY WITH ANTERIOR CRUCIATE LIGAMENT (ACL) REPAIR WITH HAMSTRING GRAFT Right 02/01/2018   Procedure: RIGHT KNEE ARTHROSCOPY WITH POSSIBLE REVISION  ANTERIOR CRUCIATE LIGAMENT (ACL) REPAIR WITH AUTOGRAFT HAMSTRING;  Surgeon: Hiram Gash, MD;  Location: [place];  Service: Orthopedics;  Laterality: Right;  . KNEE ARTHROSCOPY WITH LATERAL MENISECTOMY Right 02/01/2018   Procedure: RIGHT KNEE ARTHROSCOPY WITH LATERAL MENISECTOMY;  Surgeon: Hiram Gash, MD;  Location: [place];  Service: Orthopedics;  Laterality: Right;  . KNEE ARTHROSCOPY WITH MEDIAL MENISECTOMY Right 02/01/2018   Procedure: RIGHT KNEE ARTHROSCOPY WITH MEDIAL MENISECTOMY, CHONDROPLASTY;  Surgeon: Hiram Gash, MD;  Location: [place];  Service: Orthopedics;  Laterality: Right;  . MENISCUS REPAIR     left knee    There were no vitals filed for this visit.  Subjective Assessment - 07/03/19 1335    Subjective  " I am doing pretty good, mild pain along the distal hamstrings"    Patient Stated Goals  decrease pain, get back into shape, return to playing basketball    Currently in Pain?  Yes    Pain Location  Knee    Pain Orientation  Left    Pain Descriptors / Indicators  Aching    Pain Type  Chronic pain    Pain Onset  More than a month ago         [facility] PT Assessment - 07/03/19 0001      Assessment   Medical Diagnosis  L knee revision ACL reconstruction with Auto graft hamstring and hardware removal    Referring Provider (PT)  Clayton Lefort MD  AROM   Left Knee Flexion  106                   [facility] Adult PT Treatment/Exercise - 07/03/19 0001      Self-Care   Self-Care  Other Self-Care Comments    Other Self-Care Comments   educated on home NMES machine and setting parameters to promote quad strengthening/ activation.       Knee/Hip Exercises: Stretches   Active Hamstring Stretch  2 reps;30 seconds    Quad Stretch  2 reps;30 seconds      Knee/Hip Exercises: Aerobic   Stationary Bike  x 5 min full revolutions      Knee/Hip Exercises: Standing   Heel Raises  2 sets;20 reps    Wall Squat  2  sets;15 reps   going from 0-45 degrees per protocol     Knee/Hip Exercises: Sidelying   Hip ABduction  2 sets;15 reps   CW/CCW x 15     Knee/Hip Exercises: Prone   Hamstring Curl  2 sets;15 reps    Hip Extension  Strengthening;2 sets;15 reps   with knee in flexed position     Vasopneumatic   Number Minutes Vasopneumatic   15 minutes    Vasopnuematic Location   Knee    Vasopneumatic Pressure  High    Vasopneumatic Temperature   34      Manual Therapy   Joint Mobilization  AP grade III tibiofemoral mobs with pt actively flexing knee between reps. grade III patellar mobs in all planes.              PT Education - 07/03/19 1427    Education Details  reviewed HEp and discussed precautions. reviewed NMES techniques on home unit and set parameters to promote muscle activation.    Person(s) Educated  Patient    Methods  Explanation;Verbal cues    Comprehension  Verbalized understanding;Verbal cues required       PT Short Term Goals - 06/26/19 1728      PT SHORT TERM GOAL #1   Title  pt to be I with inital HEp     Period  Weeks    Status  New    Target Date  07/24/19      PT SHORT TERM GOAL #2   Title  pt to verbalize and demo techniques to reduce pain and inflammation via RICE and HEP    Time  4    Period  Weeks    Status  New    Target Date  07/24/19      PT SHORT TERM GOAL #3   Title  pt to demo good quad activation and SLR with </= 5 degree quad lag for functional progression    Period  Weeks    Status  New    Target Date  07/24/19      PT SHORT TERM GOAL #4   Title  pt to increase knee flexion to >/= 110 degrees with </= 4/10 pain for therapuetic progression    Time  4    Period  Weeks    Status  New    Target Date  07/24/19        PT Long Term Goals - 06/26/19 1729      PT LONG TERM GOAL #1   Title  increase ROM to </= 2 degrees and >/= 120 degrees with </= 1/10 pain for functional ROM required for efficent gait and ADLs    Baseline  -  Time  8     Period  Weeks    Status  New    Target Date  08/21/19      PT LONG TERM GOAL #2   Title  pt to demo RLE strength to 5/5 in all planes to promote knee stability with standing/ walking and dynamic activities    Time  8    Period  Weeks    Status  New    Target Date  07/31/19      PT LONG TERM GOAL #3   Title  pt to be able to perfor dynamic activities and jogging/ hopping with no report of pain or instability for personal goal of returning to playing basketball     Time  8    Period  Weeks    Status  New    Target Date  08/21/19      PT LONG TERM GOAL #4   Title  increase FOTO score to </= 33% limited to demo improvement in function    Time  8    Period  Weeks    Status  New    Target Date  08/21/19      PT LONG TERM GOAL #5   Title  pt to be I with all HEP given as of last visit to maintain and progress current level of function independently.     Time  8    Period  Weeks    Status  New    Target Date  08/21/19            Plan - 07/03/19 1428    Clinical Impression Statement  [name] continues to make progress with knee flexion at 106 degrees today. He did well with strengtheing for the hip and knee staying with in restrictions per protocol. reviewed pt's home NMES unit and set parameters to promote muscle activation.    PT Next Visit Plan  review/ update HEP PRN, pt is 3 weeks post op on 07/02/2019, follow protocol, knee ROM, patellar mobs, STW along distal hamstrings, closed kinetic chain strength, hip strength provide handout for wall squat    PT Home Exercise Plan  [id number] - quad set, SLR, hamstring stretch (supine and seated), heel slide with strap,    Consulted and Agree with Plan of Care  Patient       Patient will benefit from skilled therapeutic intervention in order to improve the following deficits and impairments:     Visit Diagnosis: Chronic pain of left knee  Localized edema  Stiffness of left knee, not elsewhere classified     Problem  List Patient Active Problem List   Diagnosis Date Noted  . Left varicocele 08/22/2018  . Right Pelvic Subcutaneous Nodule 08/22/2018  . Localized osteoarthritis of left knee 03/02/2018  . Annual physical exam 12/23/2016  . Acute meniscal tear of right knee 02/27/2015  . Right foot pain 01/19/2013   Starr Lake PT, DPT, LAT, ATC  07/03/19  2:30 PM      [facility] [address] [place], [place], [address] Phone: [phone]   Fax:  [phone]  Name: SALATIEL KASPERSKI MRN: [id number] Date of Birth: 27-May-1980

## 2019-07-05 ENCOUNTER — Ambulatory Visit: Payer: 59 | Admitting: Physical Therapy

## 2019-07-05 ENCOUNTER — Other Ambulatory Visit: Payer: Self-pay

## 2019-07-05 ENCOUNTER — Encounter: Payer: Self-pay | Admitting: Physical Therapy

## 2019-07-05 DIAGNOSIS — R600 Localized edema: Secondary | ICD-10-CM

## 2019-07-05 DIAGNOSIS — M6281 Muscle weakness (generalized): Secondary | ICD-10-CM | POA: Diagnosis not present

## 2019-07-05 DIAGNOSIS — G8929 Other chronic pain: Secondary | ICD-10-CM

## 2019-07-05 DIAGNOSIS — M25662 Stiffness of left knee, not elsewhere classified: Secondary | ICD-10-CM | POA: Diagnosis not present

## 2019-07-05 DIAGNOSIS — M25562 Pain in left knee: Secondary | ICD-10-CM | POA: Diagnosis not present

## 2019-07-05 NOTE — Therapy (Signed)
[place], [place], [address] Phone: [phone]   Fax:  [phone]  Physical Therapy Treatment  Patient Details  Name: Joshua Gaines MRN: [id number] Date of Birth: 07-Sep-1980 Referring Provider (PT): Clayton Lefort MD   Encounter Date: 07/05/2019  PT End of Session - 07/05/19 1331    Visit Number  4    Number of Visits  17    Date for PT Re-Evaluation  08/21/19    PT Start Time  1331    PT Stop Time  1420    PT Time Calculation (min)  49 min    Activity Tolerance  Patient tolerated treatment well    Behavior During Therapy  [facility] for tasks assessed/performed       Past Medical History:  Diagnosis Date  . GERD (gastroesophageal reflux disease)   . PONV (postoperative nausea and vomiting)     Past Surgical History:  Procedure Laterality Date  . ANTERIOR CRUCIATE LIGAMENT REPAIR  [id number]  . CHONDROPLASTY Right 02/01/2018   Procedure: CHONDROPLASTY;  Surgeon: Hiram Gash, MD;  Location: [place];  Service: Orthopedics;  Laterality: Right;  . HARDWARE REMOVAL Right 02/01/2018   Procedure: HARDWARE REMOVAL;  Surgeon: Hiram Gash, MD;  Location: [place];  Service: Orthopedics;  Laterality: Right;  . HARDWARE REMOVAL Left 06/11/2019   Procedure: HARDWARE REMOVAL;  Surgeon: Hiram Gash, MD;  Location: [place];  Service: Orthopedics;  Laterality: Left;  . HERNIA REPAIR  aprox 2010 or 2013   right inguinal   . KNEE ARTHROSCOPY WITH ANTERIOR CRUCIATE LIGAMENT (ACL) REPAIR Left 06/11/2019   Procedure: KNEE ARTHROSCOPY WITH ANTERIOR CRUCIATE LIGAMENT (ACL) REPAIR WITH HAMSTRING AUTOGRAFT;  Surgeon: Hiram Gash, MD;  Location: [place];  Service: Orthopedics;  Laterality: Left;  . KNEE ARTHROSCOPY WITH ANTERIOR CRUCIATE LIGAMENT (ACL) REPAIR WITH HAMSTRING GRAFT Right 02/01/2018   Procedure: RIGHT KNEE ARTHROSCOPY WITH POSSIBLE REVISION  ANTERIOR CRUCIATE LIGAMENT (ACL) REPAIR WITH AUTOGRAFT HAMSTRING;  Surgeon: Hiram Gash, MD;  Location: [place];  Service: Orthopedics;  Laterality: Right;  . KNEE ARTHROSCOPY WITH LATERAL MENISECTOMY Right 02/01/2018   Procedure: RIGHT KNEE ARTHROSCOPY WITH LATERAL MENISECTOMY;  Surgeon: Hiram Gash, MD;  Location: [place];  Service: Orthopedics;  Laterality: Right;  . KNEE ARTHROSCOPY WITH MEDIAL MENISECTOMY Right 02/01/2018   Procedure: RIGHT KNEE ARTHROSCOPY WITH MEDIAL MENISECTOMY, CHONDROPLASTY;  Surgeon: Hiram Gash, MD;  Location: [place];  Service: Orthopedics;  Laterality: Right;  . MENISCUS REPAIR     left knee    There were no vitals filed for this visit.  Subjective Assessment - 07/05/19 1333    Subjective  "I am doing pretty good, some soreness in the knee above the patella and in the back of the hamstring"    Patient Stated Goals  decrease pain, get back into shape, return to playing basketball    Currently in Pain?  Yes    Pain Score  2     Pain Orientation  Left    Pain Descriptors / Indicators  Aching    Pain Type  Chronic pain    Pain Onset  More than a month ago    Pain Frequency  Intermittent    Aggravating Factors   bending the knee quick.         [facility] PT Assessment - 07/05/19 0001      Assessment  Medical Diagnosis  L knee revision ACL reconstruction with Auto graft hamstring and hardware removal    Referring Provider (PT)  Clayton Lefort MD      AROM   Left Knee Extension  -3    Left Knee Flexion  111                   [facility] Adult PT Treatment/Exercise - 07/05/19 1338      Knee/Hip Exercises: Stretches   Active Hamstring Stretch  2 reps;30 seconds    Quad Stretch  2 reps;30 seconds   in prone with strap     Knee/Hip Exercises: Aerobic   Nustep  L7 x 6 min LE only      Knee/Hip Exercises: Standing   Step Down  2 sets;10 reps      Vasopneumatic   Number Minutes Vasopneumatic    10 minutes    Vasopnuematic Location   Knee    Vasopneumatic Pressure  High    Vasopneumatic Temperature   34      Manual Therapy   Manual Therapy  Soft tissue mobilization    Manual therapy comments  MTPR along hamstring x 2, x qud x 3 ea.     Joint Mobilization  AP grade III tibiofemoral mobs with pt actively flexing knee between reps. grade III patellar mobs in all planes.     Soft tissue mobilization  IASTM Along the hamstring and quad along the quad tendon                PT Short Term Goals - 06/26/19 1728      PT SHORT TERM GOAL #1   Title  pt to be I with inital HEp     Period  Weeks    Status  New    Target Date  07/24/19      PT SHORT TERM GOAL #2   Title  pt to verbalize and demo techniques to reduce pain and inflammation via RICE and HEP    Time  4    Period  Weeks    Status  New    Target Date  07/24/19      PT SHORT TERM GOAL #3   Title  pt to demo good quad activation and SLR with </= 5 degree quad lag for functional progression    Period  Weeks    Status  New    Target Date  07/24/19      PT SHORT TERM GOAL #4   Title  pt to increase knee flexion to >/= 110 degrees with </= 4/10 pain for therapuetic progression    Time  4    Period  Weeks    Status  New    Target Date  07/24/19        PT Long Term Goals - 06/26/19 1729      PT LONG TERM GOAL #1   Title  increase ROM to </= 2 degrees and >/= 120 degrees with </= 1/10 pain for functional ROM required for efficent gait and ADLs    Baseline  -    Time  8    Period  Weeks    Status  New    Target Date  08/21/19      PT LONG TERM GOAL #2   Title  pt to demo RLE strength to 5/5 in all planes to promote knee stability with standing/ walking and dynamic activities    Time  8    Period  Weeks    Status  New    Target Date  07/31/19      PT LONG TERM GOAL #3   Title  pt to be able to perfor dynamic activities and jogging/ hopping with no report of pain or instability for personal goal of  returning to playing basketball     Time  8    Period  Weeks    Status  New    Target Date  08/21/19      PT LONG TERM GOAL #4   Title  increase FOTO score to </= 33% limited to demo improvement in function    Time  8    Period  Weeks    Status  New    Target Date  08/21/19      PT LONG TERM GOAL #5   Title  pt to be I with all HEP given as of last visit to maintain and progress current level of function independently.     Time  8    Period  Weeks    Status  New    Target Date  08/21/19            Plan - 07/05/19 1416    Clinical Impression Statement  pt contineus to make progres increase ROM 3 - 111 degrees today. continued working on quad and hamstring tension to promote ROM, and working on CKC strengthening staying within protocol. continued vaso end of session to calm down pain and inflammation.    PT Treatment/Interventions  ADLs/Self Care Home Management;Cryotherapy;Electrical Stimulation;Iontophoresis 4mg /ml Dexamethasone;Moist Heat;Ultrasound;Gait training;Stair training;Therapeutic activities;Therapeutic exercise;Balance training;Neuromuscular re-education;Manual techniques;Passive range of motion;Taping;Vasopneumatic Device;Patient/family education    PT Next Visit Plan  review/ update HEP PRN, pt is 3 weeks post op on 07/02/2019, follow protocol, knee ROM, patellar mobs, STW along distal hamstrings, closed kinetic chain strength, hip strength provide handout for wall squat    PT Home Exercise Plan  [id number] - quad set, SLR, hamstring stretch (supine and seated), heel slide with strap,    Consulted and Agree with Plan of Care  Patient       Patient will benefit from skilled therapeutic intervention in order to improve the following deficits and impairments:  Improper body mechanics, Increased muscle spasms, Decreased strength, Abnormal gait, Pain, Postural dysfunction, Decreased endurance, Decreased activity tolerance, Decreased balance, Increased edema, Decreased range of  motion  Visit Diagnosis: Chronic pain of left knee  Localized edema  Stiffness of left knee, not elsewhere classified  Muscle weakness (generalized)     Problem List Patient Active Problem List   Diagnosis Date Noted  . Left varicocele 08/22/2018  . Right Pelvic Subcutaneous Nodule 08/22/2018  . Localized osteoarthritis of left knee 03/02/2018  . Annual physical exam 12/23/2016  . Acute meniscal tear of right knee 02/27/2015  . Right foot pain 01/19/2013   Starr Lake PT, DPT, LAT, ATC  07/05/19  2:21 PM      [facility] [address] [place], [place], [address] Phone: [phone]   Fax:  [phone]  Name: Joshua Gaines MRN: [id number] Date of Birth: 1981-01-03

## 2019-07-10 ENCOUNTER — Ambulatory Visit: Payer: 59 | Admitting: Physical Therapy

## 2019-07-10 ENCOUNTER — Encounter: Payer: Self-pay | Admitting: Physical Therapy

## 2019-07-10 ENCOUNTER — Other Ambulatory Visit: Payer: Self-pay

## 2019-07-10 DIAGNOSIS — M25662 Stiffness of left knee, not elsewhere classified: Secondary | ICD-10-CM | POA: Diagnosis not present

## 2019-07-10 DIAGNOSIS — G8929 Other chronic pain: Secondary | ICD-10-CM | POA: Diagnosis not present

## 2019-07-10 DIAGNOSIS — R600 Localized edema: Secondary | ICD-10-CM | POA: Diagnosis not present

## 2019-07-10 DIAGNOSIS — S83512D Sprain of anterior cruciate ligament of left knee, subsequent encounter: Secondary | ICD-10-CM | POA: Diagnosis not present

## 2019-07-10 DIAGNOSIS — M25562 Pain in left knee: Secondary | ICD-10-CM | POA: Diagnosis not present

## 2019-07-10 DIAGNOSIS — M6281 Muscle weakness (generalized): Secondary | ICD-10-CM | POA: Diagnosis not present

## 2019-07-10 NOTE — Therapy (Signed)
[place], [place], [address] Phone: [phone]   Fax:  [phone]  Physical Therapy Treatment  Patient Details  Name: Joshua Gaines MRN: [id number] Date of Birth: 1980/06/02 Referring Provider (PT): Clayton Lefort MD   Encounter Date: 07/10/2019  PT End of Session - 07/10/19 1339    Visit Number  5    Number of Visits  17    Date for PT Re-Evaluation  08/21/19    PT Start Time  [id number]   pt arrived 8 min late today   PT Stop Time  1412    PT Time Calculation (min)  34 min    Activity Tolerance  Patient tolerated treatment well    Behavior During Therapy  [employer] [facility] for tasks assessed/performed       Past Medical History:  Diagnosis Date  . GERD (gastroesophageal reflux disease)   . PONV (postoperative nausea and vomiting)     Past Surgical History:  Procedure Laterality Date  . ANTERIOR CRUCIATE LIGAMENT REPAIR  [id number]  . CHONDROPLASTY Right 02/01/2018   Procedure: CHONDROPLASTY;  Surgeon: Hiram Gash, MD;  Location: [place];  Service: Orthopedics;  Laterality: Right;  . HARDWARE REMOVAL Right 02/01/2018   Procedure: HARDWARE REMOVAL;  Surgeon: Hiram Gash, MD;  Location: [place];  Service: Orthopedics;  Laterality: Right;  . HARDWARE REMOVAL Left 06/11/2019   Procedure: HARDWARE REMOVAL;  Surgeon: Hiram Gash, MD;  Location: [place];  Service: Orthopedics;  Laterality: Left;  . HERNIA REPAIR  aprox 2010 or 2013   right inguinal   . KNEE ARTHROSCOPY WITH ANTERIOR CRUCIATE LIGAMENT (ACL) REPAIR Left 06/11/2019   Procedure: KNEE ARTHROSCOPY WITH ANTERIOR CRUCIATE LIGAMENT (ACL) REPAIR WITH HAMSTRING AUTOGRAFT;  Surgeon: Hiram Gash, MD;  Location: [place];  Service: Orthopedics;  Laterality: Left;  . KNEE ARTHROSCOPY WITH ANTERIOR CRUCIATE LIGAMENT (ACL) REPAIR WITH HAMSTRING GRAFT Right 02/01/2018   Procedure: RIGHT KNEE ARTHROSCOPY  WITH POSSIBLE REVISION ANTERIOR CRUCIATE LIGAMENT (ACL) REPAIR WITH AUTOGRAFT HAMSTRING;  Surgeon: Hiram Gash, MD;  Location: [place];  Service: Orthopedics;  Laterality: Right;  . KNEE ARTHROSCOPY WITH LATERAL MENISECTOMY Right 02/01/2018   Procedure: RIGHT KNEE ARTHROSCOPY WITH LATERAL MENISECTOMY;  Surgeon: Hiram Gash, MD;  Location: [place];  Service: Orthopedics;  Laterality: Right;  . KNEE ARTHROSCOPY WITH MEDIAL MENISECTOMY Right 02/01/2018   Procedure: RIGHT KNEE ARTHROSCOPY WITH MEDIAL MENISECTOMY, CHONDROPLASTY;  Surgeon: Hiram Gash, MD;  Location: [place];  Service: Orthopedics;  Laterality: Right;  . MENISCUS REPAIR     left knee    There were no vitals filed for this visit.  Subjective Assessment - 07/10/19 1340    Subjective  " I am doing pretty good, I am 4 weeks ou ttoday and I am now off the brace"    Currently in Pain?  No/denies         [facility] PT Assessment - 07/10/19 0001      AROM   Left Knee Extension  -4    Left Knee Flexion  115                   [facility] Adult PT Treatment/Exercise - 07/10/19 0001      Knee/Hip Exercises: Stretches   Active Hamstring Stretch  2 reps;30 seconds    Quad Stretch  2 reps;30 seconds      Knee/Hip Exercises: Aerobic  Stationary Bike  L2 x 5 min       Knee/Hip Exercises: Standing   Knee Flexion  Left;2 sets;15 reps   3#   Step Down  2 sets;15 reps;Step Height: 4"    SLS with Vectors  4-way hip strenthening 1 x 15 with green theraband..       Knee/Hip Exercises: Supine   Quad Sets  1 set;10 reps;Both   with ball squeeze     Manual Therapy   Manual therapy comments  MTPR along vastus lateralis x 3    Joint Mobilization  AP grade III tibiofemoral mobs and inferior patellar  glides grade III               PT Short Term Goals - 06/26/19 1728      PT SHORT TERM GOAL #1   Title  pt to be I with inital HEp     Period  Weeks    Status  New     Target Date  07/24/19      PT SHORT TERM GOAL #2   Title  pt to verbalize and demo techniques to reduce pain and inflammation via RICE and HEP    Time  4    Period  Weeks    Status  New    Target Date  07/24/19      PT SHORT TERM GOAL #3   Title  pt to demo good quad activation and SLR with </= 5 degree quad lag for functional progression    Period  Weeks    Status  New    Target Date  07/24/19      PT SHORT TERM GOAL #4   Title  pt to increase knee flexion to >/= 110 degrees with </= 4/10 pain for therapuetic progression    Time  4    Period  Weeks    Status  New    Target Date  07/24/19        PT Long Term Goals - 06/26/19 1729      PT LONG TERM GOAL #1   Title  increase ROM to </= 2 degrees and >/= 120 degrees with </= 1/10 pain for functional ROM required for efficent gait and ADLs    Baseline  -    Time  8    Period  Weeks    Status  New    Target Date  08/21/19      PT LONG TERM GOAL #2   Title  pt to demo RLE strength to 5/5 in all planes to promote knee stability with standing/ walking and dynamic activities    Time  8    Period  Weeks    Status  New    Target Date  07/31/19      PT LONG TERM GOAL #3   Title  pt to be able to perfor dynamic activities and jogging/ hopping with no report of pain or instability for personal goal of returning to playing basketball     Time  8    Period  Weeks    Status  New    Target Date  08/21/19      PT LONG TERM GOAL #4   Title  increase FOTO score to </= 33% limited to demo improvement in function    Time  8    Period  Weeks    Status  New    Target Date  08/21/19      PT LONG TERM GOAL #  5   Title  pt to be I with all HEP given as of last visit to maintain and progress current level of function independently.     Time  8    Period  Weeks    Status  New    Target Date  08/21/19            Plan - 07/10/19 1412    Clinical Impression Statement  pt continues to make progress with knee flexion increasing to  115 degrees today he is 4 weeks post op and has discontinued using his brace. continued strengtheing for the hamstring and hip staying within protocol. pt opted to hold off on ice today due to time constraints.    PT Treatment/Interventions  ADLs/Self Care Home Management;Cryotherapy;Electrical Stimulation;Iontophoresis 4mg /ml Dexamethasone;Moist Heat;Ultrasound;Gait training;Stair training;Therapeutic activities;Therapeutic exercise;Balance training;Neuromuscular re-education;Manual techniques;Passive range of motion;Taping;Vasopneumatic Device;Patient/family education    PT Next Visit Plan  review/ update HEP PRN, pt is 4 weeks post op on 07/10/2019, follow protocol, knee ROM, patellar mobs, STW along distal hamstrings, closed kinetic chain strength, hip strength provide handout for wall squat    PT Home Exercise Plan  [id number] - quad set, SLR, hamstring stretch (supine and seated), heel slide with strap,    Consulted and Agree with Plan of Care  Patient       Patient will benefit from skilled therapeutic intervention in order to improve the following deficits and impairments:     Visit Diagnosis: Chronic pain of left knee  Localized edema  Stiffness of left knee, not elsewhere classified     Problem List Patient Active Problem List   Diagnosis Date Noted  . Left varicocele 08/22/2018  . Right Pelvic Subcutaneous Nodule 08/22/2018  . Localized osteoarthritis of left knee 03/02/2018  . Annual physical exam 12/23/2016  . Acute meniscal tear of right knee 02/27/2015  . Right foot pain 01/19/2013   Starr Lake PT, DPT, LAT, ATC  07/10/19  2:16 PM      [facility] [address] [place], [place], [address] Phone: [phone]   Fax:  [phone]  Name: DAJOUR BUFFONE MRN: [id number] Date of Birth: 09-30-1980

## 2019-07-12 ENCOUNTER — Other Ambulatory Visit: Payer: Self-pay

## 2019-07-12 ENCOUNTER — Encounter: Payer: Self-pay | Admitting: Physical Therapy

## 2019-07-12 ENCOUNTER — Ambulatory Visit: Payer: 59 | Admitting: Physical Therapy

## 2019-07-12 DIAGNOSIS — M25562 Pain in left knee: Secondary | ICD-10-CM | POA: Diagnosis not present

## 2019-07-12 DIAGNOSIS — M6281 Muscle weakness (generalized): Secondary | ICD-10-CM | POA: Diagnosis not present

## 2019-07-12 DIAGNOSIS — R600 Localized edema: Secondary | ICD-10-CM | POA: Diagnosis not present

## 2019-07-12 DIAGNOSIS — M25662 Stiffness of left knee, not elsewhere classified: Secondary | ICD-10-CM | POA: Diagnosis not present

## 2019-07-12 DIAGNOSIS — G8929 Other chronic pain: Secondary | ICD-10-CM | POA: Diagnosis not present

## 2019-07-12 NOTE — Therapy (Signed)
[place], [place], [address] Phone: [phone]   Fax:  [phone]  Physical Therapy Treatment  Patient Details  Name: Joshua Gaines MRN: [id number] Date of Birth: 12/27/1980 Referring Provider (PT): Clayton Lefort MD   Encounter Date: 07/12/2019  PT End of Session - 07/12/19 1506    Visit Number  6    Number of Visits  17    Date for PT Re-Evaluation  08/21/19    PT Start Time  1503    PT Stop Time  1544    PT Time Calculation (min)  41 min    Activity Tolerance  Patient tolerated treatment well    Behavior During Therapy  [facility] for tasks assessed/performed       Past Medical History:  Diagnosis Date  . GERD (gastroesophageal reflux disease)   . PONV (postoperative nausea and vomiting)     Past Surgical History:  Procedure Laterality Date  . ANTERIOR CRUCIATE LIGAMENT REPAIR  [id number]  . CHONDROPLASTY Right 02/01/2018   Procedure: CHONDROPLASTY;  Surgeon: Hiram Gash, MD;  Location: [place];  Service: Orthopedics;  Laterality: Right;  . HARDWARE REMOVAL Right 02/01/2018   Procedure: HARDWARE REMOVAL;  Surgeon: Hiram Gash, MD;  Location: [place];  Service: Orthopedics;  Laterality: Right;  . HARDWARE REMOVAL Left 06/11/2019   Procedure: HARDWARE REMOVAL;  Surgeon: Hiram Gash, MD;  Location: [place];  Service: Orthopedics;  Laterality: Left;  . HERNIA REPAIR  aprox 2010 or 2013   right inguinal   . KNEE ARTHROSCOPY WITH ANTERIOR CRUCIATE LIGAMENT (ACL) REPAIR Left 06/11/2019   Procedure: KNEE ARTHROSCOPY WITH ANTERIOR CRUCIATE LIGAMENT (ACL) REPAIR WITH HAMSTRING AUTOGRAFT;  Surgeon: Hiram Gash, MD;  Location: [place];  Service: Orthopedics;  Laterality: Left;  . KNEE ARTHROSCOPY WITH ANTERIOR CRUCIATE LIGAMENT (ACL) REPAIR WITH HAMSTRING GRAFT Right 02/01/2018   Procedure: RIGHT KNEE ARTHROSCOPY WITH POSSIBLE REVISION  ANTERIOR CRUCIATE LIGAMENT (ACL) REPAIR WITH AUTOGRAFT HAMSTRING;  Surgeon: Hiram Gash, MD;  Location: [place];  Service: Orthopedics;  Laterality: Right;  . KNEE ARTHROSCOPY WITH LATERAL MENISECTOMY Right 02/01/2018   Procedure: RIGHT KNEE ARTHROSCOPY WITH LATERAL MENISECTOMY;  Surgeon: Hiram Gash, MD;  Location: [place];  Service: Orthopedics;  Laterality: Right;  . KNEE ARTHROSCOPY WITH MEDIAL MENISECTOMY Right 02/01/2018   Procedure: RIGHT KNEE ARTHROSCOPY WITH MEDIAL MENISECTOMY, CHONDROPLASTY;  Surgeon: Hiram Gash, MD;  Location: [place];  Service: Orthopedics;  Laterality: Right;  . MENISCUS REPAIR     left knee    There were no vitals filed for this visit.  Subjective Assessment - 07/12/19 1506    Subjective  " pt is reporting no but notes he may have a rough day"    Currently in Pain?  No/denies    Pain Score  0-No pain    Pain Orientation  Left    Pain Descriptors / Indicators  Aching    Pain Type  Chronic pain    Pain Onset  More than a month ago    Aggravating Factors   be         [facility] PT Assessment - 07/12/19 1516      Observation/Other Assessments   Focus on Therapeutic Outcomes (FOTO)   47% limited      AROM   Left Knee Flexion  121  [facility] Adult PT Treatment/Exercise - 07/12/19 0001      Knee/Hip Exercises: Stretches   Active Hamstring Stretch  2 reps;30 seconds    Quad Stretch  2 reps;30 seconds      Knee/Hip Exercises: Aerobic   Stationary Bike  L2 x 5 min       Knee/Hip Exercises: Machines for Strengthening   Cybex Leg Press  2 x 15 40#  0-45 degrees  bil LE      Knee/Hip Exercises: Seated   Hamstring Curl  2 sets;15 reps   with green theraband     Knee/Hip Exercises: Supine   Straight Leg Raises  Both;1 set;20 reps   2.5     Knee/Hip Exercises: Sidelying   Hip ABduction  1 set;20 reps;Left   2.5#   Hip ADduction  Left;1 set;20 reps   in L sidelyig      Knee/Hip Exercises: Prone   Hamstring Curl  1 set;20 reps   combined with hp extension              PT Short Term Goals - 06/26/19 1728      PT SHORT TERM GOAL #1   Title  pt to be I with inital HEp     Period  Weeks    Status  New    Target Date  07/24/19      PT SHORT TERM GOAL #2   Title  pt to verbalize and demo techniques to reduce pain and inflammation via RICE and HEP    Time  4    Period  Weeks    Status  New    Target Date  07/24/19      PT SHORT TERM GOAL #3   Title  pt to demo good quad activation and SLR with </= 5 degree quad lag for functional progression    Period  Weeks    Status  New    Target Date  07/24/19      PT SHORT TERM GOAL #4   Title  pt to increase knee flexion to >/= 110 degrees with </= 4/10 pain for therapuetic progression    Time  4    Period  Weeks    Status  New    Target Date  07/24/19        PT Long Term Goals - 06/26/19 1729      PT LONG TERM GOAL #1   Title  increase ROM to </= 2 degrees and >/= 120 degrees with </= 1/10 pain for functional ROM required for efficent gait and ADLs    Baseline  -    Time  8    Period  Weeks    Status  New    Target Date  08/21/19      PT LONG TERM GOAL #2   Title  pt to demo RLE strength to 5/5 in all planes to promote knee stability with standing/ walking and dynamic activities    Time  8    Period  Weeks    Status  New    Target Date  07/31/19      PT LONG TERM GOAL #3   Title  pt to be able to perfor dynamic activities and jogging/ hopping with no report of pain or instability for personal goal of returning to playing basketball     Time  8    Period  Weeks    Status  New    Target Date  08/21/19  PT LONG TERM GOAL #4   Title  increase FOTO score to </= 33% limited to demo improvement in function    Time  8    Period  Weeks    Status  New    Target Date  08/21/19      PT LONG TERM GOAL #5   Title  pt to be I with all HEP given as of last visit to maintain and  progress current level of function independently.     Time  8    Period  Weeks    Status  New    Target Date  08/21/19            Plan - 07/12/19 1530    Clinical Impression Statement  pt continues to make progress increasing flexion to 121 today. continued working on hip/ knee strengthening which he continues to do very well and progress nicely. pt is 4 weeks post of this week. end of session he declined modalities.    PT Treatment/Interventions  ADLs/Self Care Home Management;Cryotherapy;Electrical Stimulation;Iontophoresis 4mg /ml Dexamethasone;Moist Heat;Ultrasound;Gait training;Stair training;Therapeutic activities;Therapeutic exercise;Balance training;Neuromuscular re-education;Manual techniques;Passive range of motion;Taping;Vasopneumatic Device;Patient/family education    PT Next Visit Plan  review/ update HEP PRN, pt is 4 weeks post op on 07/10/2019, follow protocol, knee ROM, patellar mobs, STW along distal hamstrings, closed kinetic chain strength, hip strength provide handout for wall squat    Consulted and Agree with Plan of Care  Patient       Patient will benefit from skilled therapeutic intervention in order to improve the following deficits and impairments:  Improper body mechanics, Increased muscle spasms, Decreased strength, Abnormal gait, Pain, Postural dysfunction, Decreased endurance, Decreased activity tolerance, Decreased balance, Increased edema, Decreased range of motion  Visit Diagnosis: Chronic pain of left knee  Localized edema  Stiffness of left knee, not elsewhere classified     Problem List Patient Active Problem List   Diagnosis Date Noted  . Left varicocele 08/22/2018  . Right Pelvic Subcutaneous Nodule 08/22/2018  . Localized osteoarthritis of left knee 03/02/2018  . Annual physical exam 12/23/2016  . Acute meniscal tear of right knee 02/27/2015  . Right foot pain 01/19/2013    Starr Lake PT, DPT, LAT, ATC  07/12/19  3:48  PM      [facility] [facility] [address] [place], [place], [address] Phone: [phone]   Fax:  [phone]  Name: Joshua Gaines MRN: [id number] Date of Birth: Dec 10, 1980

## 2019-07-17 ENCOUNTER — Ambulatory Visit: Payer: 59 | Admitting: Physical Therapy

## 2019-07-19 ENCOUNTER — Encounter: Payer: Self-pay | Admitting: Physical Therapy

## 2019-07-19 ENCOUNTER — Ambulatory Visit: Payer: 59 | Admitting: Physical Therapy

## 2019-07-19 ENCOUNTER — Other Ambulatory Visit: Payer: Self-pay

## 2019-07-19 DIAGNOSIS — R600 Localized edema: Secondary | ICD-10-CM | POA: Diagnosis not present

## 2019-07-19 DIAGNOSIS — M25662 Stiffness of left knee, not elsewhere classified: Secondary | ICD-10-CM

## 2019-07-19 DIAGNOSIS — M6281 Muscle weakness (generalized): Secondary | ICD-10-CM | POA: Diagnosis not present

## 2019-07-19 DIAGNOSIS — M25562 Pain in left knee: Secondary | ICD-10-CM

## 2019-07-19 DIAGNOSIS — G8929 Other chronic pain: Secondary | ICD-10-CM | POA: Diagnosis not present

## 2019-07-19 NOTE — Therapy (Signed)
[place], [place], [address] Phone: [phone]   Fax:  [phone]  Physical Therapy Treatment  Patient Details  Name: Joshua Gaines MRN: [id number] Date of Birth: 11/24/1980 Referring Provider (PT): Clayton Lefort MD   Encounter Date: 07/19/2019  PT End of Session - 07/19/19 1547    Visit Number  7    Number of Visits  17    Date for PT Re-Evaluation  08/21/19    PT Start Time  1501    PT Stop Time  1552    PT Time Calculation (min)  51 min    Behavior During Therapy  [facility] for tasks assessed/performed       Past Medical History:  Diagnosis Date  . GERD (gastroesophageal reflux disease)   . PONV (postoperative nausea and vomiting)     Past Surgical History:  Procedure Laterality Date  . ANTERIOR CRUCIATE LIGAMENT REPAIR  [id number]  . CHONDROPLASTY Right 02/01/2018   Procedure: CHONDROPLASTY;  Surgeon: Hiram Gash, MD;  Location: [place];  Service: Orthopedics;  Laterality: Right;  . HARDWARE REMOVAL Right 02/01/2018   Procedure: HARDWARE REMOVAL;  Surgeon: Hiram Gash, MD;  Location: [place];  Service: Orthopedics;  Laterality: Right;  . HARDWARE REMOVAL Left 06/11/2019   Procedure: HARDWARE REMOVAL;  Surgeon: Hiram Gash, MD;  Location: [place];  Service: Orthopedics;  Laterality: Left;  . HERNIA REPAIR  aprox 2010 or 2013   right inguinal   . KNEE ARTHROSCOPY WITH ANTERIOR CRUCIATE LIGAMENT (ACL) REPAIR Left 06/11/2019   Procedure: KNEE ARTHROSCOPY WITH ANTERIOR CRUCIATE LIGAMENT (ACL) REPAIR WITH HAMSTRING AUTOGRAFT;  Surgeon: Hiram Gash, MD;  Location: [place];  Service: Orthopedics;  Laterality: Left;  . KNEE ARTHROSCOPY WITH ANTERIOR CRUCIATE LIGAMENT (ACL) REPAIR WITH HAMSTRING GRAFT Right 02/01/2018   Procedure: RIGHT KNEE ARTHROSCOPY WITH POSSIBLE REVISION ANTERIOR CRUCIATE LIGAMENT (ACL) REPAIR WITH AUTOGRAFT  HAMSTRING;  Surgeon: Hiram Gash, MD;  Location: [place];  Service: Orthopedics;  Laterality: Right;  . KNEE ARTHROSCOPY WITH LATERAL MENISECTOMY Right 02/01/2018   Procedure: RIGHT KNEE ARTHROSCOPY WITH LATERAL MENISECTOMY;  Surgeon: Hiram Gash, MD;  Location: [place];  Service: Orthopedics;  Laterality: Right;  . KNEE ARTHROSCOPY WITH MEDIAL MENISECTOMY Right 02/01/2018   Procedure: RIGHT KNEE ARTHROSCOPY WITH MEDIAL MENISECTOMY, CHONDROPLASTY;  Surgeon: Hiram Gash, MD;  Location: [place];  Service: Orthopedics;  Laterality: Right;  . MENISCUS REPAIR     left knee    There were no vitals filed for this visit.  Subjective Assessment - 07/19/19 1505    Subjective  " I am still getting that issue in my knee cap at 90 degrees."    Patient Stated Goals  decrease pain, get back into shape, return to playing basketball    Currently in Pain?  No/denies    Pain Score  0-No pain    Pain Orientation  Left    Pain Type  Chronic pain    Pain Onset  More than a month ago    Pain Frequency  Intermittent         [facility] PT Assessment - 07/19/19 0001      Assessment   Medical Diagnosis  L knee revision ACL reconstruction with Auto graft hamstring and hardware removal    Referring Provider (PT)  Clayton Lefort MD  [facility] Adult PT Treatment/Exercise - 07/19/19 0001      Knee/Hip Exercises: Stretches   Active Hamstring Stretch  2 reps;30 seconds    Quad Stretch  2 reps;30 seconds      Knee/Hip Exercises: Aerobic   Stationary Bike  L3 x 5 min       Knee/Hip Exercises: Standing   Forward Lunges  2 sets;10 reps   touching down onto [place] with LE in the back   Step Down  2 sets;15 reps;Step Height: 6";Left    Other Standing Knee Exercises  lateral band walks 4 x 20 ft with green theraband      Knee/Hip Exercises: Seated   Sit to Sand  2 sets;20 reps;without UE support   with therbaand around the knees      Knee/Hip Exercises: Sidelying   Hip ADduction  2 sets;20 reps;Left;Strengthening   in L sidelying     Vasopneumatic   Number Minutes Vasopneumatic   10 minutes    Vasopnuematic Location   Knee    Vasopneumatic Pressure  High    Vasopneumatic Temperature   34      Manual Therapy   Manual Therapy  Taping    Manual therapy comments  MTPR along vastus lateralis x 3    Joint Mobilization  low load long duration medial patellar glides    McConnell  Lateral > Medial          Balance Exercises - 07/19/19 1721      Balance Exercises: Standing   SLS  Eyes open;3 reps;30 secs          PT Short Term Goals - 06/26/19 1728      PT SHORT TERM GOAL #1   Title  pt to be I with inital HEp     Period  Weeks    Status  New    Target Date  07/24/19      PT SHORT TERM GOAL #2   Title  pt to verbalize and demo techniques to reduce pain and inflammation via RICE and HEP    Time  4    Period  Weeks    Status  New    Target Date  07/24/19      PT SHORT TERM GOAL #3   Title  pt to demo good quad activation and SLR with </= 5 degree quad lag for functional progression    Period  Weeks    Status  New    Target Date  07/24/19      PT SHORT TERM GOAL #4   Title  pt to increase knee flexion to >/= 110 degrees with </= 4/10 pain for therapuetic progression    Time  4    Period  Weeks    Status  New    Target Date  07/24/19        PT Long Term Goals - 06/26/19 1729      PT LONG TERM GOAL #1   Title  increase ROM to </= 2 degrees and >/= 120 degrees with </= 1/10 pain for functional ROM required for efficent gait and ADLs    Baseline  -    Time  8    Period  Weeks    Status  New    Target Date  08/21/19      PT LONG TERM GOAL #2   Title  pt to demo RLE strength to 5/5 in all planes to promote knee stability with standing/ walking and dynamic activities  Time  8    Period  Weeks    Status  New    Target Date  07/31/19      PT LONG TERM GOAL #3   Title  pt to be able to  perfor dynamic activities and jogging/ hopping with no report of pain or instability for personal goal of returning to playing basketball     Time  8    Period  Weeks    Status  New    Target Date  08/21/19      PT LONG TERM GOAL #4   Title  increase FOTO score to </= 33% limited to demo improvement in function    Time  8    Period  Weeks    Status  New    Target Date  08/21/19      PT LONG TERM GOAL #5   Title  pt to be I with all HEP given as of last visit to maintain and progress current level of function independently.     Time  8    Period  Weeks    Status  New    Target Date  08/21/19            Plan - 07/19/19 1714    Clinical Impression Statement  pt continues to report patellofemoral pain with audible creptius noted. trialed McConnel tpaing which provided mild relief. Continued working on hip / knee strengthening which he did well with noting soreness in the patella. continued vaso end of session    PT Treatment/Interventions  ADLs/Self Care Home Management;Cryotherapy;Electrical Stimulation;Iontophoresis 4mg /ml Dexamethasone;Moist Heat;Ultrasound;Gait training;Stair training;Therapeutic activities;Therapeutic exercise;Balance training;Neuromuscular re-education;Manual techniques;Passive range of motion;Taping;Vasopneumatic Device;Patient/family education    PT Next Visit Plan  review/ update HEP PRN, pt is 6 weeks post op on 07/24/2019, follow protocol, knee ROM, patellar mobs, STW along distal hamstrings, closed kinetic chain strength, hip strength provide handout for wall squat    PT Home Exercise Plan  [id number] - quad set, SLR, hamstring stretch (supine and seated), heel slide with strap,    Consulted and Agree with Plan of Care  Patient       Patient will benefit from skilled therapeutic intervention in order to improve the following deficits and impairments:  Improper body mechanics, Increased muscle spasms, Decreased strength, Abnormal gait, Pain, Postural  dysfunction, Decreased endurance, Decreased activity tolerance, Decreased balance, Increased edema, Decreased range of motion  Visit Diagnosis: Chronic pain of left knee  Localized edema  Stiffness of left knee, not elsewhere classified  Muscle weakness (generalized)     Problem List Patient Active Problem List   Diagnosis Date Noted  . Left varicocele 08/22/2018  . Right Pelvic Subcutaneous Nodule 08/22/2018  . Localized osteoarthritis of left knee 03/02/2018  . Annual physical exam 12/23/2016  . Acute meniscal tear of right knee 02/27/2015  . Right foot pain 01/19/2013   Starr Lake PT, DPT, LAT, ATC  07/19/19  5:25 PM      [facility] [address] [place], [place], [address] Phone: [phone]   Fax:  [phone]  Name: Joshua Gaines MRN: [id number] Date of Birth: 02-16-1981

## 2019-07-24 ENCOUNTER — Encounter: Payer: Self-pay | Admitting: Physical Therapy

## 2019-07-24 ENCOUNTER — Ambulatory Visit: Payer: 59 | Admitting: Physical Therapy

## 2019-07-24 ENCOUNTER — Other Ambulatory Visit: Payer: Self-pay

## 2019-07-24 DIAGNOSIS — M25662 Stiffness of left knee, not elsewhere classified: Secondary | ICD-10-CM

## 2019-07-24 DIAGNOSIS — M6281 Muscle weakness (generalized): Secondary | ICD-10-CM | POA: Diagnosis not present

## 2019-07-24 DIAGNOSIS — M25562 Pain in left knee: Secondary | ICD-10-CM | POA: Diagnosis not present

## 2019-07-24 DIAGNOSIS — R600 Localized edema: Secondary | ICD-10-CM | POA: Diagnosis not present

## 2019-07-24 DIAGNOSIS — G8929 Other chronic pain: Secondary | ICD-10-CM

## 2019-07-24 NOTE — Therapy (Signed)
[place], [place], [address] Phone: [phone]   Fax:  [phone]  Physical Therapy Treatment  Patient Details  Name: Joshua Gaines MRN: [id number] Date of Birth: 09/21/1980 Referring Provider (PT): Clayton Lefort MD   Encounter Date: 07/24/2019  PT End of Session - 07/24/19 1500    Visit Number  8    Number of Visits  17    Date for PT Re-Evaluation  08/21/19    PT Start Time  1500    PT Stop Time  1540    PT Time Calculation (min)  40 min    Activity Tolerance  Patient tolerated treatment well    Behavior During Therapy  [facility] for tasks assessed/performed       Past Medical History:  Diagnosis Date  . GERD (gastroesophageal reflux disease)   . PONV (postoperative nausea and vomiting)     Past Surgical History:  Procedure Laterality Date  . ANTERIOR CRUCIATE LIGAMENT REPAIR  [id number]  . CHONDROPLASTY Right 02/01/2018   Procedure: CHONDROPLASTY;  Surgeon: Hiram Gash, MD;  Location: [place];  Service: Orthopedics;  Laterality: Right;  . HARDWARE REMOVAL Right 02/01/2018   Procedure: HARDWARE REMOVAL;  Surgeon: Hiram Gash, MD;  Location: [place];  Service: Orthopedics;  Laterality: Right;  . HARDWARE REMOVAL Left 06/11/2019   Procedure: HARDWARE REMOVAL;  Surgeon: Hiram Gash, MD;  Location: [place];  Service: Orthopedics;  Laterality: Left;  . HERNIA REPAIR  aprox 2010 or 2013   right inguinal   . KNEE ARTHROSCOPY WITH ANTERIOR CRUCIATE LIGAMENT (ACL) REPAIR Left 06/11/2019   Procedure: KNEE ARTHROSCOPY WITH ANTERIOR CRUCIATE LIGAMENT (ACL) REPAIR WITH HAMSTRING AUTOGRAFT;  Surgeon: Hiram Gash, MD;  Location: [place];  Service: Orthopedics;  Laterality: Left;  . KNEE ARTHROSCOPY WITH ANTERIOR CRUCIATE LIGAMENT (ACL) REPAIR WITH HAMSTRING GRAFT Right 02/01/2018   Procedure: RIGHT KNEE ARTHROSCOPY WITH POSSIBLE REVISION  ANTERIOR CRUCIATE LIGAMENT (ACL) REPAIR WITH AUTOGRAFT HAMSTRING;  Surgeon: Hiram Gash, MD;  Location: [place];  Service: Orthopedics;  Laterality: Right;  . KNEE ARTHROSCOPY WITH LATERAL MENISECTOMY Right 02/01/2018   Procedure: RIGHT KNEE ARTHROSCOPY WITH LATERAL MENISECTOMY;  Surgeon: Hiram Gash, MD;  Location: [place];  Service: Orthopedics;  Laterality: Right;  . KNEE ARTHROSCOPY WITH MEDIAL MENISECTOMY Right 02/01/2018   Procedure: RIGHT KNEE ARTHROSCOPY WITH MEDIAL MENISECTOMY, CHONDROPLASTY;  Surgeon: Hiram Gash, MD;  Location: [place];  Service: Orthopedics;  Laterality: Right;  . MENISCUS REPAIR     left knee    There were no vitals filed for this visit.  Subjective Assessment - 07/24/19 1501    Subjective  "no pain today, still some issues with the patella"         [facility] PT Assessment - 07/24/19 0001      Assessment   Medical Diagnosis  L knee revision ACL reconstruction with Auto graft hamstring and hardware removal      AROM   Left Knee Extension  -5    Left Knee Flexion  124                   [facility] Adult PT Treatment/Exercise - 07/24/19 0001      Knee/Hip Exercises: Machines for Strengthening   Cybex Leg Press  2 x 20 LLE only staying within 0-90 degrees     Hip Cybex  hip flexion 2 x  15 with 25#      Knee/Hip Exercises: Standing   Step Down  2 sets;15 reps;Step Height: 6";Left      Knee/Hip Exercises: Seated   Sit to Sand  2 sets;15 reps      Knee/Hip Exercises: Supine   Quad Sets  2 sets;10 reps   combined with manual for tibial ER to promote ext   Bridges  20 reps;2 sets   with heels on green physioball for hamstring activition   Single Leg Bridge  2 sets;15 reps;Left;Strengthening    Straight Leg Raises  1 set;20 reps;Left   3#     Knee/Hip Exercises: Sidelying   Hip ABduction  2 sets;20 reps   CW/CCW 3#     Manual Therapy   Manual Therapy  Myofascial release    Manual  therapy comments  MTPR along vastus lateralis x 3    Joint Mobilization  tibial ER with quad set 2 x 10    Soft tissue mobilization  Rolling over vastus latersli               PT Short Term Goals - 06/26/19 1728      PT SHORT TERM GOAL #1   Title  pt to be I with inital HEp     Period  Weeks    Status  New    Target Date  07/24/19      PT SHORT TERM GOAL #2   Title  pt to verbalize and demo techniques to reduce pain and inflammation via RICE and HEP    Time  4    Period  Weeks    Status  New    Target Date  07/24/19      PT SHORT TERM GOAL #3   Title  pt to demo good quad activation and SLR with </= 5 degree quad lag for functional progression    Period  Weeks    Status  New    Target Date  07/24/19      PT SHORT TERM GOAL #4   Title  pt to increase knee flexion to >/= 110 degrees with </= 4/10 pain for therapuetic progression    Time  4    Period  Weeks    Status  New    Target Date  07/24/19        PT Long Term Goals - 06/26/19 1729      PT LONG TERM GOAL #1   Title  increase ROM to </= 2 degrees and >/= 120 degrees with </= 1/10 pain for functional ROM required for efficent gait and ADLs    Baseline  -    Time  8    Period  Weeks    Status  New    Target Date  08/21/19      PT LONG TERM GOAL #2   Title  pt to demo RLE strength to 5/5 in all planes to promote knee stability with standing/ walking and dynamic activities    Time  8    Period  Weeks    Status  New    Target Date  07/31/19      PT LONG TERM GOAL #3   Title  pt to be able to perfor dynamic activities and jogging/ hopping with no report of pain or instability for personal goal of returning to playing basketball     Time  8    Period  Weeks    Status  New    Target  Date  08/21/19      PT LONG TERM GOAL #4   Title  increase FOTO score to </= 33% limited to demo improvement in function    Time  8    Period  Weeks    Status  New    Target Date  08/21/19      PT LONG TERM GOAL #5    Title  pt to be I with all HEP given as of last visit to maintain and progress current level of function independently.     Time  8    Period  Weeks    Status  New    Target Date  08/21/19            Plan - 07/24/19 1543    Clinical Impression Statement  Joshua Gaines continues to make excellent progress with physical therapy increased ROM 5 - 124 degrees. pt is 6 weeks post-op today.  continued working knee mobs to promote extension utilized rotation to promote screwhome mechanism. continued hip/ knee strengthening staying withing protocol limtations.    PT Next Visit Plan  review/ update HEP PRN, pt is 6 weeks post op on 07/24/2019, follow protocol, knee ROM, patellar mobs, STW along distal hamstrings, closed kinetic chain strength, hip strength provide handout for wall squat    PT Home Exercise Plan  [id number] - quad set, SLR, hamstring stretch (supine and seated), heel slide with strap,       Patient will benefit from skilled therapeutic intervention in order to improve the following deficits and impairments:     Visit Diagnosis: Chronic pain of left knee  Localized edema  Stiffness of left knee, not elsewhere classified     Problem List Patient Active Problem List   Diagnosis Date Noted  . Left varicocele 08/22/2018  . Right Pelvic Subcutaneous Nodule 08/22/2018  . Localized osteoarthritis of left knee 03/02/2018  . Annual physical exam 12/23/2016  . Acute meniscal tear of right knee 02/27/2015  . Right foot pain 01/19/2013   Starr Lake PT, DPT, LAT, ATC  07/24/19  3:46 PM      [facility] [address] [place], [place], [address] Phone: [phone]   Fax:  [phone]  Name: Joshua Gaines MRN: [id number] Date of Birth: 05-08-1980

## 2019-07-26 ENCOUNTER — Other Ambulatory Visit: Payer: Self-pay

## 2019-07-26 ENCOUNTER — Ambulatory Visit: Payer: 59 | Admitting: Physical Therapy

## 2019-07-26 DIAGNOSIS — R600 Localized edema: Secondary | ICD-10-CM | POA: Diagnosis not present

## 2019-07-26 DIAGNOSIS — G8929 Other chronic pain: Secondary | ICD-10-CM

## 2019-07-26 DIAGNOSIS — M25662 Stiffness of left knee, not elsewhere classified: Secondary | ICD-10-CM | POA: Diagnosis not present

## 2019-07-26 DIAGNOSIS — M6281 Muscle weakness (generalized): Secondary | ICD-10-CM | POA: Diagnosis not present

## 2019-07-26 DIAGNOSIS — M25562 Pain in left knee: Secondary | ICD-10-CM | POA: Diagnosis not present

## 2019-07-26 NOTE — Therapy (Signed)
[place], [place], [address] Phone: [phone]   Fax:  [phone]  Physical Therapy Treatment  Patient Details  Name: EMARION BABINEAU MRN: [id number] Date of Birth: 02/27/1981 Referring Provider (PT): Clayton Lefort MD   Encounter Date: 07/26/2019  PT End of Session - 07/26/19 1505    Visit Number  9    Number of Visits  17    Date for PT Re-Evaluation  08/21/19    PT Start Time  1503    PT Stop Time  1551    PT Time Calculation (min)  48 min    Activity Tolerance  Patient tolerated treatment well    Behavior During Therapy  [facility] for tasks assessed/performed       Past Medical History:  Diagnosis Date  . GERD (gastroesophageal reflux disease)   . PONV (postoperative nausea and vomiting)     Past Surgical History:  Procedure Laterality Date  . ANTERIOR CRUCIATE LIGAMENT REPAIR  [id number]  . CHONDROPLASTY Right 02/01/2018   Procedure: CHONDROPLASTY;  Surgeon: Hiram Gash, MD;  Location: [place];  Service: Orthopedics;  Laterality: Right;  . HARDWARE REMOVAL Right 02/01/2018   Procedure: HARDWARE REMOVAL;  Surgeon: Hiram Gash, MD;  Location: [place];  Service: Orthopedics;  Laterality: Right;  . HARDWARE REMOVAL Left 06/11/2019   Procedure: HARDWARE REMOVAL;  Surgeon: Hiram Gash, MD;  Location: [place];  Service: Orthopedics;  Laterality: Left;  . HERNIA REPAIR  aprox 2010 or 2013   right inguinal   . KNEE ARTHROSCOPY WITH ANTERIOR CRUCIATE LIGAMENT (ACL) REPAIR Left 06/11/2019   Procedure: KNEE ARTHROSCOPY WITH ANTERIOR CRUCIATE LIGAMENT (ACL) REPAIR WITH HAMSTRING AUTOGRAFT;  Surgeon: Hiram Gash, MD;  Location: [place];  Service: Orthopedics;  Laterality: Left;  . KNEE ARTHROSCOPY WITH ANTERIOR CRUCIATE LIGAMENT (ACL) REPAIR WITH HAMSTRING GRAFT Right 02/01/2018   Procedure: RIGHT KNEE ARTHROSCOPY WITH POSSIBLE REVISION  ANTERIOR CRUCIATE LIGAMENT (ACL) REPAIR WITH AUTOGRAFT HAMSTRING;  Surgeon: Hiram Gash, MD;  Location: [place];  Service: Orthopedics;  Laterality: Right;  . KNEE ARTHROSCOPY WITH LATERAL MENISECTOMY Right 02/01/2018   Procedure: RIGHT KNEE ARTHROSCOPY WITH LATERAL MENISECTOMY;  Surgeon: Hiram Gash, MD;  Location: [place];  Service: Orthopedics;  Laterality: Right;  . KNEE ARTHROSCOPY WITH MEDIAL MENISECTOMY Right 02/01/2018   Procedure: RIGHT KNEE ARTHROSCOPY WITH MEDIAL MENISECTOMY, CHONDROPLASTY;  Surgeon: Hiram Gash, MD;  Location: [place];  Service: Orthopedics;  Laterality: Right;  . MENISCUS REPAIR     left knee    There were no vitals filed for this visit.  Subjective Assessment - 07/26/19 1506    Subjective  " I think the clicking has reduced by atleast 60-70% limited"    Patient Stated Goals  decrease pain, get back into shape, return to playing basketball    Currently in Pain?  No/denies                       [facility] Adult PT Treatment/Exercise - 07/26/19 0001      Knee/Hip Exercises: Stretches   Active Hamstring Stretch  2 reps;30 seconds    Quad Stretch  30 seconds;4 reps;Left   isolating vastus lateralus x 2     Knee/Hip Exercises: Aerobic   Stationary Bike  L4 x 3 min     Nustep  L9 x 3 min LE only  Knee/Hip Exercises: Standing   Heel Raises  Both;2 sets;20 reps;Left    Wall Squat  3 sets    Wall Squat Limitations  wall sit with 15 hip abduction with green theraband, and seated heel raise 15 reps    Other Standing Knee Exercises  resisted walking forward/ backward 1 x 5 ea with 20#      Knee/Hip Exercises: Seated   Stool Scoot - Round Trips  4 x 50 ft bil LE      Vasopneumatic   Number Minutes Vasopneumatic   10 minutes    Vasopnuematic Location   Knee    Vasopneumatic Pressure  High    Vasopneumatic Temperature   34               PT Short Term Goals - 06/26/19 1728       PT SHORT TERM GOAL #1   Title  pt to be I with inital HEp     Period  Weeks    Status  New    Target Date  07/24/19      PT SHORT TERM GOAL #2   Title  pt to verbalize and demo techniques to reduce pain and inflammation via RICE and HEP    Time  4    Period  Weeks    Status  New    Target Date  07/24/19      PT SHORT TERM GOAL #3   Title  pt to demo good quad activation and SLR with </= 5 degree quad lag for functional progression    Period  Weeks    Status  New    Target Date  07/24/19      PT SHORT TERM GOAL #4   Title  pt to increase knee flexion to >/= 110 degrees with </= 4/10 pain for therapuetic progression    Time  4    Period  Weeks    Status  New    Target Date  07/24/19        PT Long Term Goals - 06/26/19 1729      PT LONG TERM GOAL #1   Title  increase ROM to </= 2 degrees and >/= 120 degrees with </= 1/10 pain for functional ROM required for efficent gait and ADLs    Baseline  -    Time  8    Period  Weeks    Status  New    Target Date  08/21/19      PT LONG TERM GOAL #2   Title  pt to demo RLE strength to 5/5 in all planes to promote knee stability with standing/ walking and dynamic activities    Time  8    Period  Weeks    Status  New    Target Date  07/31/19      PT LONG TERM GOAL #3   Title  pt to be able to perfor dynamic activities and jogging/ hopping with no report of pain or instability for personal goal of returning to playing basketball     Time  8    Period  Weeks    Status  New    Target Date  08/21/19      PT LONG TERM GOAL #4   Title  increase FOTO score to </= 33% limited to demo improvement in function    Time  8    Period  Weeks    Status  New    Target Date  08/21/19  PT LONG TERM GOAL #5   Title  pt to be I with all HEP given as of last visit to maintain and progress current level of function independently.     Time  8    Period  Weeks    Status  New    Target Date  08/21/19            Plan - 07/26/19  1543    Clinical Impression Statement  cotninued working on quad and hamstring strengthening. continued CKC per protocol which he did well with. He continues to do well with exercise and reports no increased pain. continued vaso end of session to reduce pain and inflammation.    PT Next Visit Plan  review/ update HEP PRN, pt is 6 weeks post op on 07/24/2019, follow protocol, knee ROM, patellar mobs, STW along distal hamstrings, closed kinetic chain strength, hip strength provide handout for wall squat    PT Home Exercise Plan  [id number] - quad set, SLR, hamstring stretch (supine and seated), heel slide with strap,    Consulted and Agree with Plan of Care  Patient       Patient will benefit from skilled therapeutic intervention in order to improve the following deficits and impairments:  Improper body mechanics, Increased muscle spasms, Decreased strength, Abnormal gait, Pain, Postural dysfunction, Decreased endurance, Decreased activity tolerance, Decreased balance, Increased edema, Decreased range of motion  Visit Diagnosis: Chronic pain of left knee  Localized edema  Stiffness of left knee, not elsewhere classified     Problem List Patient Active Problem List   Diagnosis Date Noted  . Left varicocele 08/22/2018  . Right Pelvic Subcutaneous Nodule 08/22/2018  . Localized osteoarthritis of left knee 03/02/2018  . Annual physical exam 12/23/2016  . Acute meniscal tear of right knee 02/27/2015  . Right foot pain 01/19/2013   Starr Lake PT, DPT, LAT, ATC  07/26/19  3:47 PM      [facility] [address] [place], [place], [address] Phone: [phone]   Fax:  [phone]  Name: MUCAAD SCHIERMAN MRN: [id number] Date of Birth: Sep 04, 1980

## 2019-07-31 ENCOUNTER — Ambulatory Visit: Payer: 59 | Admitting: Physical Therapy

## 2019-07-31 ENCOUNTER — Other Ambulatory Visit: Payer: Self-pay

## 2019-07-31 ENCOUNTER — Encounter: Payer: Self-pay | Admitting: Physical Therapy

## 2019-07-31 DIAGNOSIS — M25662 Stiffness of left knee, not elsewhere classified: Secondary | ICD-10-CM

## 2019-07-31 DIAGNOSIS — M25562 Pain in left knee: Secondary | ICD-10-CM | POA: Diagnosis not present

## 2019-07-31 DIAGNOSIS — M6281 Muscle weakness (generalized): Secondary | ICD-10-CM | POA: Diagnosis not present

## 2019-07-31 DIAGNOSIS — G8929 Other chronic pain: Secondary | ICD-10-CM

## 2019-07-31 DIAGNOSIS — R600 Localized edema: Secondary | ICD-10-CM

## 2019-07-31 NOTE — Therapy (Signed)
[place], [place], [address] Phone: [phone]   Fax:  [phone]  Physical Therapy Treatment  Patient Details  Name: Joshua Gaines MRN: [id number] Date of Birth: [birth year] Referring Provider (PT): Clayton Lefort MD   Encounter Date: 07/31/2019  PT End of Session - 07/31/19 1502    Visit Number  10    Number of Visits  17    Date for PT Re-Evaluation  08/21/19    PT Start Time  1502    PT Stop Time  1540    PT Time Calculation (min)  38 min    Activity Tolerance  Patient tolerated treatment well    Behavior During Therapy  [facility] for tasks assessed/performed       Past Medical History:  Diagnosis Date  . GERD (gastroesophageal reflux disease)   . PONV (postoperative nausea and vomiting)     Past Surgical History:  Procedure Laterality Date  . ANTERIOR CRUCIATE LIGAMENT REPAIR  [id number]  . CHONDROPLASTY Right 02/01/2018   Procedure: CHONDROPLASTY;  Surgeon: Hiram Gash, MD;  Location: [place];  Service: Orthopedics;  Laterality: Right;  . HARDWARE REMOVAL Right 02/01/2018   Procedure: HARDWARE REMOVAL;  Surgeon: Hiram Gash, MD;  Location: [place];  Service: Orthopedics;  Laterality: Right;  . HARDWARE REMOVAL Left 06/11/2019   Procedure: HARDWARE REMOVAL;  Surgeon: Hiram Gash, MD;  Location: [place];  Service: Orthopedics;  Laterality: Left;  . HERNIA REPAIR  aprox 2010 or 2013   right inguinal   . KNEE ARTHROSCOPY WITH ANTERIOR CRUCIATE LIGAMENT (ACL) REPAIR Left 06/11/2019   Procedure: KNEE ARTHROSCOPY WITH ANTERIOR CRUCIATE LIGAMENT (ACL) REPAIR WITH HAMSTRING AUTOGRAFT;  Surgeon: Hiram Gash, MD;  Location: [place];  Service: Orthopedics;  Laterality: Left;  . KNEE ARTHROSCOPY WITH ANTERIOR CRUCIATE LIGAMENT (ACL) REPAIR WITH HAMSTRING GRAFT Right 02/01/2018   Procedure: RIGHT KNEE ARTHROSCOPY WITH POSSIBLE REVISION  ANTERIOR CRUCIATE LIGAMENT (ACL) REPAIR WITH AUTOGRAFT HAMSTRING;  Surgeon: Hiram Gash, MD;  Location: [place];  Service: Orthopedics;  Laterality: Right;  . KNEE ARTHROSCOPY WITH LATERAL MENISECTOMY Right 02/01/2018   Procedure: RIGHT KNEE ARTHROSCOPY WITH LATERAL MENISECTOMY;  Surgeon: Hiram Gash, MD;  Location: [place];  Service: Orthopedics;  Laterality: Right;  . KNEE ARTHROSCOPY WITH MEDIAL MENISECTOMY Right 02/01/2018   Procedure: RIGHT KNEE ARTHROSCOPY WITH MEDIAL MENISECTOMY, CHONDROPLASTY;  Surgeon: Hiram Gash, MD;  Location: [place];  Service: Orthopedics;  Laterality: Right;  . MENISCUS REPAIR     left knee    There were no vitals filed for this visit.  Subjective Assessment - 07/31/19 1504    Subjective  " I still having soreness in the knee cap with getting out of 90 degrees, both otherwise"         [facility] PT Assessment - 07/31/19 0001      Assessment   Medical Diagnosis  L knee revision ACL reconstruction with Auto graft hamstring and hardware removal    Referring Provider (PT)  Clayton Lefort MD                   [facility] Adult PT Treatment/Exercise - 07/31/19 0001      Knee/Hip Exercises: Aerobic   Recumbent Bike  L5 x 5 min      Knee/Hip Exercises: Machines for Strengthening   Cybex Leg Press  2 x 20 40# LLE only  staying within 0-90 degrees     Hip Cybex  hip abduction 25# 1 x 20 bil       Knee/Hip Exercises: Supine   Bridges  2 sets;20 reps   with heels on black foam roller     Manual Therapy   Manual therapy comments  MTPR along vastus lateralis x 3    Joint Mobilization  medial patellar mobs grade III    Soft tissue mobilization  Rolling over vastus latersli          Balance Exercises - 07/31/19 1543      Balance Exercises: Standing   SLS  Eyes open;2 reps   holding black physioball ABC's         PT Short Term Goals - 06/26/19 1728      PT SHORT TERM GOAL #1   Title  pt  to be I with inital HEp     Period  Weeks    Status  New    Target Date  07/24/19      PT SHORT TERM GOAL #2   Title  pt to verbalize and demo techniques to reduce pain and inflammation via RICE and HEP    Time  4    Period  Weeks    Status  New    Target Date  07/24/19      PT SHORT TERM GOAL #3   Title  pt to demo good quad activation and SLR with </= 5 degree quad lag for functional progression    Period  Weeks    Status  New    Target Date  07/24/19      PT SHORT TERM GOAL #4   Title  pt to increase knee flexion to >/= 110 degrees with </= 4/10 pain for therapuetic progression    Time  4    Period  Weeks    Status  New    Target Date  07/24/19        PT Long Term Goals - 06/26/19 1729      PT LONG TERM GOAL #1   Title  increase ROM to </= 2 degrees and >/= 120 degrees with </= 1/10 pain for functional ROM required for efficent gait and ADLs    Baseline  -    Time  8    Period  Weeks    Status  New    Target Date  08/21/19      PT LONG TERM GOAL #2   Title  pt to demo RLE strength to 5/5 in all planes to promote knee stability with standing/ walking and dynamic activities    Time  8    Period  Weeks    Status  New    Target Date  07/31/19      PT LONG TERM GOAL #3   Title  pt to be able to perfor dynamic activities and jogging/ hopping with no report of pain or instability for personal goal of returning to playing basketball     Time  8    Period  Weeks    Status  New    Target Date  08/21/19      PT LONG TERM GOAL #4   Title  increase FOTO score to </= 33% limited to demo improvement in function    Time  8    Period  Weeks    Status  New    Target Date  08/21/19      PT LONG TERM GOAL #  5   Title  pt to be I with all HEP given as of last visit to maintain and progress current level of function independently.     Time  8    Period  Weeks    Status  New    Target Date  08/21/19            Plan - 07/31/19 1540    Clinical Impression Statement   pt continues to report minimal pain under the patella when moving from 90 degrees to 0 with audible creputis noted. worked on STW along vastus lateralis and patellar mobs to promote alignment and hip/ knee strengthening. He continues to do very well, and plan to progress as pt and protocols allows.    PT Treatment/Interventions  ADLs/Self Care Home Management;Cryotherapy;Electrical Stimulation;Iontophoresis 4mg /ml Dexamethasone;Moist Heat;Ultrasound;Gait training;Stair training;Therapeutic activities;Therapeutic exercise;Balance training;Neuromuscular re-education;Manual techniques;Passive range of motion;Taping;Vasopneumatic Device;Patient/family education    PT Next Visit Plan  review/ update HEP PRN, pt is 6 weeks post op on 07/24/2019, follow protocol, knee ROM, patellar mobs, STW along distal hamstrings, closed kinetic chain strength, hip strength provie    PT Home Exercise Plan  [id number] - quad set, SLR, hamstring stretch (supine and seated), heel slide with strap, wall squat    Consulted and Agree with Plan of Care  Patient       Patient will benefit from skilled therapeutic intervention in order to improve the following deficits and impairments:  Improper body mechanics, Increased muscle spasms, Decreased strength, Abnormal gait, Pain, Postural dysfunction, Decreased endurance, Decreased activity tolerance, Decreased balance, Increased edema, Decreased range of motion  Visit Diagnosis: Chronic pain of left knee  Localized edema  Stiffness of left knee, not elsewhere classified  Muscle weakness (generalized)     Problem List Patient Active Problem List   Diagnosis Date Noted  . Left varicocele 08/22/2018  . Right Pelvic Subcutaneous Nodule 08/22/2018  . Localized osteoarthritis of left knee 03/02/2018  . Annual physical exam 12/23/2016  . Acute meniscal tear of right knee 02/27/2015  . Right foot pain 01/19/2013   Starr Lake PT, DPT, LAT, ATC  07/31/19  3:45  PM      [facility] [address] [place], [place], [address] Phone: [phone]   Fax:  [phone]  Name: Joshua Gaines MRN: [id number] Date of Birth: Aug 18, 1980

## 2019-08-02 ENCOUNTER — Ambulatory Visit: Payer: 59 | Attending: Sports Medicine | Admitting: Physical Therapy

## 2019-08-02 ENCOUNTER — Other Ambulatory Visit: Payer: Self-pay

## 2019-08-02 ENCOUNTER — Encounter: Payer: Self-pay | Admitting: Physical Therapy

## 2019-08-02 DIAGNOSIS — R600 Localized edema: Secondary | ICD-10-CM | POA: Diagnosis present

## 2019-08-02 DIAGNOSIS — G8929 Other chronic pain: Secondary | ICD-10-CM | POA: Diagnosis present

## 2019-08-02 DIAGNOSIS — M25562 Pain in left knee: Secondary | ICD-10-CM | POA: Diagnosis present

## 2019-08-02 DIAGNOSIS — M25662 Stiffness of left knee, not elsewhere classified: Secondary | ICD-10-CM | POA: Insufficient documentation

## 2019-08-02 DIAGNOSIS — M6281 Muscle weakness (generalized): Secondary | ICD-10-CM | POA: Diagnosis present

## 2019-08-02 NOTE — Therapy (Signed)
[place], [place], [address] Phone: [phone]   Fax:  [phone]  Physical Therapy Treatment  Patient Details  Name: Joshua Gaines MRN: [id number] Date of Birth: March 23, 1981 Referring Provider (PT): Clayton Lefort MD   Encounter Date: 08/02/2019  PT End of Session - 08/02/19 1503    Visit Number  11    Number of Visits  17    Date for PT Re-Evaluation  08/21/19    PT Start Time  1501    PT Stop Time  1540    PT Time Calculation (min)  39 min    Activity Tolerance  Patient tolerated treatment well       Past Medical History:  Diagnosis Date  . GERD (gastroesophageal reflux disease)   . PONV (postoperative nausea and vomiting)     Past Surgical History:  Procedure Laterality Date  . ANTERIOR CRUCIATE LIGAMENT REPAIR  [id number]  . CHONDROPLASTY Right 02/01/2018   Procedure: CHONDROPLASTY;  Surgeon: Hiram Gash, MD;  Location: [place];  Service: Orthopedics;  Laterality: Right;  . HARDWARE REMOVAL Right 02/01/2018   Procedure: HARDWARE REMOVAL;  Surgeon: Hiram Gash, MD;  Location: [place];  Service: Orthopedics;  Laterality: Right;  . HARDWARE REMOVAL Left 06/11/2019   Procedure: HARDWARE REMOVAL;  Surgeon: Hiram Gash, MD;  Location: [place];  Service: Orthopedics;  Laterality: Left;  . HERNIA REPAIR  aprox 2010 or 2013   right inguinal   . KNEE ARTHROSCOPY WITH ANTERIOR CRUCIATE LIGAMENT (ACL) REPAIR Left 06/11/2019   Procedure: KNEE ARTHROSCOPY WITH ANTERIOR CRUCIATE LIGAMENT (ACL) REPAIR WITH HAMSTRING AUTOGRAFT;  Surgeon: Hiram Gash, MD;  Location: [place];  Service: Orthopedics;  Laterality: Left;  . KNEE ARTHROSCOPY WITH ANTERIOR CRUCIATE LIGAMENT (ACL) REPAIR WITH HAMSTRING GRAFT Right 02/01/2018   Procedure: RIGHT KNEE ARTHROSCOPY WITH POSSIBLE REVISION ANTERIOR CRUCIATE LIGAMENT (ACL) REPAIR WITH AUTOGRAFT HAMSTRING;   Surgeon: Hiram Gash, MD;  Location: [place];  Service: Orthopedics;  Laterality: Right;  . KNEE ARTHROSCOPY WITH LATERAL MENISECTOMY Right 02/01/2018   Procedure: RIGHT KNEE ARTHROSCOPY WITH LATERAL MENISECTOMY;  Surgeon: Hiram Gash, MD;  Location: [place];  Service: Orthopedics;  Laterality: Right;  . KNEE ARTHROSCOPY WITH MEDIAL MENISECTOMY Right 02/01/2018   Procedure: RIGHT KNEE ARTHROSCOPY WITH MEDIAL MENISECTOMY, CHONDROPLASTY;  Surgeon: Hiram Gash, MD;  Location: [place];  Service: Orthopedics;  Laterality: Right;  . MENISCUS REPAIR     left knee    There were no vitals filed for this visit.  Subjective Assessment - 08/02/19 1504    Subjective  "I am doing pretty good today"    Patient Stated Goals  decrease pain, get back into shape, return to playing basketball    Currently in Pain?  Yes    Pain Score  0-No pain    Pain Orientation  Left    Pain Type  Chronic pain    Pain Onset  More than a month ago         [facility] PT Assessment - 08/02/19 0001      Assessment   Medical Diagnosis  L knee revision ACL reconstruction with Auto graft hamstring and hardware removal    Referring Provider (PT)  Clayton Lefort MD                   [facility] Adult PT Treatment/Exercise - 08/02/19 0001  Knee/Hip Exercises: Aerobic   Recumbent Bike  L10 x 3 min     Nustep  L10 x 3 min LE only      Knee/Hip Exercises: Machines for Strengthening   Cybex Knee Flexion  1 x 20 15# LLE only    Cybex Leg Press  2 x 20 40# LLE only staying within 0-90 degrees    bil LE 1 x 20 60#     Knee/Hip Exercises: Standing   Other Standing Knee Exercises  dead lift 2 x 10 8 inch step using 25# kettlebell.    Other Standing Knee Exercises  spanish squat 2 x 12 with ball squeeze for VMO activation      Knee/Hip Exercises: Seated   Other Seated Knee/Hip Exercises  kneeling russian eccentric hamstring curl 2 x 8      Knee/Hip Exercises:  Prone   Hip Extension  Strengthening;2 sets;20 reps   4# oscillating at end range to fatigue     Manual Therapy   Soft tissue mobilization  Rolling over vastus lateralis               PT Short Term Goals - 06/26/19 1728      PT SHORT TERM GOAL #1   Title  pt to be I with inital HEp     Period  Weeks    Status  New    Target Date  07/24/19      PT SHORT TERM GOAL #2   Title  pt to verbalize and demo techniques to reduce pain and inflammation via RICE and HEP    Time  4    Period  Weeks    Status  New    Target Date  07/24/19      PT SHORT TERM GOAL #3   Title  pt to demo good quad activation and SLR with </= 5 degree quad lag for functional progression    Period  Weeks    Status  New    Target Date  07/24/19      PT SHORT TERM GOAL #4   Title  pt to increase knee flexion to >/= 110 degrees with </= 4/10 pain for therapuetic progression    Time  4    Period  Weeks    Status  New    Target Date  07/24/19        PT Long Term Goals - 06/26/19 1729      PT LONG TERM GOAL #1   Title  increase ROM to </= 2 degrees and >/= 120 degrees with </= 1/10 pain for functional ROM required for efficent gait and ADLs    Baseline  -    Time  8    Period  Weeks    Status  New    Target Date  08/21/19      PT LONG TERM GOAL #2   Title  pt to demo RLE strength to 5/5 in all planes to promote knee stability with standing/ walking and dynamic activities    Time  8    Period  Weeks    Status  New    Target Date  07/31/19      PT LONG TERM GOAL #3   Title  pt to be able to perfor dynamic activities and jogging/ hopping with no report of pain or instability for personal goal of returning to playing basketball     Time  8    Period  Weeks    Status  New    Target Date  08/21/19      PT LONG TERM GOAL #4   Title  increase FOTO score to </= 33% limited to demo improvement in function    Time  8    Period  Weeks    Status  New    Target Date  08/21/19      PT LONG TERM  GOAL #5   Title  pt to be I with all HEP given as of last visit to maintain and progress current level of function independently.     Time  8    Period  Weeks    Status  New    Target Date  08/21/19            Plan - 08/02/19 1544    Clinical Impression Statement  pt reports continued improvement in pain and ROM. He does report issues with patellofemoral aggrivation with squating but is able to complete exercise. continued working on VL and and VMO activaiton with CKC positioning. he did well with session noting decreased aggrivation in the knee, and declined modalities end of session.    PT Treatment/Interventions  ADLs/Self Care Home Management;Cryotherapy;Electrical Stimulation;Iontophoresis 4mg /ml Dexamethasone;Moist Heat;Ultrasound;Gait training;Stair training;Therapeutic activities;Therapeutic exercise;Balance training;Neuromuscular re-education;Manual techniques;Passive range of motion;Taping;Vasopneumatic Device;Patient/family education    PT Next Visit Plan  review/ update HEP PRN, pt is 6 weeks post op on 07/24/2019, follow protocol, knee ROM, patellar mobs, STW along distal hamstrings, closed kinetic chain strength, hip strength prone, spanish squat    PT Home Exercise Plan  [id number] - quad set, SLR, hamstring stretch (supine and seated), heel slide with strap, wall squat    Consulted and Agree with Plan of Care  Patient       Patient will benefit from skilled therapeutic intervention in order to improve the following deficits and impairments:  Improper body mechanics, Increased muscle spasms, Decreased strength, Abnormal gait, Pain, Postural dysfunction, Decreased endurance, Decreased activity tolerance, Decreased balance, Increased edema, Decreased range of motion  Visit Diagnosis: Chronic pain of left knee  Localized edema  Stiffness of left knee, not elsewhere classified  Muscle weakness (generalized)     Problem List Patient Active Problem List   Diagnosis Date  Noted  . Left varicocele 08/22/2018  . Right Pelvic Subcutaneous Nodule 08/22/2018  . Localized osteoarthritis of left knee 03/02/2018  . Annual physical exam 12/23/2016  . Acute meniscal tear of right knee 02/27/2015  . Right foot pain 01/19/2013   Starr Lake PT, DPT, LAT, ATC  08/02/19  3:48 PM      [facility] [address] [place], [place], [address] Phone: [phone]   Fax:  [phone]  Name: Joshua Gaines MRN: [id number] Date of Birth: [birth year]

## 2019-08-07 ENCOUNTER — Ambulatory Visit: Payer: 59 | Admitting: Physical Therapy

## 2019-08-07 ENCOUNTER — Other Ambulatory Visit: Payer: Self-pay

## 2019-08-07 ENCOUNTER — Encounter: Payer: Self-pay | Admitting: Physical Therapy

## 2019-08-07 DIAGNOSIS — M25562 Pain in left knee: Secondary | ICD-10-CM | POA: Diagnosis not present

## 2019-08-07 DIAGNOSIS — G8929 Other chronic pain: Secondary | ICD-10-CM

## 2019-08-07 DIAGNOSIS — R600 Localized edema: Secondary | ICD-10-CM

## 2019-08-07 DIAGNOSIS — M25662 Stiffness of left knee, not elsewhere classified: Secondary | ICD-10-CM

## 2019-08-07 DIAGNOSIS — M6281 Muscle weakness (generalized): Secondary | ICD-10-CM

## 2019-08-07 NOTE — Therapy (Signed)
[place], [place], [address] Phone: [phone]   Fax:  [phone]  Physical Therapy Treatment  Patient Details  Name: Joshua Gaines MRN: [id number] Date of Birth: November 24, 1980 Referring Provider (PT): Clayton Lefort MD   Encounter Date: 08/07/2019  PT End of Session - 08/07/19 1507    Visit Number  12    Number of Visits  17    Date for PT Re-Evaluation  08/21/19    PT Start Time  1504    PT Stop Time  [id number]    PT Time Calculation (min)  44 min    Activity Tolerance  Patient tolerated treatment well    Behavior During Therapy  [facility] for tasks assessed/performed       Past Medical History:  Diagnosis Date  . GERD (gastroesophageal reflux disease)   . PONV (postoperative nausea and vomiting)     Past Surgical History:  Procedure Laterality Date  . ANTERIOR CRUCIATE LIGAMENT REPAIR  [id number]  . CHONDROPLASTY Right 02/01/2018   Procedure: CHONDROPLASTY;  Surgeon: Hiram Gash, MD;  Location: [place];  Service: Orthopedics;  Laterality: Right;  . HARDWARE REMOVAL Right 02/01/2018   Procedure: HARDWARE REMOVAL;  Surgeon: Hiram Gash, MD;  Location: [place];  Service: Orthopedics;  Laterality: Right;  . HARDWARE REMOVAL Left 06/11/2019   Procedure: HARDWARE REMOVAL;  Surgeon: Hiram Gash, MD;  Location: [place];  Service: Orthopedics;  Laterality: Left;  . HERNIA REPAIR  aprox 2010 or 2013   right inguinal   . KNEE ARTHROSCOPY WITH ANTERIOR CRUCIATE LIGAMENT (ACL) REPAIR Left 06/11/2019   Procedure: KNEE ARTHROSCOPY WITH ANTERIOR CRUCIATE LIGAMENT (ACL) REPAIR WITH HAMSTRING AUTOGRAFT;  Surgeon: Hiram Gash, MD;  Location: [place];  Service: Orthopedics;  Laterality: Left;  . KNEE ARTHROSCOPY WITH ANTERIOR CRUCIATE LIGAMENT (ACL) REPAIR WITH HAMSTRING GRAFT Right 02/01/2018   Procedure: RIGHT KNEE ARTHROSCOPY WITH POSSIBLE REVISION  ANTERIOR CRUCIATE LIGAMENT (ACL) REPAIR WITH AUTOGRAFT HAMSTRING;  Surgeon: Hiram Gash, MD;  Location: [place];  Service: Orthopedics;  Laterality: Right;  . KNEE ARTHROSCOPY WITH LATERAL MENISECTOMY Right 02/01/2018   Procedure: RIGHT KNEE ARTHROSCOPY WITH LATERAL MENISECTOMY;  Surgeon: Hiram Gash, MD;  Location: [place];  Service: Orthopedics;  Laterality: Right;  . KNEE ARTHROSCOPY WITH MEDIAL MENISECTOMY Right 02/01/2018   Procedure: RIGHT KNEE ARTHROSCOPY WITH MEDIAL MENISECTOMY, CHONDROPLASTY;  Surgeon: Hiram Gash, MD;  Location: [place];  Service: Orthopedics;  Laterality: Right;  . MENISCUS REPAIR     left knee    There were no vitals filed for this visit.  Subjective Assessment - 08/07/19 1507    Subjective  " I did exercises at home and been having still alittle soreness in the knee cap"    Patient Stated Goals  decrease pain, get back into shape, return to playing basketball    Currently in Pain?  Yes    Pain Score  0-No pain    Pain Location  Knee    Pain Orientation  Left    Pain Descriptors / Indicators  Aching    Pain Type  Chronic pain    Pain Onset  More than a month ago    Pain Frequency  Intermittent    Aggravating Factors   deep knee bending         [facility] PT Assessment - 08/07/19 0001      Assessment  Medical Diagnosis  L knee revision ACL reconstruction with Auto graft hamstring and hardware removal    Referring Provider (PT)  Clayton Lefort MD      AROM   Left Knee Flexion  132                   [facility] Adult PT Treatment/Exercise - 08/07/19 0001      Knee/Hip Exercises: Stretches   Active Hamstring Stretch  2 reps;30 seconds    Quad Stretch  30 seconds;4 reps;Left   with strap     Knee/Hip Exercises: Aerobic   Recumbent Bike  L6 x 5 min      Knee/Hip Exercises: Standing   Forward Lunges  2 sets;10 reps   touching down on to Bosu   Functional Squat  2 sets;10 reps   on reverse  Bosu   Other Standing Knee Exercises  TRX hip hinge 2 x 10, and pistol squat 2 x 10 (staying within protocol limitations)    Other Standing Knee Exercises  spanish squat 2 x 12       Knee/Hip Exercises: Seated   Other Seated Knee/Hip Exercises  kneeling russian eccentric hamstring curl 2 x 10      Manual Therapy   Manual therapy comments  MTPR along vL x 4    Soft tissue mobilization  DTM along the VL               PT Short Term Goals - 08/07/19 1601      PT SHORT TERM GOAL #1   Title  pt to be I with inital HEp     Period  Weeks    Status  Achieved      PT SHORT TERM GOAL #2   Title  pt to verbalize and demo techniques to reduce pain and inflammation via RICE and HEP    Period  Weeks    Status  Achieved      PT SHORT TERM GOAL #3   Title  pt to demo good quad activation and SLR with </= 5 degree quad lag for functional progression    Period  Weeks    Status  Achieved      PT SHORT TERM GOAL #4   Title  pt to increase knee flexion to >/= 110 degrees with </= 4/10 pain for therapuetic progression    Period  Weeks    Status  Achieved        PT Long Term Goals - 08/07/19 1602      PT LONG TERM GOAL #1   Title  increase ROM to </= 2 degrees and >/= 120 degrees with </= 1/10 pain for functional ROM required for efficent gait and ADLs    Period  Weeks    Status  Achieved      PT LONG TERM GOAL #2   Title  pt to demo RLE strength to 5/5 in all planes to promote knee stability with standing/ walking and dynamic activities    Period  Weeks    Status  On-going      PT LONG TERM GOAL #3   Title  pt to be able to perfor dynamic activities and jogging/ hopping with no report of pain or instability for personal goal of returning to playing basketball     Period  Weeks    Status  On-going      PT LONG TERM GOAL #4   Title  increase FOTO score to </= 33% limited to  demo improvement in function    Period  Weeks    Status  Unable to assess      PT LONG TERM GOAL #5    Title  pt to be I with all HEP given as of last visit to maintain and progress current level of function independently.     Period  Weeks    Status  On-going            Plan - 08/07/19 1558    Clinical Impression Statement  continued working on patellofemoral relief with STW along the VL which he continues to respond well to. pt is 8 weeks as of 4/5 and is continues to progress appropriately. He did well with strengthening in standing and continue to progress as protocol allows.    PT Treatment/Interventions  ADLs/Self Care Home Management;Cryotherapy;Electrical Stimulation;Iontophoresis 4mg /ml Dexamethasone;Moist Heat;Ultrasound;Gait training;Stair training;Therapeutic activities;Therapeutic exercise;Balance training;Neuromuscular re-education;Manual techniques;Passive range of motion;Taping;Vasopneumatic Device;Patient/family education    PT Next Visit Plan  review/ update HEP PRN, pt is 8 weeks post op on 4/52021, follow protocol, knee ROM, patellar mobs, STW along distal hamstrings, closed kinetic chain strength, hip strength prone, spanish squat, balance training, TRX strengthening    PT Home Exercise Plan  [id number] - quad set, SLR, hamstring stretch (supine and seated), heel slide with strap, wall squat    Consulted and Agree with Plan of Care  Patient       Patient will benefit from skilled therapeutic intervention in order to improve the following deficits and impairments:  Improper body mechanics, Increased muscle spasms, Decreased strength, Abnormal gait, Pain, Postural dysfunction, Decreased endurance, Decreased activity tolerance, Decreased balance, Increased edema, Decreased range of motion  Visit Diagnosis: Chronic pain of left knee  Localized edema  Stiffness of left knee, not elsewhere classified  Muscle weakness (generalized)     Problem List Patient Active Problem List   Diagnosis Date Noted  . Left varicocele 08/22/2018  . Right Pelvic Subcutaneous Nodule  08/22/2018  . Localized osteoarthritis of left knee 03/02/2018  . Annual physical exam 12/23/2016  . Acute meniscal tear of right knee 02/27/2015  . Right foot pain 01/19/2013   Starr Lake PT, DPT, LAT, ATC  08/07/19  4:03 PM      [facility] [address] [place], [place], [address] Phone: [phone]   Fax:  [phone]  Name: JAZIAH ROSCHE MRN: [id number] Date of Birth: 01/29/1981

## 2019-08-09 ENCOUNTER — Encounter: Payer: 59 | Admitting: Physical Therapy

## 2019-08-14 ENCOUNTER — Other Ambulatory Visit: Payer: Self-pay

## 2019-08-14 ENCOUNTER — Ambulatory Visit: Payer: 59 | Admitting: Physical Therapy

## 2019-08-14 DIAGNOSIS — M6281 Muscle weakness (generalized): Secondary | ICD-10-CM | POA: Diagnosis not present

## 2019-08-14 DIAGNOSIS — M25562 Pain in left knee: Secondary | ICD-10-CM

## 2019-08-14 DIAGNOSIS — M25662 Stiffness of left knee, not elsewhere classified: Secondary | ICD-10-CM

## 2019-08-14 DIAGNOSIS — R600 Localized edema: Secondary | ICD-10-CM

## 2019-08-14 DIAGNOSIS — G8929 Other chronic pain: Secondary | ICD-10-CM | POA: Diagnosis not present

## 2019-08-14 NOTE — Therapy (Signed)
[place], [place], [address] Phone: [phone]   Fax:  [phone]  Physical Therapy Treatment / re-certification  Patient Details  Name: Joshua Gaines Date of Birth: 12-25-1980 Referring Provider (PT): Clayton Lefort MD   Encounter Date: 08/14/2019  PT End of Session - 08/14/19 1459    Visit Number  13    Number of Visits  25    Date for PT Re-Evaluation  10/09/19    PT Start Time  5732    PT Stop Time  1544    PT Time Calculation (min)  45 min    Activity Tolerance  Patient tolerated treatment well    Behavior During Therapy  [facility] for tasks assessed/performed       Past Medical History:  Diagnosis Date  . GERD (gastroesophageal reflux disease)   . PONV (postoperative nausea and vomiting)     Past Surgical History:  Procedure Laterality Date  . ANTERIOR CRUCIATE LIGAMENT REPAIR  [id number]  . CHONDROPLASTY Right 02/01/2018   Procedure: CHONDROPLASTY;  Surgeon: Hiram Gash, MD;  Location: [place];  Service: Orthopedics;  Laterality: Right;  . HARDWARE REMOVAL Right 02/01/2018   Procedure: HARDWARE REMOVAL;  Surgeon: Hiram Gash, MD;  Location: [place];  Service: Orthopedics;  Laterality: Right;  . HARDWARE REMOVAL Left 06/11/2019   Procedure: HARDWARE REMOVAL;  Surgeon: Hiram Gash, MD;  Location: [place];  Service: Orthopedics;  Laterality: Left;  . HERNIA REPAIR  aprox 2010 or 2013   right inguinal   . KNEE ARTHROSCOPY WITH ANTERIOR CRUCIATE LIGAMENT (ACL) REPAIR Left 06/11/2019   Procedure: KNEE ARTHROSCOPY WITH ANTERIOR CRUCIATE LIGAMENT (ACL) REPAIR WITH HAMSTRING AUTOGRAFT;  Surgeon: Hiram Gash, MD;  Location: [place];  Service: Orthopedics;  Laterality: Left;  . KNEE ARTHROSCOPY WITH ANTERIOR CRUCIATE LIGAMENT (ACL) REPAIR WITH HAMSTRING GRAFT Right 02/01/2018   Procedure: RIGHT KNEE ARTHROSCOPY WITH  POSSIBLE REVISION ANTERIOR CRUCIATE LIGAMENT (ACL) REPAIR WITH AUTOGRAFT HAMSTRING;  Surgeon: Hiram Gash, MD;  Location: [place];  Service: Orthopedics;  Laterality: Right;  . KNEE ARTHROSCOPY WITH LATERAL MENISECTOMY Right 02/01/2018   Procedure: RIGHT KNEE ARTHROSCOPY WITH LATERAL MENISECTOMY;  Surgeon: Hiram Gash, MD;  Location: [place];  Service: Orthopedics;  Laterality: Right;  . KNEE ARTHROSCOPY WITH MEDIAL MENISECTOMY Right 02/01/2018   Procedure: RIGHT KNEE ARTHROSCOPY WITH MEDIAL MENISECTOMY, CHONDROPLASTY;  Surgeon: Hiram Gash, MD;  Location: [place];  Service: Orthopedics;  Laterality: Right;  . MENISCUS REPAIR     left knee    There were no vitals filed for this visit.  Subjective Assessment - 08/14/19 1500    Subjective  "no issues or problems"    Patient Stated Goals  decrease pain, get back into shape, return to playing basketball    Currently in Pain?  No/denies    Pain Orientation  Left    Aggravating Factors   deep bending b         [facility] PT Assessment - 08/14/19 0001      Assessment   Medical Diagnosis  L knee revision ACL reconstruction with Auto graft hamstring and hardware removal    Referring Provider (PT)  Clayton Lefort MD      Observation/Other Assessments   Focus on Therapeutic Outcomes (FOTO)   21% limited      AROM   Left Knee Extension  2  Left Knee Flexion  127                   [facility] Adult PT Treatment/Exercise - 08/14/19 0001      Knee/Hip Exercises: Aerobic   Recumbent Bike  L5 x 5 min      Knee/Hip Exercises: Standing   Other Standing Knee Exercises  dead lift with black theraband 1 x 15, SLS hip hping with black theraband for resistance 1 x 15    Other Standing Knee Exercises  spanish squat 2 x 15  with ball squeeze      Manual Therapy   Manual therapy comments  skilled palpation and monitoring of pt throughout TPDN    Soft tissue mobilization  IASTM along VL         Trigger Point Dry Needling - 08/14/19 0001    Consent Given?  Yes    Education Handout Provided  Previously provided    Muscles Treated Lower Quadrant  Vastus lateralis    Vastus lateralis Response  Twitch response elicited;Palpable increased muscle length   x 4          PT Education - 08/14/19 1549    Education Details  Reviewed and updated HEP. Discussed pt funtion and progress since initial assessment. Reviewed TPDN and benefits, educated on muscle anatomy.    Person(s) Educated  Patient    Methods  Explanation;Verbal cues    Comprehension  Verbalized understanding;Verbal cues required       PT Short Term Goals - 08/07/19 1601      PT SHORT TERM GOAL #1   Title  pt to be I with inital HEp     Period  Weeks    Status  Achieved      PT SHORT TERM GOAL #2   Title  pt to verbalize and demo techniques to reduce pain and inflammation via RICE and HEP    Period  Weeks    Status  Achieved      PT SHORT TERM GOAL #3   Title  pt to demo good quad activation and SLR with </= 5 degree quad lag for functional progression    Period  Weeks    Status  Achieved      PT SHORT TERM GOAL #4   Title  pt to increase knee flexion to >/= 110 degrees with </= 4/10 pain for therapuetic progression    Period  Weeks    Status  Achieved        PT Long Term Goals - 08/14/19 1550      PT LONG TERM GOAL #1   Title  increase ROM to </= 2 degrees and >/= 120 degrees with </= 1/10 pain for functional ROM required for efficent gait and ADLs    Baseline  3 - 127 degrees today with no pain noted    Time  8    Period  Weeks    Status  Partially Met      PT LONG TERM GOAL #2   Title  pt to demo RLE strength to 5/5 in all planes to promote knee stability with standing/ walking and dynamic activities    Baseline  unable ot assess quad strength due to precautions    Period  Weeks    Status  On-going      PT LONG TERM GOAL #3   Title  pt to be able to perfor dynamic activities and jogging/  hopping with no report of pain or instability for  personal goal of returning to playing basketball     Period  Weeks    Status  On-going      PT LONG TERM GOAL #4   Title  increase FOTO score to </= 33% limited to demo improvement in function    Period  Weeks    Status  Achieved      PT LONG TERM GOAL #5   Title  pt to be I with all HEP given as of last visit to maintain and progress current level of function independently.     Period  Weeks    Status  On-going            Plan - 08/14/19 1552    Clinical Impression Statement  Joshua Gaines continues to make excellent progress with physical therapy increasing knee ROM 3-127 today. he continus to progress approprialty toward his goals. He is 9 weeks post op on 08/13/2019. educated and pt provided consent for TPDN focusing on vastus lateralis followed with continued CKC quad strengthening. he met all STG;s and met LTG #4. he would benefit from continued physical therapy to increase hip / knee strength, ROM, working into dynamic activies as protocol allows and return to PLOF.    Rehab Potential  Good    PT Frequency  2x / week    PT Duration  8 weeks    PT Treatment/Interventions  ADLs/Self Care Home Management;Cryotherapy;Electrical Stimulation;Iontophoresis 71m/ml Dexamethasone;Moist Heat;Ultrasound;Gait training;Stair training;Therapeutic activities;Therapeutic exercise;Balance training;Neuromuscular re-education;Manual techniques;Passive range of motion;Taping;Vasopneumatic Device;Patient/family education;Dry needling    PT Next Visit Plan  review/ update HEP PRN, pt is 9 weeks post op on 08/13/2019, follow protocol, knee ROM, patellar mobs, STW along distal hamstrings, closed kinetic chain strength, hip strength prone, spanish squat, balance training, TRX strengthening    PT Home Exercise Plan  [id number] - quad set, SLR, hamstring stretch (supine and seated), heel slide with strap, wall squat, dead lift    Consulted and Agree with Plan of Care   Patient       Patient will benefit from skilled therapeutic intervention in order to improve the following deficits and impairments:  Improper body mechanics, Increased muscle spasms, Decreased strength, Abnormal gait, Pain, Postural dysfunction, Decreased endurance, Decreased activity tolerance, Decreased balance, Increased edema, Decreased range of motion  Visit Diagnosis: Chronic pain of left knee  Localized edema  Stiffness of left knee, not elsewhere classified     Problem List Patient Active Problem List   Diagnosis Date Noted  . Left varicocele 08/22/2018  . Right Pelvic Subcutaneous Nodule 08/22/2018  . Localized osteoarthritis of left knee 03/02/2018  . Annual physical exam 12/23/2016  . Acute meniscal tear of right knee 02/27/2015  . Right foot pain 01/19/2013   KStarr LakePT, DPT, LAT, ATC  08/14/19  4:00 PM      [facility]GMon[place] [address]Phone: [phone]  Fax:  [phone] Name: Joshua HAENMRN: [id number] of Birth: [birth year]

## 2019-08-16 ENCOUNTER — Ambulatory Visit: Payer: 59 | Admitting: Physical Therapy

## 2019-08-16 ENCOUNTER — Encounter: Payer: Self-pay | Admitting: Physical Therapy

## 2019-08-16 ENCOUNTER — Other Ambulatory Visit: Payer: Self-pay

## 2019-08-16 DIAGNOSIS — M25662 Stiffness of left knee, not elsewhere classified: Secondary | ICD-10-CM | POA: Diagnosis not present

## 2019-08-16 DIAGNOSIS — M25562 Pain in left knee: Secondary | ICD-10-CM | POA: Diagnosis not present

## 2019-08-16 DIAGNOSIS — R600 Localized edema: Secondary | ICD-10-CM

## 2019-08-16 DIAGNOSIS — M6281 Muscle weakness (generalized): Secondary | ICD-10-CM

## 2019-08-16 DIAGNOSIS — G8929 Other chronic pain: Secondary | ICD-10-CM

## 2019-08-16 NOTE — Therapy (Signed)
[place], [place], [address] Phone: [phone]   Fax:  [phone]  Physical Therapy Treatment  Patient Details  Name: Joshua Gaines MRN: [id number] Date of Birth: November 08, 1980 Referring Provider (PT): Clayton Lefort MD   Encounter Date: 08/16/2019  PT End of Session - 08/16/19 1542    Visit Number  14    Number of Visits  25    Date for PT Re-Evaluation  10/09/19    PT Start Time  1501    PT Stop Time  1543    PT Time Calculation (min)  42 min    Activity Tolerance  Patient tolerated treatment well    Behavior During Therapy  [facility] for tasks assessed/performed       Past Medical History:  Diagnosis Date  . GERD (gastroesophageal reflux disease)   . PONV (postoperative nausea and vomiting)     Past Surgical History:  Procedure Laterality Date  . ANTERIOR CRUCIATE LIGAMENT REPAIR  [id number]  . CHONDROPLASTY Right 02/01/2018   Procedure: CHONDROPLASTY;  Surgeon: Hiram Gash, MD;  Location: [place];  Service: Orthopedics;  Laterality: Right;  . HARDWARE REMOVAL Right 02/01/2018   Procedure: HARDWARE REMOVAL;  Surgeon: Hiram Gash, MD;  Location: [place];  Service: Orthopedics;  Laterality: Right;  . HARDWARE REMOVAL Left 06/11/2019   Procedure: HARDWARE REMOVAL;  Surgeon: Hiram Gash, MD;  Location: [place];  Service: Orthopedics;  Laterality: Left;  . HERNIA REPAIR  aprox 2010 or 2013   right inguinal   . KNEE ARTHROSCOPY WITH ANTERIOR CRUCIATE LIGAMENT (ACL) REPAIR Left 06/11/2019   Procedure: KNEE ARTHROSCOPY WITH ANTERIOR CRUCIATE LIGAMENT (ACL) REPAIR WITH HAMSTRING AUTOGRAFT;  Surgeon: Hiram Gash, MD;  Location: [place];  Service: Orthopedics;  Laterality: Left;  . KNEE ARTHROSCOPY WITH ANTERIOR CRUCIATE LIGAMENT (ACL) REPAIR WITH HAMSTRING GRAFT Right 02/01/2018   Procedure: RIGHT KNEE ARTHROSCOPY WITH POSSIBLE REVISION  ANTERIOR CRUCIATE LIGAMENT (ACL) REPAIR WITH AUTOGRAFT HAMSTRING;  Surgeon: Hiram Gash, MD;  Location: [employer];  Service: Orthopedics;  Laterality: Right;  . KNEE ARTHROSCOPY WITH LATERAL MENISECTOMY Right 02/01/2018   Procedure: RIGHT KNEE ARTHROSCOPY WITH LATERAL MENISECTOMY;  Surgeon: Hiram Gash, MD;  Location: [place];  Service: Orthopedics;  Laterality: Right;  . KNEE ARTHROSCOPY WITH MEDIAL MENISECTOMY Right 02/01/2018   Procedure: RIGHT KNEE ARTHROSCOPY WITH MEDIAL MENISECTOMY, CHONDROPLASTY;  Surgeon: Hiram Gash, MD;  Location: [place];  Service: Orthopedics;  Laterality: Right;  . MENISCUS REPAIR     left knee    There were no vitals filed for this visit.  Subjective Assessment - 08/16/19 1502    Subjective  " no complaints today"    Patient Stated Goals  decrease pain, get back into shape, return to playing basketball    Currently in Pain?  No/denies         [facility] PT Assessment - 08/16/19 0001      Assessment   Medical Diagnosis  L knee revision ACL reconstruction with Auto graft hamstring and hardware removal    Referring Provider (PT)  Clayton Lefort MD                   [facility] Adult PT Treatment/Exercise - 08/16/19 0001      Knee/Hip Exercises: Stretches   Active Hamstring Stretch  2 reps;30 seconds    Quad Stretch  2 reps;Left;30 seconds  Knee/Hip Exercises: Aerobic   Recumbent Bike  L5 x 5 min    Stepper  L3 x 4 min      Knee/Hip Exercises: Standing   Forward Lunges  2 sets;20 reps;Both    Side Lunges  1 set;15 reps;Both    Lateral Step Up  2 sets;15 reps   onto off of bosu   Forward Step Up  2 sets;15 reps   onto bosu   Other Standing Knee Exercises  dead lift 1 x 15 25# from 6 inch step, 1 x 15 25# from floor      Knee/Hip Exercises: Seated   Other Seated Knee/Hip Exercises  kneeling russian eccentric hamstring curl 1 x 10      Knee/Hip Exercises: Supine   Bridges   Strengthening;2 sets;15 reps   with heels on physioball and hamstring curl         Balance Exercises - 08/16/19 1546      Balance Exercises: Standing   Rebounder  Single leg;Foam/compliant surface;10 reps   forwrad, facing to the L/ and R with blue ball         PT Short Term Goals - 08/07/19 1601      PT SHORT TERM GOAL #1   Title  pt to be I with inital HEp     Period  Weeks    Status  Achieved      PT SHORT TERM GOAL #2   Title  pt to verbalize and demo techniques to reduce pain and inflammation via RICE and HEP    Period  Weeks    Status  Achieved      PT SHORT TERM GOAL #3   Title  pt to demo good quad activation and SLR with </= 5 degree quad lag for functional progression    Period  Weeks    Status  Achieved      PT SHORT TERM GOAL #4   Title  pt to increase knee flexion to >/= 110 degrees with </= 4/10 pain for therapuetic progression    Period  Weeks    Status  Achieved        PT Long Term Goals - 08/14/19 1550      PT LONG TERM GOAL #1   Title  increase ROM to </= 2 degrees and >/= 120 degrees with </= 1/10 pain for functional ROM required for efficent gait and ADLs    Baseline  3 - 127 degrees today with no pain noted    Time  8    Period  Weeks    Status  Partially Met      PT LONG TERM GOAL #2   Title  pt to demo RLE strength to 5/5 in all planes to promote knee stability with standing/ walking and dynamic activities    Baseline  unable ot assess quad strength due to precautions    Period  Weeks    Status  On-going      PT LONG TERM GOAL #3   Title  pt to be able to perfor dynamic activities and jogging/ hopping with no report of pain or instability for personal goal of returning to playing basketball     Period  Weeks    Status  On-going      PT LONG TERM GOAL #4   Title  increase FOTO score to </= 33% limited to demo improvement in function    Period  Weeks    Status  Achieved  PT LONG TERM GOAL #5   Title  pt to be I with all  HEP given as of last visit to maintain and progress current level of function independently.     Period  Weeks    Status  On-going            Plan - 08/16/19 1545    Clinical Impression Statement  [name] continues to make great progress with PT. focused session on quad and hamstring strengthening which she continues to respond well to. continued quad activation with CKC as protoocl dictates.    PT Treatment/Interventions  ADLs/Self Care Home Management;Cryotherapy;Electrical Stimulation;Iontophoresis 32m/ml Dexamethasone;Moist Heat;Ultrasound;Gait training;Stair training;Therapeutic activities;Therapeutic exercise;Balance training;Neuromuscular re-education;Manual techniques;Passive range of motion;Taping;Vasopneumatic Device;Patient/family education;Dry needling    PT Next Visit Plan  review/ update HEP PRN, pt is 9 weeks post op on 08/13/2019, follow protocol, knee ROM, patellar mobs, STW along distal hamstrings, closed kinetic chain strength, hip strength prone, spanish squat, balance training, TRX strengthening       Patient will benefit from skilled therapeutic intervention in order to improve the following deficits and impairments:  Improper body mechanics, Increased muscle spasms, Decreased strength, Abnormal gait, Pain, Postural dysfunction, Decreased endurance, Decreased activity tolerance, Decreased balance, Increased edema, Decreased range of motion  Visit Diagnosis: Chronic pain of left knee  Localized edema  Stiffness of left knee, not elsewhere classified  Muscle weakness (generalized)     Problem List Patient Active Problem List   Diagnosis Date Noted  . Left varicocele 08/22/2018  . Right Pelvic Subcutaneous Nodule 08/22/2018  . Localized osteoarthritis of left knee 03/02/2018  . Annual physical exam 12/23/2016  . Acute meniscal tear of right knee 02/27/2015  . Right foot pain 01/19/2013    KStarr LakePT, DPT, LAT, ATC  08/16/19  3:48 PM      CDickeyCThe Eye Surgery Center [facility]GFrenchburg [place] [address]Phone: [phone]  Fax:  [phone] Name: JMINOR IDENMRN: [id number] of Birth: [birth year]

## 2019-08-21 ENCOUNTER — Ambulatory Visit: Payer: 59 | Admitting: Physical Therapy

## 2019-08-21 ENCOUNTER — Other Ambulatory Visit: Payer: Self-pay

## 2019-08-21 DIAGNOSIS — G8929 Other chronic pain: Secondary | ICD-10-CM

## 2019-08-21 DIAGNOSIS — M25662 Stiffness of left knee, not elsewhere classified: Secondary | ICD-10-CM | POA: Diagnosis not present

## 2019-08-21 DIAGNOSIS — M25562 Pain in left knee: Secondary | ICD-10-CM

## 2019-08-21 DIAGNOSIS — R600 Localized edema: Secondary | ICD-10-CM

## 2019-08-21 DIAGNOSIS — M6281 Muscle weakness (generalized): Secondary | ICD-10-CM | POA: Diagnosis not present

## 2019-08-21 NOTE — Therapy (Signed)
[place], [place], [address] Phone: [phone]   Fax:  [phone]  Physical Therapy Treatment  Patient Details  Name: Joshua Gaines MRN: [id number] Date of Birth: 1981/02/24 Referring Provider (PT): Clayton Lefort MD   Encounter Date: 08/21/2019  PT End of Session - 08/21/19 1548    Visit Number  15    Number of Visits  25    Date for PT Re-Evaluation  10/09/19    PT Start Time  1503    PT Stop Time  1544    PT Time Calculation (min)  41 min    Activity Tolerance  Patient tolerated treatment well    Behavior During Therapy  [facility] for tasks assessed/performed       Past Medical History:  Diagnosis Date  . GERD (gastroesophageal reflux disease)   . PONV (postoperative nausea and vomiting)     Past Surgical History:  Procedure Laterality Date  . ANTERIOR CRUCIATE LIGAMENT REPAIR  [id number]  . CHONDROPLASTY Right 02/01/2018   Procedure: CHONDROPLASTY;  Surgeon: Hiram Gash, MD;  Location: [place];  Service: Orthopedics;  Laterality: Right;  . HARDWARE REMOVAL Right 02/01/2018   Procedure: HARDWARE REMOVAL;  Surgeon: Hiram Gash, MD;  Location: [place];  Service: Orthopedics;  Laterality: Right;  . HARDWARE REMOVAL Left 06/11/2019   Procedure: HARDWARE REMOVAL;  Surgeon: Hiram Gash, MD;  Location: [place];  Service: Orthopedics;  Laterality: Left;  . HERNIA REPAIR  aprox 2010 or 2013   right inguinal   . KNEE ARTHROSCOPY WITH ANTERIOR CRUCIATE LIGAMENT (ACL) REPAIR Left 06/11/2019   Procedure: KNEE ARTHROSCOPY WITH ANTERIOR CRUCIATE LIGAMENT (ACL) REPAIR WITH HAMSTRING AUTOGRAFT;  Surgeon: Hiram Gash, MD;  Location: [place];  Service: Orthopedics;  Laterality: Left;  . KNEE ARTHROSCOPY WITH ANTERIOR CRUCIATE LIGAMENT (ACL) REPAIR WITH HAMSTRING GRAFT Right 02/01/2018   Procedure: RIGHT KNEE ARTHROSCOPY WITH POSSIBLE REVISION  ANTERIOR CRUCIATE LIGAMENT (ACL) REPAIR WITH AUTOGRAFT HAMSTRING;  Surgeon: Hiram Gash, MD;  Location: [place];  Service: Orthopedics;  Laterality: Right;  . KNEE ARTHROSCOPY WITH LATERAL MENISECTOMY Right 02/01/2018   Procedure: RIGHT KNEE ARTHROSCOPY WITH LATERAL MENISECTOMY;  Surgeon: Hiram Gash, MD;  Location: [place];  Service: Orthopedics;  Laterality: Right;  . KNEE ARTHROSCOPY WITH MEDIAL MENISECTOMY Right 02/01/2018   Procedure: RIGHT KNEE ARTHROSCOPY WITH MEDIAL MENISECTOMY, CHONDROPLASTY;  Surgeon: Hiram Gash, MD;  Location: [place];  Service: Orthopedics;  Laterality: Right;  . MENISCUS REPAIR     left knee    There were no vitals filed for this visit.  Subjective Assessment - 08/21/19 1505    Subjective  "I am doing well, i want to run"    Patient Stated Goals  decrease pain, get back into shape, return to playing basketball    Currently in Pain?  No/denies    Aggravating Factors   N/a         [facility] PT Assessment - 08/21/19 0001      Assessment   Medical Diagnosis  L knee revision ACL reconstruction with Auto graft hamstring and hardware removal    Referring Provider (PT)  Clayton Lefort MD                   [facility] Adult PT Treatment/Exercise - 08/21/19 0001      Knee/Hip Exercises: Aerobic   Stepper  L5 x 5  min       Knee/Hip Exercises: Machines for Strengthening   Cybex Knee Flexion  2 x 20 20# LLE only    Cybex Leg Press  LLE only 1 x 15 40#, 1 x 15 60#    Hip Cybex  hip abduction 25# 1 x 20 bil , bil hip flexion 2 x 20 37.5#      Knee/Hip Exercises: Standing   Other Standing Knee Exercises  standing star lunge 1 x 5 ea direction 2x LLE      Knee/Hip Exercises: Prone   Hamstring Curl  4 sets   x 30 sec kicking heels to physioball              PT Short Term Goals - 08/07/19 1601      PT SHORT TERM GOAL #1   Title  pt to be I with inital HEp     Period  Weeks    Status   Achieved      PT SHORT TERM GOAL #2   Title  pt to verbalize and demo techniques to reduce pain and inflammation via RICE and HEP    Period  Weeks    Status  Achieved      PT SHORT TERM GOAL #3   Title  pt to demo good quad activation and SLR with </= 5 degree quad lag for functional progression    Period  Weeks    Status  Achieved      PT SHORT TERM GOAL #4   Title  pt to increase knee flexion to >/= 110 degrees with </= 4/10 pain for therapuetic progression    Period  Weeks    Status  Achieved        PT Long Term Goals - 08/14/19 1550      PT LONG TERM GOAL #1   Title  increase ROM to </= 2 degrees and >/= 120 degrees with </= 1/10 pain for functional ROM required for efficent gait and ADLs    Baseline  3 - 127 degrees today with no pain noted    Time  8    Period  Weeks    Status  Partially Met      PT LONG TERM GOAL #2   Title  pt to demo RLE strength to 5/5 in all planes to promote knee stability with standing/ walking and dynamic activities    Baseline  unable ot assess quad strength due to precautions    Period  Weeks    Status  On-going      PT LONG TERM GOAL #3   Title  pt to be able to perfor dynamic activities and jogging/ hopping with no report of pain or instability for personal goal of returning to playing basketball     Period  Weeks    Status  On-going      PT LONG TERM GOAL #4   Title  increase FOTO score to </= 33% limited to demo improvement in function    Period  Weeks    Status  Achieved      PT LONG TERM GOAL #5   Title  pt to be I with all HEP given as of last visit to maintain and progress current level of function independently.     Period  Weeks    Status  On-going            Plan - 08/21/19 1549    Clinical Impression Statement  pt continues to make  excellent progress with physical therapy. pt is 10 weeks post op on 08/20/2019, continued working hip/ knee strengthening increasing resistance and reps to maximize endurance. end of  session he noted no issues.    PT Treatment/Interventions  ADLs/Self Care Home Management;Cryotherapy;Electrical Stimulation;Iontophoresis 31m/ml Dexamethasone;Moist Heat;Ultrasound;Gait training;Stair training;Therapeutic activities;Therapeutic exercise;Balance training;Neuromuscular re-education;Manual techniques;Passive range of motion;Taping;Vasopneumatic Device;Patient/family education;Dry needling    PT Next Visit Plan  review/ update HEP PRN, pt is 10 weeks post op on 08/20/2019, follow protocol, knee ROM, patellar mobs, STW along distal hamstrings, closed kinetic chain strength, hip strength prone, spanish squat, balance training, TRX strengthening    PT Home Exercise Plan  [id number] - quad set, SLR, hamstring stretch (supine and seated), heel slide with strap, wall squat, dead lift    Consulted and Agree with Plan of Care  Patient       Patient will benefit from skilled therapeutic intervention in order to improve the following deficits and impairments:  Improper body mechanics, Increased muscle spasms, Decreased strength, Abnormal gait, Pain, Postural dysfunction, Decreased endurance, Decreased activity tolerance, Decreased balance, Increased edema, Decreased range of motion  Visit Diagnosis: Chronic pain of left knee  Localized edema  Stiffness of left knee, not elsewhere classified     Problem List Patient Active Problem List   Diagnosis Date Noted  . Left varicocele 08/22/2018  . Right Pelvic Subcutaneous Nodule 08/22/2018  . Localized osteoarthritis of left knee 03/02/2018  . Annual physical exam 12/23/2016  . Acute meniscal tear of right knee 02/27/2015  . Right foot pain 01/19/2013   KStarr LakePT, DPT, LAT, ATC  08/21/19  3:53 PM      CHayfieldCEndoscopy Center Of The South Bay[address]GDevers N[place] [address]Phone: [phone]  Fax:  [phone] Name: JREEDER BRISBYMRN: [id number] of Birth: [birth year]

## 2019-08-28 ENCOUNTER — Encounter: Payer: Self-pay | Admitting: Physical Therapy

## 2019-08-28 ENCOUNTER — Other Ambulatory Visit: Payer: Self-pay

## 2019-08-28 ENCOUNTER — Ambulatory Visit: Payer: 59 | Admitting: Physical Therapy

## 2019-08-28 DIAGNOSIS — M25662 Stiffness of left knee, not elsewhere classified: Secondary | ICD-10-CM

## 2019-08-28 DIAGNOSIS — G8929 Other chronic pain: Secondary | ICD-10-CM

## 2019-08-28 DIAGNOSIS — M25562 Pain in left knee: Secondary | ICD-10-CM | POA: Diagnosis not present

## 2019-08-28 DIAGNOSIS — M6281 Muscle weakness (generalized): Secondary | ICD-10-CM | POA: Diagnosis not present

## 2019-08-28 DIAGNOSIS — R600 Localized edema: Secondary | ICD-10-CM

## 2019-08-28 NOTE — Therapy (Signed)
[place], [place], [address] Phone: [phone]   Fax:  [phone]  Physical Therapy Treatment  Patient Details  Name: Joshua Gaines MRN: [id number] Date of Birth: 19-Apr-1981 Referring Provider (PT): Clayton Lefort MD   Encounter Date: 08/28/2019  PT End of Session - 08/28/19 1709    Visit Number  16    Number of Visits  25    Date for PT Re-Evaluation  10/09/19    PT Start Time  7425    PT Stop Time  1630    PT Time Calculation (min)  44 min    Activity Tolerance  Patient tolerated treatment well    Behavior During Therapy  [facility] for tasks assessed/performed       Past Medical History:  Diagnosis Date  . GERD (gastroesophageal reflux disease)   . PONV (postoperative nausea and vomiting)     Past Surgical History:  Procedure Laterality Date  . ANTERIOR CRUCIATE LIGAMENT REPAIR  [id number]  . CHONDROPLASTY Right 02/01/2018   Procedure: CHONDROPLASTY;  Surgeon: Hiram Gash, MD;  Location: [place];  Service: Orthopedics;  Laterality: Right;  . HARDWARE REMOVAL Right 02/01/2018   Procedure: HARDWARE REMOVAL;  Surgeon: Hiram Gash, MD;  Location: [place];  Service: Orthopedics;  Laterality: Right;  . HARDWARE REMOVAL Left 06/11/2019   Procedure: HARDWARE REMOVAL;  Surgeon: Hiram Gash, MD;  Location: [place];  Service: Orthopedics;  Laterality: Left;  . HERNIA REPAIR  aprox 2010 or 2013   right inguinal   . KNEE ARTHROSCOPY WITH ANTERIOR CRUCIATE LIGAMENT (ACL) REPAIR Left 06/11/2019   Procedure: KNEE ARTHROSCOPY WITH ANTERIOR CRUCIATE LIGAMENT (ACL) REPAIR WITH HAMSTRING AUTOGRAFT;  Surgeon: Hiram Gash, MD;  Location: [place];  Service: Orthopedics;  Laterality: Left;  . KNEE ARTHROSCOPY WITH ANTERIOR CRUCIATE LIGAMENT (ACL) REPAIR WITH HAMSTRING GRAFT Right 02/01/2018   Procedure: RIGHT KNEE ARTHROSCOPY WITH POSSIBLE REVISION  ANTERIOR CRUCIATE LIGAMENT (ACL) REPAIR WITH AUTOGRAFT HAMSTRING;  Surgeon: Hiram Gash, MD;  Location: [place];  Service: Orthopedics;  Laterality: Right;  . KNEE ARTHROSCOPY WITH LATERAL MENISECTOMY Right 02/01/2018   Procedure: RIGHT KNEE ARTHROSCOPY WITH LATERAL MENISECTOMY;  Surgeon: Hiram Gash, MD;  Location: [place];  Service: Orthopedics;  Laterality: Right;  . KNEE ARTHROSCOPY WITH MEDIAL MENISECTOMY Right 02/01/2018   Procedure: RIGHT KNEE ARTHROSCOPY WITH MEDIAL MENISECTOMY, CHONDROPLASTY;  Surgeon: Hiram Gash, MD;  Location: [place];  Service: Orthopedics;  Laterality: Right;  . MENISCUS REPAIR     left knee    There were no vitals filed for this visit.  Subjective Assessment - 08/28/19 1550    Subjective  "I am doing pretty good, no issues"    Patient Stated Goals  decrease pain, get back into shape, return to playing basketball    Currently in Pain?  No/denies                       [facility] Adult PT Treatment/Exercise - 08/28/19 0001      Knee/Hip Exercises: Stretches   Active Hamstring Stretch  --    Quad Stretch  Left;2 reps;30 seconds      Knee/Hip Exercises: Aerobic   Recumbent Bike  L5 x 5 min    at end of session   Stepper  L5 x 5 min       Knee/Hip Exercises: Standing  Forward Lunges  1 set;Both;15 reps    Side Lunges  1 set;15 reps    Other Standing Knee Exercises  dead lift 2 x 20 30# floor to waist    cues for slower eccentric loading   Other Standing Knee Exercises  bulgarian split squat 2 x 15 LLE SLS    eccentric loading     Knee/Hip Exercises: Supine   Other Supine Knee/Hip Exercises  TRX hamstring curl with sustained bridge 2 x 5   limited more regarding  Rknee extension     Manual Therapy   Manual therapy comments  skilled palpation and monitoring of pt throughout TPDN    Soft tissue mobilization  IASTM along VL        Trigger Point Dry Needling - 08/28/19 0001     Consent Given?  Yes    Education Handout Provided  Previously provided    Vastus lateralis Response  Twitch response elicited;Palpable increased muscle length             PT Short Term Goals - 08/07/19 1601      PT SHORT TERM GOAL #1   Title  pt to be I with inital HEp     Period  Weeks    Status  Achieved      PT SHORT TERM GOAL #2   Title  pt to verbalize and demo techniques to reduce pain and inflammation via RICE and HEP    Period  Weeks    Status  Achieved      PT SHORT TERM GOAL #3   Title  pt to demo good quad activation and SLR with </= 5 degree quad lag for functional progression    Period  Weeks    Status  Achieved      PT SHORT TERM GOAL #4   Title  pt to increase knee flexion to >/= 110 degrees with </= 4/10 pain for therapuetic progression    Period  Weeks    Status  Achieved        PT Long Term Goals - 08/14/19 1550      PT LONG TERM GOAL #1   Title  increase ROM to </= 2 degrees and >/= 120 degrees with </= 1/10 pain for functional ROM required for efficent gait and ADLs    Baseline  3 - 127 degrees today with no pain noted    Time  8    Period  Weeks    Status  Partially Met      PT LONG TERM GOAL #2   Title  pt to demo RLE strength to 5/5 in all planes to promote knee stability with standing/ walking and dynamic activities    Baseline  unable ot assess quad strength due to precautions    Period  Weeks    Status  On-going      PT LONG TERM GOAL #3   Title  pt to be able to perfor dynamic activities and jogging/ hopping with no report of pain or instability for personal goal of returning to playing basketball     Period  Weeks    Status  On-going      PT LONG TERM GOAL #4   Title  increase FOTO score to </= 33% limited to demo improvement in function    Period  Weeks    Status  Achieved      PT LONG TERM GOAL #5   Title  pt to be I with all HEP given  as of last visit to maintain and progress current level of function independently.      Period  Weeks    Status  On-going            Plan - 08/28/19 1709    Clinical Impression Statement  pt reported no pain starting session but noted increased soreness in the quad tendon after lunges. Due to increaed quad/ patellar soreness TPDN was performed along the VL followed with IASTM techniques. continued working CKC quad strength with emphasis on eccentric load to promote quad tendon remodeling.    PT Treatment/Interventions  ADLs/Self Care Home Management;Cryotherapy;Electrical Stimulation;Iontophoresis 68m/ml Dexamethasone;Moist Heat;Ultrasound;Gait training;Stair training;Therapeutic activities;Therapeutic exercise;Balance training;Neuromuscular re-education;Manual techniques;Passive range of motion;Taping;Vasopneumatic Device;Patient/family education;Dry needling    PT Next Visit Plan  review/ update HEP PRN, pt is 11 weeks post op on 08/20/2019, follow protocol, knee ROM, patellar mobs, STW along distal hamstrings, closed kinetic chain strength, hip strength prone, spanish squat, balance training, TRX strengthening    PT Home Exercise Plan  [id number] - quad set, SLR, hamstring stretch (supine and seated), heel slide with strap, wall squat, dead lift    Consulted and Agree with Plan of Care  Patient       Patient will benefit from skilled therapeutic intervention in order to improve the following deficits and impairments:  Improper body mechanics, Increased muscle spasms, Decreased strength, Abnormal gait, Pain, Postural dysfunction, Decreased endurance, Decreased activity tolerance, Decreased balance, Increased edema, Decreased range of motion  Visit Diagnosis: Chronic pain of left knee  Localized edema  Stiffness of left knee, not elsewhere classified     Problem List Patient Active Problem List   Diagnosis Date Noted  . Left varicocele 08/22/2018  . Right Pelvic Subcutaneous Nodule 08/22/2018  . Localized osteoarthritis of left knee 03/02/2018  . Annual physical exam  12/23/2016  . Acute meniscal tear of right knee 02/27/2015  . Right foot pain 01/19/2013   KStarr LakePT, DPT, LAT, ATC  08/28/19  5:13 PM      CSpangleCRenal Intervention Center LLC[address]GSpringfield N[place] [address]Phone: [phone]  Fax:  [phone] Name: Joshua DUBRAYMRN: [id number] of Birth: [birth year]

## 2019-09-03 ENCOUNTER — Other Ambulatory Visit: Payer: Self-pay

## 2019-09-03 ENCOUNTER — Encounter: Payer: Self-pay | Admitting: Physical Therapy

## 2019-09-03 ENCOUNTER — Ambulatory Visit: Payer: 59 | Attending: Sports Medicine | Admitting: Physical Therapy

## 2019-09-03 DIAGNOSIS — R600 Localized edema: Secondary | ICD-10-CM | POA: Diagnosis present

## 2019-09-03 DIAGNOSIS — M25562 Pain in left knee: Secondary | ICD-10-CM | POA: Insufficient documentation

## 2019-09-03 DIAGNOSIS — M6281 Muscle weakness (generalized): Secondary | ICD-10-CM | POA: Insufficient documentation

## 2019-09-03 DIAGNOSIS — G8929 Other chronic pain: Secondary | ICD-10-CM | POA: Insufficient documentation

## 2019-09-03 DIAGNOSIS — M25662 Stiffness of left knee, not elsewhere classified: Secondary | ICD-10-CM | POA: Diagnosis present

## 2019-09-03 NOTE — Therapy (Signed)
[place], [place], [address] Phone: [phone]   Fax:  [phone]  Physical Therapy Treatment  Patient Details  Name: Joshua Gaines MRN: [id number] Date of Birth: 1980-05-21 Referring Provider (PT): Clayton Lefort MD   Encounter Date: 09/03/2019  PT End of Session - 09/03/19 1330    Visit Number  17    Number of Visits  25    Date for PT Re-Evaluation  10/09/19    PT Start Time  1331    PT Stop Time  1414    PT Time Calculation (min)  43 min    Activity Tolerance  Patient tolerated treatment well    Behavior During Therapy  [facility] for tasks assessed/performed       Past Medical History:  Diagnosis Date  . GERD (gastroesophageal reflux disease)   . PONV (postoperative nausea and vomiting)     Past Surgical History:  Procedure Laterality Date  . ANTERIOR CRUCIATE LIGAMENT REPAIR  [id number]  . CHONDROPLASTY Right 02/01/2018   Procedure: CHONDROPLASTY;  Surgeon: Hiram Gash, MD;  Location: [place];  Service: Orthopedics;  Laterality: Right;  . HARDWARE REMOVAL Right 02/01/2018   Procedure: HARDWARE REMOVAL;  Surgeon: Hiram Gash, MD;  Location: [place];  Service: Orthopedics;  Laterality: Right;  . HARDWARE REMOVAL Left 06/11/2019   Procedure: HARDWARE REMOVAL;  Surgeon: Hiram Gash, MD;  Location: [place];  Service: Orthopedics;  Laterality: Left;  . HERNIA REPAIR  aprox 2010 or 2013   right inguinal   . KNEE ARTHROSCOPY WITH ANTERIOR CRUCIATE LIGAMENT (ACL) REPAIR Left 06/11/2019   Procedure: KNEE ARTHROSCOPY WITH ANTERIOR CRUCIATE LIGAMENT (ACL) REPAIR WITH HAMSTRING AUTOGRAFT;  Surgeon: Hiram Gash, MD;  Location: [place];  Service: Orthopedics;  Laterality: Left;  . KNEE ARTHROSCOPY WITH ANTERIOR CRUCIATE LIGAMENT (ACL) REPAIR WITH HAMSTRING GRAFT Right 02/01/2018   Procedure: RIGHT KNEE ARTHROSCOPY WITH POSSIBLE REVISION  ANTERIOR CRUCIATE LIGAMENT (ACL) REPAIR WITH AUTOGRAFT HAMSTRING;  Surgeon: Hiram Gash, MD;  Location: [place];  Service: Orthopedics;  Laterality: Right;  . KNEE ARTHROSCOPY WITH LATERAL MENISECTOMY Right 02/01/2018   Procedure: RIGHT KNEE ARTHROSCOPY WITH LATERAL MENISECTOMY;  Surgeon: Hiram Gash, MD;  Location: [place];  Service: Orthopedics;  Laterality: Right;  . KNEE ARTHROSCOPY WITH MEDIAL MENISECTOMY Right 02/01/2018   Procedure: RIGHT KNEE ARTHROSCOPY WITH MEDIAL MENISECTOMY, CHONDROPLASTY;  Surgeon: Hiram Gash, MD;  Location: [place];  Service: Orthopedics;  Laterality: Right;  . MENISCUS REPAIR     left knee    There were no vitals filed for this visit.  Subjective Assessment - 09/03/19 1333    Subjective  "No complaints"    Patient Stated Goals  decrease pain, get back into shape, return to playing basketball    Currently in Pain?  No/denies    Pain Score  0-No pain         [facility] PT Assessment - 09/03/19 0001      Assessment   Medical Diagnosis  L knee revision ACL reconstruction with Auto graft hamstring and hardware removal    Referring Provider (PT)  Clayton Lefort MD                   [facility] Adult PT Treatment/Exercise - 09/03/19 0001      Knee/Hip Exercises: Stretches   Quad Stretch  2 reps;30 seconds   standing with  Knee/Hip Exercises: Aerobic   Stepper  L5 x 6 min      Knee/Hip Exercises: Plyometrics   Other Plyometric Exercises  pre-jumping raising on the toes and quick squat mimikcing jumping/ landing 2 x 15      Knee/Hip Exercises: Standing   Forward Lunges  10 reps;Left;2 sets   1 setbackward lung with LLE on Bosu   Other Standing Knee Exercises  lateral band walks 4 x 50 ft with green band, monster walks 4 x 30 ft with green theraband      Knee/Hip Exercises: Seated   Stool Scoot - Round Trips  4 x 50 ft LLE only 2 x  with no resistance and 2 x awith green badn           Balance Exercises - 09/03/19 1351      Balance Exercises: Standing   Rebounder  Single leg;Foam/compliant surface;10 reps    Other Standing Exercises  cone taps SLS in 1/2 circle           PT Short Term Goals - 08/07/19 1601      PT SHORT TERM GOAL #1   Title  pt to be I with inital HEp     Period  Weeks    Status  Achieved      PT SHORT TERM GOAL #2   Title  pt to verbalize and demo techniques to reduce pain and inflammation via RICE and HEP    Period  Weeks    Status  Achieved      PT SHORT TERM GOAL #3   Title  pt to demo good quad activation and SLR with </= 5 degree quad lag for functional progression    Period  Weeks    Status  Achieved      PT SHORT TERM GOAL #4   Title  pt to increase knee flexion to >/= 110 degrees with </= 4/10 pain for therapuetic progression    Period  Weeks    Status  Achieved        PT Long Term Goals - 08/14/19 1550      PT LONG TERM GOAL #1   Title  increase ROM to </= 2 degrees and >/= 120 degrees with </= 1/10 pain for functional ROM required for efficent gait and ADLs    Baseline  3 - 127 degrees today with no pain noted    Time  8    Period  Weeks    Status  Partially Met      PT LONG TERM GOAL #2   Title  pt to demo RLE strength to 5/5 in all planes to promote knee stability with standing/ walking and dynamic activities    Baseline  unable ot assess quad strength due to precautions    Period  Weeks    Status  On-going      PT LONG TERM GOAL #3   Title  pt to be able to perfor dynamic activities and jogging/ hopping with no report of pain or instability for personal goal of returning to playing basketball     Period  Weeks    Status  On-going      PT LONG TERM GOAL #4   Title  increase FOTO score to </= 33% limited to demo improvement in function    Period  Weeks    Status  Achieved      PT LONG TERM GOAL #5   Title  pt to be I with all HEP  given as of last visit to maintain and progress current level of  function independently.     Period  Weeks    Status  On-going            Plan - 09/03/19 1418    Clinical Impression Statement  continued working on hip /knee strengthening incorporating additional balance training which he demonstrates difficulty maintaining balance but is making progress. no report of pain or issues in the L knee. He does note some soreness at end range flexion on the R knee described as catching but otherwise no residual pain or issues once out of position.    PT Treatment/Interventions  ADLs/Self Care Home Management;Cryotherapy;Electrical Stimulation;Iontophoresis 5m/ml Dexamethasone;Moist Heat;Ultrasound;Gait training;Stair training;Therapeutic activities;Therapeutic exercise;Balance training;Neuromuscular re-education;Manual techniques;Passive range of motion;Taping;Vasopneumatic Device;Patient/family education;Dry needling    PT Next Visit Plan  review/ update HEP PRN, pt is 13 weeks post op on 09/03/2019, follow protocol, knee ROM, patellar mobs, STW along distal hamstrings, closed kinetic chain strength, hip strength prone, spanish squat, balance training, TRX strengthening    PT Home Exercise Plan  [id number] - quad set, SLR, hamstring stretch (supine and seated), heel slide with strap, wall squat, dead lift    Consulted and Agree with Plan of Care  Patient       Patient will benefit from skilled therapeutic intervention in order to improve the following deficits and impairments:  Improper body mechanics, Increased muscle spasms, Decreased strength, Abnormal gait, Pain, Postural dysfunction, Decreased endurance, Decreased activity tolerance, Decreased balance, Increased edema, Decreased range of motion  Visit Diagnosis: Chronic pain of left knee  Localized edema  Stiffness of left knee, not elsewhere classified  Muscle weakness (generalized)     Problem List Patient Active Problem List   Diagnosis Date Noted  . Left varicocele 08/22/2018  . Right Pelvic  Subcutaneous Nodule 08/22/2018  . Localized osteoarthritis of left knee 03/02/2018  . Annual physical exam 12/23/2016  . Acute meniscal tear of right knee 02/27/2015  . Right foot pain 01/19/2013   KStarr LakePT, DPT, LAT, ATC  09/03/19  2:25 PM      CWinnebagoCK[facility] [address]Phone: [phone]  Fax:  [phone] Name: Joshua METZGARMRN: [id number] of Birth: [birth year]

## 2019-09-11 ENCOUNTER — Ambulatory Visit: Payer: 59 | Admitting: Physical Therapy

## 2019-09-18 ENCOUNTER — Other Ambulatory Visit: Payer: Self-pay

## 2019-09-18 ENCOUNTER — Ambulatory Visit: Payer: 59 | Admitting: Physical Therapy

## 2019-09-18 DIAGNOSIS — R600 Localized edema: Secondary | ICD-10-CM

## 2019-09-18 DIAGNOSIS — M25662 Stiffness of left knee, not elsewhere classified: Secondary | ICD-10-CM | POA: Diagnosis not present

## 2019-09-18 DIAGNOSIS — M25562 Pain in left knee: Secondary | ICD-10-CM

## 2019-09-18 DIAGNOSIS — G8929 Other chronic pain: Secondary | ICD-10-CM

## 2019-09-18 DIAGNOSIS — M6281 Muscle weakness (generalized): Secondary | ICD-10-CM | POA: Diagnosis not present

## 2019-09-18 NOTE — Therapy (Signed)
[place], [place], [address] Phone: [phone]   Fax:  [phone]  Physical Therapy Treatment  Patient Details  Name: Joshua Gaines MRN: [id number] Date of Birth: [birth year] Referring Provider (PT): Clayton Lefort MD   Encounter Date: 09/18/2019  PT End of Session - 09/18/19 1416    Visit Number  18    Number of Visits  25    Date for PT Re-Evaluation  10/09/19    PT Start Time  2924    PT Stop Time  1500    PT Time Calculation (min)  45 min    Activity Tolerance  Patient tolerated treatment well    Behavior During Therapy  [facility] for tasks assessed/performed       Past Medical History:  Diagnosis Date  . GERD (gastroesophageal reflux disease)   . PONV (postoperative nausea and vomiting)     Past Surgical History:  Procedure Laterality Date  . ANTERIOR CRUCIATE LIGAMENT REPAIR  [id number]  . CHONDROPLASTY Right 02/01/2018   Procedure: CHONDROPLASTY;  Surgeon: Hiram Gash, MD;  Location: [place];  Service: Orthopedics;  Laterality: Right;  . HARDWARE REMOVAL Right 02/01/2018   Procedure: HARDWARE REMOVAL;  Surgeon: Hiram Gash, MD;  Location: [place];  Service: Orthopedics;  Laterality: Right;  . HARDWARE REMOVAL Left 06/11/2019   Procedure: HARDWARE REMOVAL;  Surgeon: Hiram Gash, MD;  Location: [place];  Service: Orthopedics;  Laterality: Left;  . HERNIA REPAIR  aprox 2010 or 2013   right inguinal   . KNEE ARTHROSCOPY WITH ANTERIOR CRUCIATE LIGAMENT (ACL) REPAIR Left 06/11/2019   Procedure: KNEE ARTHROSCOPY WITH ANTERIOR CRUCIATE LIGAMENT (ACL) REPAIR WITH HAMSTRING AUTOGRAFT;  Surgeon: Hiram Gash, MD;  Location: [place];  Service: Orthopedics;  Laterality: Left;  . KNEE ARTHROSCOPY WITH ANTERIOR CRUCIATE LIGAMENT (ACL) REPAIR WITH HAMSTRING GRAFT Right 02/01/2018   Procedure: RIGHT KNEE ARTHROSCOPY WITH POSSIBLE REVISION  ANTERIOR CRUCIATE LIGAMENT (ACL) REPAIR WITH AUTOGRAFT HAMSTRING;  Surgeon: Hiram Gash, MD;  Location: [place];  Service: Orthopedics;  Laterality: Right;  . KNEE ARTHROSCOPY WITH LATERAL MENISECTOMY Right 02/01/2018   Procedure: RIGHT KNEE ARTHROSCOPY WITH LATERAL MENISECTOMY;  Surgeon: Hiram Gash, MD;  Location: [place];  Service: Orthopedics;  Laterality: Right;  . KNEE ARTHROSCOPY WITH MEDIAL MENISECTOMY Right 02/01/2018   Procedure: RIGHT KNEE ARTHROSCOPY WITH MEDIAL MENISECTOMY, CHONDROPLASTY;  Surgeon: Hiram Gash, MD;  Location: [place];  Service: Orthopedics;  Laterality: Right;  . MENISCUS REPAIR     left knee    There were no vitals filed for this visit.  Subjective Assessment - 09/18/19 1417    Subjective  " No issues"    Patient Stated Goals  decrease pain, get back into shape, return to playing basketball    Currently in Pain?  Yes    Pain Score  0-No pain         [facility] PT Assessment - 09/18/19 0001      Assessment   Medical Diagnosis  L knee revision ACL reconstruction with Auto graft hamstring and hardware removal    Referring Provider (PT)  Clayton Lefort MD      Functional Tests   Functional tests  [facility]   Comments  triple hop RLE 17.1 , LLE 15.95  [place] Adult PT Treatment/Exercise - 09/18/19 0001      Knee/Hip Exercises: Stretches   Active Hamstring Stretch  2 reps;30 seconds    Quad Stretch  2 reps;30 seconds    Gastroc Stretch  2 reps;30 seconds;Both      Knee/Hip Exercises: Aerobic   Elliptical  L2 x 6 min ramp L8      Knee/Hip Exercises: Plyometrics   Other Plyometric Exercises  alternating toe taps CW/CCW 2 x 20 sec ea      Knee/Hip Exercises: Standing   Heel Raises  2 sets;15 reps   1 set with knee extended, and 1 with knee flexed   Other Standing Knee Exercises  pistol squat to table  1 x 20 LLE only      Knee/Hip Exercises: Seated    Other Seated Knee/Hip Exercises  kneeling russian eccentric hamstring curl 1 x 20          Balance Exercises - 09/18/19 1442      Balance Exercises: Standing   Rebounder  Foam/compliant surface;20 reps   with yellow ball       PT Education - 09/18/19 1505    Education Details  reviewed HEP and updated today for ankle strengthening    Person(s) Educated  Patient    Methods  Explanation;Verbal cues;Handout    Comprehension  Verbalized understanding;Verbal cues required       PT Short Term Goals - 08/07/19 1601      PT SHORT TERM GOAL #1   Title  pt to be I with inital HEp     Period  Weeks    Status  Achieved      PT SHORT TERM GOAL #2   Title  pt to verbalize and demo techniques to reduce pain and inflammation via RICE and HEP    Period  Weeks    Status  Achieved      PT SHORT TERM GOAL #3   Title  pt to demo good quad activation and SLR with </= 5 degree quad lag for functional progression    Period  Weeks    Status  Achieved      PT SHORT TERM GOAL #4   Title  pt to increase knee flexion to >/= 110 degrees with </= 4/10 pain for therapuetic progression    Period  Weeks    Status  Achieved        PT Long Term Goals - 08/14/19 1550      PT LONG TERM GOAL #1   Title  increase ROM to </= 2 degrees and >/= 120 degrees with </= 1/10 pain for functional ROM required for efficent gait and ADLs    Baseline  3 - 127 degrees today with no pain noted    Time  8    Period  Weeks    Status  Partially Met      PT LONG TERM GOAL #2   Title  pt to demo RLE strength to 5/5 in all planes to promote knee stability with standing/ walking and dynamic activities    Baseline  unable ot assess quad strength due to precautions    Period  Weeks    Status  On-going      PT LONG TERM GOAL #3   Title  pt to be able to perfor dynamic activities and jogging/ hopping with no report of pain or instability for personal goal of returning to playing basketball     Period  Weeks  Status  On-going      PT LONG TERM GOAL #4   Title  increase FOTO score to </= 33% limited to demo improvement in function    Period  Weeks    Status  Achieved      PT LONG TERM GOAL #5   Title  pt to be I with all HEP given as of last visit to maintain and progress current level of function independently.     Period  Weeks    Status  On-going            Plan - 09/18/19 1501    Clinical Impression Statement  pt is 14 weeks post op on 09/17/2019 and continues to make excellent progress. continued working on hip/ knee strengthening staying in CKC per protocol, increasing reps/ sets. worked began working on Actor which he did well with.    PT Next Visit Plan  review/ update HEP PRN, pt is 14 weeks post op on 09/07/2019, follow protocol, knee ROM, patellar mobs, STW along distal hamstrings,TRX strengthening, balance, plyometric training,    PT Home Exercise Plan  [id number] - quad set, SLR, hamstring stretch (supine and seated), heel slide with strap, wall squat, dead lift, ankle IR/ER, DF and PF    Consulted and Agree with Plan of Care  Patient       Patient will benefit from skilled therapeutic intervention in order to improve the following deficits and impairments:     Visit Diagnosis: Chronic pain of left knee  Localized edema  Stiffness of left knee, not elsewhere classified  Muscle weakness (generalized)     Problem List Patient Active Problem List   Diagnosis Date Noted  . Left varicocele 08/22/2018  . Right Pelvic Subcutaneous Nodule 08/22/2018  . Localized osteoarthritis of left knee 03/02/2018  . Annual physical exam 12/23/2016  . Acute meniscal tear of right knee 02/27/2015  . Right foot pain 01/19/2013   Starr Lake PT, DPT, LAT, ATC  09/18/19  3:07 PM      [place] [facility] [address] [place], [place], [address] Phone: [phone]   Fax:  [phone]  Name: EASON HOUSMAN MRN:  [id number] Date of Birth: 11/29/1980

## 2019-09-25 ENCOUNTER — Ambulatory Visit: Payer: 59 | Admitting: Physical Therapy

## 2019-10-02 ENCOUNTER — Other Ambulatory Visit: Payer: Self-pay

## 2019-10-02 ENCOUNTER — Ambulatory Visit: Payer: 59 | Attending: Sports Medicine | Admitting: Physical Therapy

## 2019-10-02 ENCOUNTER — Encounter: Payer: Self-pay | Admitting: Physical Therapy

## 2019-10-02 DIAGNOSIS — M25662 Stiffness of left knee, not elsewhere classified: Secondary | ICD-10-CM | POA: Insufficient documentation

## 2019-10-02 DIAGNOSIS — R600 Localized edema: Secondary | ICD-10-CM | POA: Insufficient documentation

## 2019-10-02 DIAGNOSIS — G8929 Other chronic pain: Secondary | ICD-10-CM | POA: Insufficient documentation

## 2019-10-02 DIAGNOSIS — M6281 Muscle weakness (generalized): Secondary | ICD-10-CM | POA: Diagnosis present

## 2019-10-02 DIAGNOSIS — M25562 Pain in left knee: Secondary | ICD-10-CM | POA: Diagnosis present

## 2019-10-02 NOTE — Therapy (Signed)
[place], [place], [address] Phone: [phone]   Fax:  [phone]  Physical Therapy Treatment  Patient Details  Name: Joshua Gaines MRN: [id number] Date of Birth: Jan 13, 1981 Referring Provider (PT): Clayton Lefort MD   Encounter Date: 10/02/2019  PT End of Session - 10/02/19 1419    Visit Number  19    Number of Visits  25    Date for PT Re-Evaluation  10/09/19    PT Start Time  9024    PT Stop Time  1500    PT Time Calculation (min)  43 min    Activity Tolerance  Patient tolerated treatment well    Behavior During Therapy  [facility] for tasks assessed/performed       Past Medical History:  Diagnosis Date  . GERD (gastroesophageal reflux disease)   . PONV (postoperative nausea and vomiting)     Past Surgical History:  Procedure Laterality Date  . ANTERIOR CRUCIATE LIGAMENT REPAIR  [id number]  . CHONDROPLASTY Right 02/01/2018   Procedure: CHONDROPLASTY;  Surgeon: Hiram Gash, MD;  Location: [place];  Service: Orthopedics;  Laterality: Right;  . HARDWARE REMOVAL Right 02/01/2018   Procedure: HARDWARE REMOVAL;  Surgeon: Hiram Gash, MD;  Location: [place];  Service: Orthopedics;  Laterality: Right;  . HARDWARE REMOVAL Left 06/11/2019   Procedure: HARDWARE REMOVAL;  Surgeon: Hiram Gash, MD;  Location: [place];  Service: Orthopedics;  Laterality: Left;  . HERNIA REPAIR  aprox 2010 or 2013   right inguinal   . KNEE ARTHROSCOPY WITH ANTERIOR CRUCIATE LIGAMENT (ACL) REPAIR Left 06/11/2019   Procedure: KNEE ARTHROSCOPY WITH ANTERIOR CRUCIATE LIGAMENT (ACL) REPAIR WITH HAMSTRING AUTOGRAFT;  Surgeon: Hiram Gash, MD;  Location: [place];  Service: Orthopedics;  Laterality: Left;  . KNEE ARTHROSCOPY WITH ANTERIOR CRUCIATE LIGAMENT (ACL) REPAIR WITH HAMSTRING GRAFT Right 02/01/2018   Procedure: RIGHT KNEE ARTHROSCOPY WITH POSSIBLE REVISION  ANTERIOR CRUCIATE LIGAMENT (ACL) REPAIR WITH AUTOGRAFT HAMSTRING;  Surgeon: Hiram Gash, MD;  Location: [place];  Service: Orthopedics;  Laterality: Right;  . KNEE ARTHROSCOPY WITH LATERAL MENISECTOMY Right 02/01/2018   Procedure: RIGHT KNEE ARTHROSCOPY WITH LATERAL MENISECTOMY;  Surgeon: Hiram Gash, MD;  Location: [place];  Service: Orthopedics;  Laterality: Right;  . KNEE ARTHROSCOPY WITH MEDIAL MENISECTOMY Right 02/01/2018   Procedure: RIGHT KNEE ARTHROSCOPY WITH MEDIAL MENISECTOMY, CHONDROPLASTY;  Surgeon: Hiram Gash, MD;  Location: [place];  Service: Orthopedics;  Laterality: Right;  . MENISCUS REPAIR     left knee    There were no vitals filed for this visit.  Subjective Assessment - 10/02/19 1421    Subjective  "no issues just some stiffness in the knee but overall doing well"    Currently in Pain?  No/denies    Pain Onset  More than a month ago    Pain Frequency  Rarely    Aggravating Factors   N/A                        [facility] Adult PT Treatment/Exercise - 10/02/19 0001      Knee/Hip Exercises: Stretches   Active Hamstring Stretch  2 reps;30 seconds   with strap   Quad Stretch  2 reps;30 seconds      Knee/Hip Exercises: Aerobic   Recumbent Bike  L10 x 6 min  Knee/Hip Exercises: Machines for Strengthening   Cybex Knee Extension  bil con/LLE Ecc 2 x 15 20#      Knee/Hip Exercises: Plyometrics   Other Plyometric Exercises  jump alternating lunges 2 x 10     Other Plyometric Exercises  jump squats 2 x 10      Knee/Hip Exercises: Seated   Other Seated Knee/Hip Exercises  kneeling russian eccentric hamstring curl 2 x 15      Knee/Hip Exercises: Prone   Other Prone Exercises  kicking ball on back for speed 4 x 30 sec          Balance Exercises - 10/02/19 1450      Balance Exercises: Standing   SLS  Eyes open;Foam/compliant surface;4 reps;30 secs   on round Ifitter         PT  Short Term Goals - 08/07/19 1601      PT SHORT TERM GOAL #1   Title  pt to be I with inital HEp     Period  Weeks    Status  Achieved      PT SHORT TERM GOAL #2   Title  pt to verbalize and demo techniques to reduce pain and inflammation via RICE and HEP    Period  Weeks    Status  Achieved      PT SHORT TERM GOAL #3   Title  pt to demo good quad activation and SLR with </= 5 degree quad lag for functional progression    Period  Weeks    Status  Achieved      PT SHORT TERM GOAL #4   Title  pt to increase knee flexion to >/= 110 degrees with </= 4/10 pain for therapuetic progression    Period  Weeks    Status  Achieved        PT Long Term Goals - 08/14/19 1550      PT LONG TERM GOAL #1   Title  increase ROM to </= 2 degrees and >/= 120 degrees with </= 1/10 pain for functional ROM required for efficent gait and ADLs    Baseline  3 - 127 degrees today with no pain noted    Time  8    Period  Weeks    Status  Partially Met      PT LONG TERM GOAL #2   Title  pt to demo RLE strength to 5/5 in all planes to promote knee stability with standing/ walking and dynamic activities    Baseline  unable ot assess quad strength due to precautions    Period  Weeks    Status  On-going      PT LONG TERM GOAL #3   Title  pt to be able to perfor dynamic activities and jogging/ hopping with no report of pain or instability for personal goal of returning to playing basketball     Period  Weeks    Status  On-going      PT LONG TERM GOAL #4   Title  increase FOTO score to </= 33% limited to demo improvement in function    Period  Weeks    Status  Achieved      PT LONG TERM GOAL #5   Title  pt to be I with all HEP given as of last visit to maintain and progress current level of function independently.     Period  Weeks    Status  On-going  Plan - 10/02/19 1501    Clinical Impression Statement  pt is 16 weeks post op. continued working hip/ knee strengthening which per  protocol he is able to perform OKC strengthening which he did well with. continued progressing balance and plyometric training he does report mild discomfort but in the back of the R knee. no pain or issues following session.    PT Treatment/Interventions  ADLs/Self Care Home Management;Cryotherapy;Electrical Stimulation;Iontophoresis 59m/ml Dexamethasone;Moist Heat;Ultrasound;Gait training;Stair training;Therapeutic activities;Therapeutic exercise;Balance training;Neuromuscular re-education;Manual techniques;Passive range of motion;Taping;Vasopneumatic Device;Patient/family education;Dry needling    PT Next Visit Plan  review/ update HEP PRN, pt is 14 weeks post op on 09/07/2019, follow protocol, knee ROM, patellar mobs, STW along distal hamstrings,TRX strengthening, balance, progress plyometric training, BFR?    PT Home Exercise Plan  [id number]- quad set, SLR, hamstring stretch (supine and seated), heel slide with strap, wall squat, dead lift, ankle IR/ER, DF and PF    Consulted and Agree with Plan of Care  Patient       Patient will benefit from skilled therapeutic intervention in order to improve the following deficits and impairments:  Improper body mechanics, Increased muscle spasms, Decreased strength, Abnormal gait, Pain, Postural dysfunction, Decreased endurance, Decreased activity tolerance, Decreased balance, Increased edema, Decreased range of motion  Visit Diagnosis: Chronic pain of left knee  Localized edema  Stiffness of left knee, not elsewhere classified     Problem List Patient Active Problem List   Diagnosis Date Noted  . Left varicocele 08/22/2018  . Right Pelvic Subcutaneous Nodule 08/22/2018  . Localized osteoarthritis of left knee 03/02/2018  . Annual physical exam 12/23/2016  . Acute meniscal tear of right knee 02/27/2015  . Right foot pain 01/19/2013   KStarr LakePT, DPT, LAT, ATC  10/02/19  3:04 PM      CP[facility] [address]Phone: [phone]  Fax:  [phone] Name: JJAEDIN REGINAMRN: [id number] of Birth: 9December 12, 1982

## 2019-10-09 ENCOUNTER — Other Ambulatory Visit: Payer: Self-pay

## 2019-10-09 ENCOUNTER — Ambulatory Visit: Payer: 59 | Admitting: Physical Therapy

## 2019-10-09 DIAGNOSIS — M25662 Stiffness of left knee, not elsewhere classified: Secondary | ICD-10-CM | POA: Diagnosis not present

## 2019-10-09 DIAGNOSIS — M25562 Pain in left knee: Secondary | ICD-10-CM | POA: Diagnosis not present

## 2019-10-09 DIAGNOSIS — R600 Localized edema: Secondary | ICD-10-CM | POA: Diagnosis not present

## 2019-10-09 DIAGNOSIS — G8929 Other chronic pain: Secondary | ICD-10-CM | POA: Diagnosis not present

## 2019-10-09 DIAGNOSIS — M6281 Muscle weakness (generalized): Secondary | ICD-10-CM | POA: Diagnosis not present

## 2019-10-09 NOTE — Therapy (Signed)
[place], [place], [address] Phone: [phone]   Fax:  [phone]  Physical Therapy Treatment  Patient Details  Name: Joshua Gaines MRN: [id number] Date of Birth: 1981-04-11 Referring Provider (PT): Clayton Lefort MD   Encounter Date: 10/09/2019  PT End of Session - 10/09/19 1420    Visit Number  20    Number of Visits  25    Date for PT Re-Evaluation  10/09/19    PT Start Time  7106    PT Stop Time  1500    PT Time Calculation (min)  45 min    Activity Tolerance  Patient tolerated treatment well    Behavior During Therapy  [facility] for tasks assessed/performed       Past Medical History:  Diagnosis Date  . GERD (gastroesophageal reflux disease)   . PONV (postoperative nausea and vomiting)     Past Surgical History:  Procedure Laterality Date  . ANTERIOR CRUCIATE LIGAMENT REPAIR  [id number]  . CHONDROPLASTY Right 02/01/2018   Procedure: CHONDROPLASTY;  Surgeon: Hiram Gash, MD;  Location: [place];  Service: Orthopedics;  Laterality: Right;  . HARDWARE REMOVAL Right 02/01/2018   Procedure: HARDWARE REMOVAL;  Surgeon: Hiram Gash, MD;  Location: [place];  Service: Orthopedics;  Laterality: Right;  . HARDWARE REMOVAL Left 06/11/2019   Procedure: HARDWARE REMOVAL;  Surgeon: Hiram Gash, MD;  Location: [place];  Service: Orthopedics;  Laterality: Left;  . HERNIA REPAIR  aprox 2010 or 2013   right inguinal   . KNEE ARTHROSCOPY WITH ANTERIOR CRUCIATE LIGAMENT (ACL) REPAIR Left 06/11/2019   Procedure: KNEE ARTHROSCOPY WITH ANTERIOR CRUCIATE LIGAMENT (ACL) REPAIR WITH HAMSTRING AUTOGRAFT;  Surgeon: Hiram Gash, MD;  Location: [place];  Service: Orthopedics;  Laterality: Left;  . KNEE ARTHROSCOPY WITH ANTERIOR CRUCIATE LIGAMENT (ACL) REPAIR WITH HAMSTRING GRAFT Right 02/01/2018   Procedure: RIGHT KNEE ARTHROSCOPY WITH POSSIBLE REVISION  ANTERIOR CRUCIATE LIGAMENT (ACL) REPAIR WITH AUTOGRAFT HAMSTRING;  Surgeon: Hiram Gash, MD;  Location: [place];  Service: Orthopedics;  Laterality: Right;  . KNEE ARTHROSCOPY WITH LATERAL MENISECTOMY Right 02/01/2018   Procedure: RIGHT KNEE ARTHROSCOPY WITH LATERAL MENISECTOMY;  Surgeon: Hiram Gash, MD;  Location: [place];  Service: Orthopedics;  Laterality: Right;  . KNEE ARTHROSCOPY WITH MEDIAL MENISECTOMY Right 02/01/2018   Procedure: RIGHT KNEE ARTHROSCOPY WITH MEDIAL MENISECTOMY, CHONDROPLASTY;  Surgeon: Hiram Gash, MD;  Location: [place];  Service: Orthopedics;  Laterality: Right;  . MENISCUS REPAIR     left knee    There were no vitals filed for this visit.  Subjective Assessment - 10/09/19 1420    Subjective  " I had things come up and I missed my appointment with [facility] Basil but its rescheduled to next Tuesday."    Currently in Pain?  No/denies    Pain Orientation  Left         [facility] PT Assessment - 10/09/19 0001      Assessment   Medical Diagnosis  L knee revision ACL reconstruction with Auto graft hamstring and hardware removal    Referring Provider (PT)  Clayton Lefort MD                    [facility] Adult PT Treatment/Exercise - 10/09/19 0001      Knee/Hip Exercises: Stretches   Active Hamstring Stretch  2 reps;30 seconds    Quad  Stretch  2 reps;30 seconds      Knee/Hip Exercises: Aerobic   Elliptical  L6 x 6 min ramp L10      Knee/Hip Exercises: Machines for Strengthening   Cybex Knee Flexion  LLE only 2 x going to fatigue 30#    Hip Cybex  hip abudction bil 2 sets going to fatigue      Knee/Hip Exercises: Plyometrics   Other Plyometric Exercises  Ladder: high knees 1 leg in ea rung x 4, double limb forward hop x 4, lateral double limb hop x 4, forward/lateral movement x 4, l single leg hop x 4.    Other Plyometric Exercises  lateral shuffling 4 x 50 ft      Knee/Hip Exercises: Standing   Other  Standing Knee Exercises  dead lift with barbell 2 x 15 125#    Other Standing Knee Exercises  medicine ball squat throws 1 x 15 8#           Balance Exercises - 10/09/19 0001      Balance Exercises: Standing   SLS  Solid surface   2 x 10 with ball toss across multiple angles   SLS Limitations  Attmpted single leg stance on Bosu but pt had difficulty, halted moved to floor.          PT Short Term Goals - 08/07/19 1601      PT SHORT TERM GOAL #1   Title  pt to be I with inital HEp     Period  Weeks    Status  Achieved      PT SHORT TERM GOAL #2   Title  pt to verbalize and demo techniques to reduce pain and inflammation via RICE and HEP    Period  Weeks    Status  Achieved      PT SHORT TERM GOAL #3   Title  pt to demo good quad activation and SLR with </= 5 degree quad lag for functional progression    Period  Weeks    Status  Achieved      PT SHORT TERM GOAL #4   Title  pt to increase knee flexion to >/= 110 degrees with </= 4/10 pain for therapuetic progression    Period  Weeks    Status  Achieved        PT Long Term Goals - 08/14/19 1550      PT LONG TERM GOAL #1   Title  increase ROM to </= 2 degrees and >/= 120 degrees with </= 1/10 pain for functional ROM required for efficent gait and ADLs    Baseline  3 - 127 degrees today with no pain noted    Time  8    Period  Weeks    Status  Partially Met      PT LONG TERM GOAL #2   Title  pt to demo RLE strength to 5/5 in all planes to promote knee stability with standing/ walking and dynamic activities    Baseline  unable ot assess quad strength due to precautions    Period  Weeks    Status  On-going      PT LONG TERM GOAL #3   Title  pt to be able to perfor dynamic activities and jogging/ hopping with no report of pain or instability for personal goal of returning to playing basketball     Period  Weeks    Status  On-going      PT LONG TERM GOAL #  4   Title  increase FOTO score to </= 33% limited to demo  improvement in function    Period  Weeks    Status  Achieved      PT LONG TERM GOAL #5   Title  pt to be I with all HEP given as of last visit to maintain and progress current level of function independently.     Period  Weeks    Status  On-going            Plan - 10/09/19 1510    Clinical Impression Statement  continued working on hip/ knee strength increasing load and reps going to fatigue. He did well with plyometric activity report only mild popping which occured with high knees but noted no pain, progress to lateral movements double and single leg with no report issues. He does continue to be limited with single limb balance on unstable surfaces. no report of pain following session.    PT Treatment/Interventions  ADLs/Self Care Home Management;Cryotherapy;Electrical Stimulation;Iontophoresis 26m/ml Dexamethasone;Moist Heat;Ultrasound;Gait training;Stair training;Therapeutic activities;Therapeutic exercise;Balance training;Neuromuscular re-education;Manual techniques;Passive range of motion;Taping;Vasopneumatic Device;Patient/family education;Dry needling    PT Next Visit Plan  review/ update HEP PRN, pt is 17 weeks post op on 10/08/2019, follow protocol, knee ROM, patellar mobs, STW along distal hamstrings,TRX strengthening, balance, progress plyometric training, BFR?    PT Home Exercise Plan  [id number]- quad set, SLR, hamstring stretch (supine and seated), heel slide with strap, wall squat, dead lift, ankle IR/ER, DF and PF    Consulted and Agree with Plan of Care  Patient       Patient will benefit from skilled therapeutic intervention in order to improve the following deficits and impairments:  Improper body mechanics, Increased muscle spasms, Decreased strength, Abnormal gait, Pain, Postural dysfunction, Decreased endurance, Decreased activity tolerance, Decreased balance, Increased edema, Decreased range of motion  Visit Diagnosis: Chronic pain of left knee  Localized  edema  Stiffness of left knee, not elsewhere classified  Muscle weakness (generalized)     Problem List Patient Active Problem List   Diagnosis Date Noted  . Left varicocele 08/22/2018  . Right Pelvic Subcutaneous Nodule 08/22/2018  . Localized osteoarthritis of left knee 03/02/2018  . Annual physical exam 12/23/2016  . Acute meniscal tear of right knee 02/27/2015  . Right foot pain 01/19/2013   KStarr LakePT, DPT, LAT, ATC  10/09/19  3:16 PM      CDutch JohnCVa [facility] [address]Phone: [phone]  Fax:  [phone] Name: Joshua STEPHENSMRN: [id number] of Birth: [birth year]

## 2019-10-16 ENCOUNTER — Ambulatory Visit: Payer: 59 | Admitting: Physical Therapy

## 2019-10-16 ENCOUNTER — Telehealth: Payer: Self-pay | Admitting: Physical Therapy

## 2019-10-16 DIAGNOSIS — S83512D Sprain of anterior cruciate ligament of left knee, subsequent encounter: Secondary | ICD-10-CM | POA: Diagnosis not present

## 2019-10-16 NOTE — Telephone Encounter (Signed)
Spoke with pt regarding missed appointment today. He stated he had a busy day and completely forgot about his PT appointment. He did state he saw the MD and got clearance to run and was doing well. He stated he knew when his next scheduled appointment will be here for it.    Laurence Crofford PT, DPT, LAT, ATC  10/16/19  3:41 PM    .

## 2019-10-23 ENCOUNTER — Ambulatory Visit: Payer: 59 | Admitting: Physical Therapy

## 2019-10-23 ENCOUNTER — Encounter: Payer: Self-pay | Admitting: Physical Therapy

## 2019-10-23 ENCOUNTER — Other Ambulatory Visit: Payer: Self-pay

## 2019-10-23 ENCOUNTER — Other Ambulatory Visit: Payer: Self-pay | Admitting: Orthopaedic Surgery

## 2019-10-23 DIAGNOSIS — G8929 Other chronic pain: Secondary | ICD-10-CM | POA: Diagnosis not present

## 2019-10-23 DIAGNOSIS — M2341 Loose body in knee, right knee: Secondary | ICD-10-CM

## 2019-10-23 DIAGNOSIS — R600 Localized edema: Secondary | ICD-10-CM

## 2019-10-23 DIAGNOSIS — M6281 Muscle weakness (generalized): Secondary | ICD-10-CM

## 2019-10-23 DIAGNOSIS — M25662 Stiffness of left knee, not elsewhere classified: Secondary | ICD-10-CM

## 2019-10-23 DIAGNOSIS — M25562 Pain in left knee: Secondary | ICD-10-CM

## 2019-10-23 NOTE — Therapy (Signed)
[place], [place], [address] Phone: [phone]   Fax:  [phone]  Physical Therapy Treatment  Patient Details  Name: Joshua Gaines MRN: [id number] Date of Birth: October 17, 1980 Referring Provider (PT): Clayton Lefort MD   Encounter Date: 10/23/2019   PT End of Session - 10/23/19 1502    Visit Number 21    Number of Visits 25    Date for PT Re-Evaluation 10/09/19    PT Start Time 1502    PT Stop Time 6712    PT Time Calculation (min) 42 min    Activity Tolerance Patient tolerated treatment well    Behavior During Therapy [facility] for tasks assessed/performed           Past Medical History:  Diagnosis Date  . GERD (gastroesophageal reflux disease)   . PONV (postoperative nausea and vomiting)     Past Surgical History:  Procedure Laterality Date  . ANTERIOR CRUCIATE LIGAMENT REPAIR  [id number]  . CHONDROPLASTY Right 02/01/2018   Procedure: CHONDROPLASTY;  Surgeon: Hiram Gash, MD;  Location: [place];  Service: Orthopedics;  Laterality: Right;  . HARDWARE REMOVAL Right 02/01/2018   Procedure: HARDWARE REMOVAL;  Surgeon: Hiram Gash, MD;  Location: [place];  Service: Orthopedics;  Laterality: Right;  . HARDWARE REMOVAL Left 06/11/2019   Procedure: HARDWARE REMOVAL;  Surgeon: Hiram Gash, MD;  Location: [place];  Service: Orthopedics;  Laterality: Left;  . HERNIA REPAIR  aprox 2010 or 2013   right inguinal   . KNEE ARTHROSCOPY WITH ANTERIOR CRUCIATE LIGAMENT (ACL) REPAIR Left 06/11/2019   Procedure: KNEE ARTHROSCOPY WITH ANTERIOR CRUCIATE LIGAMENT (ACL) REPAIR WITH HAMSTRING AUTOGRAFT;  Surgeon: Hiram Gash, MD;  Location: [place];  Service: Orthopedics;  Laterality: Left;  . KNEE ARTHROSCOPY WITH ANTERIOR CRUCIATE LIGAMENT (ACL) REPAIR WITH HAMSTRING GRAFT Right 02/01/2018   Procedure: RIGHT KNEE ARTHROSCOPY WITH POSSIBLE REVISION  ANTERIOR CRUCIATE LIGAMENT (ACL) REPAIR WITH AUTOGRAFT HAMSTRING;  Surgeon: Hiram Gash, MD;  Location: [place];  Service: Orthopedics;  Laterality: Right;  . KNEE ARTHROSCOPY WITH LATERAL MENISECTOMY Right 02/01/2018   Procedure: RIGHT KNEE ARTHROSCOPY WITH LATERAL MENISECTOMY;  Surgeon: Hiram Gash, MD;  Location: [place];  Service: Orthopedics;  Laterality: Right;  . KNEE ARTHROSCOPY WITH MEDIAL MENISECTOMY Right 02/01/2018   Procedure: RIGHT KNEE ARTHROSCOPY WITH MEDIAL MENISECTOMY, CHONDROPLASTY;  Surgeon: Hiram Gash, MD;  Location: [place];  Service: Orthopedics;  Laterality: Right;  . MENISCUS REPAIR     left knee    There were no vitals filed for this visit.   Subjective Assessment - 10/23/19 1502    Subjective "Feels good today. Was a little sore after last session."    Currently in Pain? No/denies    Pain Score 0-No pain                             [facility] Adult PT Treatment/Exercise - 10/23/19 0001      Knee/Hip Exercises: Stretches   Active Hamstring Stretch 2 reps;30 seconds    Quad Stretch 2 reps;30 seconds      Knee/Hip Exercises: Aerobic   Tread Mill 4.5x6 min      Knee/Hip Exercises: Plyometrics   Box Circuit 1 set;10 reps;Box Height: 6"   cues for proper form to avoid weight shift to the R   Other Plyometric  Exercises high knees for height 2 x 40 ft, 2 x for speed x 20 ft   alternating toe tops on 6 inch step 2 x 30 sec   Other Plyometric Exercises lateral shuffle 2 x 40 ft      Knee/Hip Exercises: Standing   Other Standing Knee Exercises dead lift with barbell 2 x 15 125#    Other Standing Knee Exercises SLS wall squat with red physioball 2 x 10    increased sway              Balance Exercises - 10/23/19 0001      Balance Exercises: Standing   Rebounder Foam/compliant surface;Single leg;20 reps   1 x forward, 1 x facing L/ R with blue ball              PT Short Term  Goals - 08/07/19 1601      PT SHORT TERM GOAL #1   Title pt to be I with inital HEp     Period Weeks    Status Achieved      PT SHORT TERM GOAL #2   Title pt to verbalize and demo techniques to reduce pain and inflammation via RICE and HEP    Period Weeks    Status Achieved      PT SHORT TERM GOAL #3   Title pt to demo good quad activation and SLR with </= 5 degree quad lag for functional progression    Period Weeks    Status Achieved      PT SHORT TERM GOAL #4   Title pt to increase knee flexion to >/= 110 degrees with </= 4/10 pain for therapuetic progression    Period Weeks    Status Achieved             PT Long Term Goals - 08/14/19 1550      PT LONG TERM GOAL #1   Title increase ROM to </= 2 degrees and >/= 120 degrees with </= 1/10 pain for functional ROM required for efficent gait and ADLs    Baseline 3 - 127 degrees today with no pain noted    Time 8    Period Weeks    Status Partially Met      PT LONG TERM GOAL #2   Title pt to demo RLE strength to 5/5 in all planes to promote knee stability with standing/ walking and dynamic activities    Baseline unable ot assess quad strength due to precautions    Period Weeks    Status On-going      PT LONG TERM GOAL #3   Title pt to be able to perfor dynamic activities and jogging/ hopping with no report of pain or instability for personal goal of returning to playing basketball     Period Weeks    Status On-going      PT LONG TERM GOAL #4   Title increase FOTO score to </= 33% limited to demo improvement in function    Period Weeks    Status Achieved      PT LONG TERM GOAL #5   Title pt to be I with all HEP given as of last visit to maintain and progress current level of function independently.     Period Weeks    Status On-going                 Plan - 10/23/19 1548    Clinical Impression Statement pt continues to do very well with  physical therapy. continued progressiong per MD's orders of returning  to flat ground running. continued to challenge higher level plyometrics which he did require verbal cues for proper form with landing. He continues to respond well to strengthening with no report of pain during for following session.    PT Treatment/Interventions ADLs/Self Care Home Management;Cryotherapy;Electrical Stimulation;Iontophoresis 44m/ml Dexamethasone;Moist Heat;Ultrasound;Gait training;Stair training;Therapeutic activities;Therapeutic exercise;Balance training;Neuromuscular re-education;Manual techniques;Passive range of motion;Taping;Vasopneumatic Device;Patient/family education;Dry needling    PT Next Visit Plan review/ update HEP PRN, pt is 19 weeks post op on 10/22/2019, follow protocol, STW along distal hamstrings,TRX strengthening, balance, progress plyometric training, BFR?    PT Home Exercise Plan [id number]- quad set, SLR, hamstring stretch (supine and seated), heel slide with strap, wall squat, dead lift, ankle IR/ER, DF and PF    Consulted and Agree with Plan of Care Patient           Patient will benefit from skilled therapeutic intervention in order to improve the following deficits and impairments:  Improper body mechanics, Increased muscle spasms, Decreased strength, Abnormal gait, Pain, Postural dysfunction, Decreased endurance, Decreased activity tolerance, Decreased balance, Increased edema, Decreased range of motion  Visit Diagnosis: Chronic pain of left knee  Localized edema  Stiffness of left knee, not elsewhere classified  Muscle weakness (generalized)     Problem List Patient Active Problem List   Diagnosis Date Noted  . Left varicocele 08/22/2018  . Right Pelvic Subcutaneous Nodule 08/22/2018  . Localized osteoarthritis of left knee 03/02/2018  . Annual physical exam 12/23/2016  . Acute meniscal tear of right knee 02/27/2015  . Right foot pain 01/19/2013   KStarr LakePT, DPT, LAT, ATC  10/23/19  3:53 PM      CH[facility]irdCSh[facility] Institute[address] [place] [address]Phone: [phone]  Fax:  [phone] Name: Joshua CAFFREYMRN: [id number] of Birth: 9March 18, 1982

## 2019-10-27 ENCOUNTER — Other Ambulatory Visit: Payer: Self-pay

## 2019-10-27 ENCOUNTER — Ambulatory Visit (INDEPENDENT_AMBULATORY_CARE_PROVIDER_SITE_OTHER): Payer: 59

## 2019-10-27 DIAGNOSIS — M2341 Loose body in knee, right knee: Secondary | ICD-10-CM | POA: Diagnosis not present

## 2019-10-27 DIAGNOSIS — M1711 Unilateral primary osteoarthritis, right knee: Secondary | ICD-10-CM | POA: Diagnosis not present

## 2019-10-30 ENCOUNTER — Ambulatory Visit: Payer: 59 | Admitting: Physical Therapy

## 2019-11-13 ENCOUNTER — Encounter: Payer: Self-pay | Admitting: Physical Therapy

## 2019-11-13 ENCOUNTER — Other Ambulatory Visit: Payer: Self-pay

## 2019-11-13 ENCOUNTER — Ambulatory Visit: Payer: 59 | Attending: Sports Medicine | Admitting: Physical Therapy

## 2019-11-13 DIAGNOSIS — M6281 Muscle weakness (generalized): Secondary | ICD-10-CM | POA: Diagnosis present

## 2019-11-13 DIAGNOSIS — G8929 Other chronic pain: Secondary | ICD-10-CM | POA: Insufficient documentation

## 2019-11-13 DIAGNOSIS — M25562 Pain in left knee: Secondary | ICD-10-CM | POA: Diagnosis present

## 2019-11-13 DIAGNOSIS — M25662 Stiffness of left knee, not elsewhere classified: Secondary | ICD-10-CM | POA: Insufficient documentation

## 2019-11-13 DIAGNOSIS — R600 Localized edema: Secondary | ICD-10-CM | POA: Insufficient documentation

## 2019-11-13 NOTE — Therapy (Signed)
[place], [place], [address] Phone: [phone]   Fax:  [phone]  Physical Therapy Treatment / Re-certification  Patient Details  Name: Joshua Gaines MRN: [id number] Date of Birth: November 22, 1980 Referring Provider (PT): Clayton Lefort MD   Encounter Date: 11/13/2019   PT End of Session - 11/13/19 1507    Visit Number 22    Number of Visits 26    Date for PT Re-Evaluation 12/25/19    PT Start Time 1502    PT Stop Time 1287    PT Time Calculation (min) 42 min    Activity Tolerance Patient tolerated treatment well    Behavior During Therapy [facility] for tasks assessed/performed           Past Medical History:  Diagnosis Date  . GERD (gastroesophageal reflux disease)   . PONV (postoperative nausea and vomiting)     Past Surgical History:  Procedure Laterality Date  . ANTERIOR CRUCIATE LIGAMENT REPAIR  [id number]  . CHONDROPLASTY Right 02/01/2018   Procedure: CHONDROPLASTY;  Surgeon: Hiram Gash, MD;  Location: [place];  Service: Orthopedics;  Laterality: Right;  . HARDWARE REMOVAL Right 02/01/2018   Procedure: HARDWARE REMOVAL;  Surgeon: Hiram Gash, MD;  Location: [place];  Service: Orthopedics;  Laterality: Right;  . HARDWARE REMOVAL Left 06/11/2019   Procedure: HARDWARE REMOVAL;  Surgeon: Hiram Gash, MD;  Location: [place];  Service: Orthopedics;  Laterality: Left;  . HERNIA REPAIR  aprox 2010 or 2013   right inguinal   . KNEE ARTHROSCOPY WITH ANTERIOR CRUCIATE LIGAMENT (ACL) REPAIR Left 06/11/2019   Procedure: KNEE ARTHROSCOPY WITH ANTERIOR CRUCIATE LIGAMENT (ACL) REPAIR WITH HAMSTRING AUTOGRAFT;  Surgeon: Hiram Gash, MD;  Location: [place];  Service: Orthopedics;  Laterality: Left;  . KNEE ARTHROSCOPY WITH ANTERIOR CRUCIATE LIGAMENT (ACL) REPAIR WITH HAMSTRING GRAFT Right 02/01/2018   Procedure: RIGHT KNEE ARTHROSCOPY WITH  POSSIBLE REVISION ANTERIOR CRUCIATE LIGAMENT (ACL) REPAIR WITH AUTOGRAFT HAMSTRING;  Surgeon: Hiram Gash, MD;  Location: [place];  Service: Orthopedics;  Laterality: Right;  . KNEE ARTHROSCOPY WITH LATERAL MENISECTOMY Right 02/01/2018   Procedure: RIGHT KNEE ARTHROSCOPY WITH LATERAL MENISECTOMY;  Surgeon: Hiram Gash, MD;  Location: [place];  Service: Orthopedics;  Laterality: Right;  . KNEE ARTHROSCOPY WITH MEDIAL MENISECTOMY Right 02/01/2018   Procedure: RIGHT KNEE ARTHROSCOPY WITH MEDIAL MENISECTOMY, CHONDROPLASTY;  Surgeon: Hiram Gash, MD;  Location: [place];  Service: Orthopedics;  Laterality: Right;  . MENISCUS REPAIR     left knee    There were no vitals filed for this visit.   Subjective Assessment - 11/13/19 1507    Subjective " i am feeling pretty sore, over the weekend I went to [employer] and did top golf."    Patient Stated Goals decrease pain, get back into shape, return to playing basketball    Currently in Pain? No/denies              [facility] PT Assessment - 11/13/19 1508      Assessment   Medical Diagnosis L knee revision ACL reconstruction with Auto graft hamstring and hardware removal    Referring Provider (PT) Clayton Lefort MD      AROM   AROM Assessment Site Hip    Right/Left Hip Right;Left    Left Knee Flexion 129      Strength   Strength Assessment Site Hip  Right/Left Hip Left;Right    Left Hip Extension 4+/5    Left Hip ABduction 4+/5    Left Knee Flexion 5/5    Left Knee Extension 4+/5                         [facility] Adult PT Treatment/Exercise - 11/13/19 0001      Exercises   Exercises Other Exercises    Other Exercises  BFR cuff size L - bil LE  80% 152 mmhg recommened sets/reps    LOP 190 mmhg     Knee/Hip Exercises: Stretches   Active Hamstring Stretch 2 reps;30 seconds    Quad Stretch 2 reps;30 seconds      Knee/Hip Exercises: Aerobic   Tread Mill 5.5 x 4 min, 1 min  warm up at 1.5       Knee/Hip Exercises: Machines for Strengthening   Cybex Knee Extension bil 15#   bil BFR at recommend reps/ sets     Knee/Hip Exercises: Seated   Sit to Sand --   BFR placed bil LE with recommend reps/ sets     Knee/Hip Exercises: Supine   Straight Leg Raises Limitations BFR LLE only, completed reps/ sets                    PT Short Term Goals - 08/07/19 1601      PT SHORT TERM GOAL #1   Title pt to be I with inital HEp     Period Weeks    Status Achieved      PT SHORT TERM GOAL #2   Title pt to verbalize and demo techniques to reduce pain and inflammation via RICE and HEP    Period Weeks    Status Achieved      PT SHORT TERM GOAL #3   Title pt to demo good quad activation and SLR with </= 5 degree quad lag for functional progression    Period Weeks    Status Achieved      PT SHORT TERM GOAL #4   Title pt to increase knee flexion to >/= 110 degrees with </= 4/10 pain for therapuetic progression    Period Weeks    Status Achieved             PT Long Term Goals - 11/13/19 1518      PT LONG TERM GOAL #1   Title increase ROM to </= 2 degrees and >/= 120 degrees with </= 1/10 pain for functional ROM required for efficent gait and ADLs    Period Weeks    Status Achieved      PT LONG TERM GOAL #2   Title pt to demo RLE strength to 5/5 in all planes to promote knee stability with standing/ walking and dynamic activities    Period Weeks    Status On-going      PT LONG TERM GOAL #3   Title pt to be able to perfor dynamic activities and jogging/ hopping with no report of pain or instability for personal goal of returning to playing basketball     Period Weeks    Status On-going      PT LONG TERM GOAL #4   Title increase FOTO score to </= 33% limited to demo improvement in function    Period Weeks    Status Achieved      PT LONG TERM GOAL #5   Title pt to be I with all HEP given  as of last visit to maintain and progress current level of  function independently.     Period Weeks    Status On-going                 Plan - 11/13/19 1512    Clinical Impression Statement pt reports increaed soreness secodnary to increaed activity performed over the weekend, but overall has been doing very well. he continues to make progress with knee ROM and hip/ knee strengthening. He is making good progress toward his LTG's.  continued working on strength utilizing BFR which he did well with. plan to continue seeing pt 1 x a week for th enext 4 weeks to continue working on balance and dynamic activities and work to discharge.    PT Frequency 1x / week    PT Duration 6 weeks    PT Treatment/Interventions ADLs/Self Care Home Management;Cryotherapy;Electrical Stimulation;Iontophoresis 4mg /ml Dexamethasone;Moist Heat;Ultrasound;Gait training;Stair training;Therapeutic activities;Therapeutic exercise;Balance training;Neuromuscular re-education;Manual techniques;Passive range of motion;Taping;Vasopneumatic Device;Patient/family education;Dry needling    PT Next Visit Plan review/ update HEP PRN, pt is 22 weeks post op on 11/12/2019, follow protocol, STW along distal hamstrings,TRX strengthening, balance, progress plyometric training, how was BFR?    PT Home Exercise Plan [id number] - quad set, SLR, hamstring stretch (supine and seated), heel slide with strap, wall squat, dead lift, ankle IR/ER, DF and PF    Consulted and Agree with Plan of Care Patient           Patient will benefit from skilled therapeutic intervention in order to improve the following deficits and impairments:  Improper body mechanics, Increased muscle spasms, Decreased strength, Abnormal gait, Pain, Postural dysfunction, Decreased endurance, Decreased activity tolerance, Decreased balance, Increased edema, Decreased range of motion  Visit Diagnosis: Chronic pain of left knee  Localized edema  Stiffness of left knee, not elsewhere classified  Muscle weakness  (generalized)     Problem List Patient Active Problem List   Diagnosis Date Noted  . Left varicocele 08/22/2018  . Right Pelvic Subcutaneous Nodule 08/22/2018  . Localized osteoarthritis of left knee 03/02/2018  . Annual physical exam 12/23/2016  . Acute meniscal tear of right knee 02/27/2015  . Right foot pain 01/19/2013   Starr Lake PT, DPT, LAT, ATC  11/13/19  5:10 PM      [facility] [address] [place], [place], [address] Phone: [phone]   Fax:  [phone]  Name: Joshua Gaines MRN: [id number] Date of Birth: 12-Dec-1980

## 2019-11-26 ENCOUNTER — Encounter: Payer: Self-pay | Admitting: Physical Therapy

## 2019-11-26 ENCOUNTER — Other Ambulatory Visit: Payer: Self-pay

## 2019-11-26 ENCOUNTER — Ambulatory Visit: Payer: 59 | Admitting: Physical Therapy

## 2019-11-26 DIAGNOSIS — M25662 Stiffness of left knee, not elsewhere classified: Secondary | ICD-10-CM

## 2019-11-26 DIAGNOSIS — G8929 Other chronic pain: Secondary | ICD-10-CM | POA: Diagnosis not present

## 2019-11-26 DIAGNOSIS — M25562 Pain in left knee: Secondary | ICD-10-CM

## 2019-11-26 DIAGNOSIS — M6281 Muscle weakness (generalized): Secondary | ICD-10-CM | POA: Diagnosis not present

## 2019-11-26 DIAGNOSIS — R600 Localized edema: Secondary | ICD-10-CM | POA: Diagnosis not present

## 2019-11-26 NOTE — Therapy (Addendum)
[place], [place], [address] Phone: [phone]   Fax:  [phone]  Physical Therapy Treatment / Discharge  Patient Details  Name: Joshua Gaines MRN: [id number] Date of Birth: 05-09-1980 Referring Provider (PT): Clayton Lefort MD   Encounter Date: 11/26/2019   PT End of Session - 11/26/19 1330    Visit Number 23    Number of Visits 26    Date for PT Re-Evaluation 12/25/19    PT Start Time 1330    PT Stop Time 1413    PT Time Calculation (min) 43 min    Activity Tolerance Patient tolerated treatment well    Behavior During Therapy [facility] for tasks assessed/performed           Past Medical History:  Diagnosis Date  . GERD (gastroesophageal reflux disease)   . PONV (postoperative nausea and vomiting)     Past Surgical History:  Procedure Laterality Date  . ANTERIOR CRUCIATE LIGAMENT REPAIR  [id number]  . CHONDROPLASTY Right 02/01/2018   Procedure: CHONDROPLASTY;  Surgeon: Hiram Gash, MD;  Location: [place];  Service: Orthopedics;  Laterality: Right;  . HARDWARE REMOVAL Right 02/01/2018   Procedure: HARDWARE REMOVAL;  Surgeon: Hiram Gash, MD;  Location: [place];  Service: Orthopedics;  Laterality: Right;  . HARDWARE REMOVAL Left 06/11/2019   Procedure: HARDWARE REMOVAL;  Surgeon: Hiram Gash, MD;  Location: [place];  Service: Orthopedics;  Laterality: Left;  . HERNIA REPAIR  aprox 2010 or 2013   right inguinal   . KNEE ARTHROSCOPY WITH ANTERIOR CRUCIATE LIGAMENT (ACL) REPAIR Left 06/11/2019   Procedure: KNEE ARTHROSCOPY WITH ANTERIOR CRUCIATE LIGAMENT (ACL) REPAIR WITH HAMSTRING AUTOGRAFT;  Surgeon: Hiram Gash, MD;  Location: [place];  Service: Orthopedics;  Laterality: Left;  . KNEE ARTHROSCOPY WITH ANTERIOR CRUCIATE LIGAMENT (ACL) REPAIR WITH HAMSTRING GRAFT Right 02/01/2018   Procedure: RIGHT KNEE ARTHROSCOPY WITH POSSIBLE  REVISION ANTERIOR CRUCIATE LIGAMENT (ACL) REPAIR WITH AUTOGRAFT HAMSTRING;  Surgeon: Hiram Gash, MD;  Location: [place];  Service: Orthopedics;  Laterality: Right;  . KNEE ARTHROSCOPY WITH LATERAL MENISECTOMY Right 02/01/2018   Procedure: RIGHT KNEE ARTHROSCOPY WITH LATERAL MENISECTOMY;  Surgeon: Hiram Gash, MD;  Location: [place];  Service: Orthopedics;  Laterality: Right;  . KNEE ARTHROSCOPY WITH MEDIAL MENISECTOMY Right 02/01/2018   Procedure: RIGHT KNEE ARTHROSCOPY WITH MEDIAL MENISECTOMY, CHONDROPLASTY;  Surgeon: Hiram Gash, MD;  Location: [place];  Service: Orthopedics;  Laterality: Right;  . MENISCUS REPAIR     left knee    There were no vitals filed for this visit.   Subjective Assessment - 11/26/19 1332    Subjective "I thought the BFR was good, but I did go hiking the next day and i was pretty sore after that."    Patient Stated Goals decrease pain, get back into shape, return to playing basketball    Currently in Pain? No/denies              [facility] PT Assessment - 11/26/19 0001      Assessment   Medical Diagnosis L knee revision ACL reconstruction with Auto graft hamstring and hardware removal    Referring Provider (PT) Clayton Lefort MD                         [facility] Adult PT Treatment/Exercise - 11/26/19 0001  Exercises   Other Exercises  BFR cuff size L - LLE LOP '@223mmHg'   80% 179 mmhg recommened sets/reps       Knee/Hip Exercises: Stretches   Active Hamstring Stretch 2 reps;30 seconds    Quad Stretch 2 reps;30 seconds    Gastroc Stretch 2 reps;30 seconds;Both   slant board     Knee/Hip Exercises: Aerobic   Tread Mill 1 min warm up at 1.5 , 6.0 x 5 min,       Knee/Hip Exercises: Machines for Strengthening   Cybex Knee Extension BFR at recommend reps/sets 10# LLE only unable to copmlete 6 reps on 4th set    Cybex Knee Flexion BFR at recommened reps sets LLE only 10#      Knee/Hip  Exercises: Plyometrics   Box Circuit 10 reps;Box Height: 6";2 sets   2nd set with reciprocal jump    Other Plyometric Exercises jump and stick onto bosu 5 x forward/ 5 x L/R bil LE    Other Plyometric Exercises 25% of max speed carioca 4 x 30 ft               Balance Exercises - 11/26/19 0001      Balance Exercises: Standing   Rebounder Foam/compliant surface;Single leg;20 reps   facing forward, then L/R with blue ball              PT Short Term Goals - 08/07/19 1601      PT SHORT TERM GOAL #1   Title pt to be I with inital HEp     Period Weeks    Status Achieved      PT SHORT TERM GOAL #2   Title pt to verbalize and demo techniques to reduce pain and inflammation via RICE and HEP    Period Weeks    Status Achieved      PT SHORT TERM GOAL #3   Title pt to demo good quad activation and SLR with </= 5 degree quad lag for functional progression    Period Weeks    Status Achieved      PT SHORT TERM GOAL #4   Title pt to increase knee flexion to >/= 110 degrees with </= 4/10 pain for therapuetic progression    Period Weeks    Status Achieved             PT Long Term Goals - 11/13/19 1518      PT LONG TERM GOAL #1   Title increase ROM to </= 2 degrees and >/= 120 degrees with </= 1/10 pain for functional ROM required for efficent gait and ADLs    Period Weeks    Status Achieved      PT LONG TERM GOAL #2   Title pt to demo RLE strength to 5/5 in all planes to promote knee stability with standing/ walking and dynamic activities    Period Weeks    Status On-going      PT LONG TERM GOAL #3   Title pt to be able to perfor dynamic activities and jogging/ hopping with no report of pain or instability for personal goal of returning to playing basketball     Period Weeks    Status On-going      PT LONG TERM GOAL #4   Title increase FOTO score to </= 33% limited to demo improvement in function    Period Weeks    Status Achieved      PT LONG TERM GOAL #5  Title pt to be I with all HEP given as of last visit to maintain and progress current level of function independently.     Period Weeks    Status On-going                 Plan - 11/26/19 1348    Clinical Impression Statement continued working balance training and dynamic jumping on both stable and unstable surfaces. He was did well with static and dynamic balance training but fatigues quickly. continued using BFR to promote knee strengthening which he did very well with.    PT Treatment/Interventions ADLs/Self Care Home Management;Cryotherapy;Electrical Stimulation;Iontophoresis 13m/ml Dexamethasone;Moist Heat;Ultrasound;Gait training;Stair training;Therapeutic activities;Therapeutic exercise;Balance training;Neuromuscular re-education;Manual techniques;Passive range of motion;Taping;Vasopneumatic Device;Patient/family education;Dry needling    PT Next Visit Plan review/ update HEP PRN, pt is 24 weeks post op on 11/26/2019, follow protocol, STW along distal hamstrings,TRX strengthening, balance, progress plyometric training, how was BFR?    PT Home Exercise Plan [id number]- quad set, SLR, hamstring stretch (supine and seated), heel slide with strap, wall squat, dead lift, ankle IR/ER, DF and PF    Consulted and Agree with Plan of Care Patient           Patient will benefit from skilled therapeutic intervention in order to improve the following deficits and impairments:  Improper body mechanics, Increased muscle spasms, Decreased strength, Abnormal gait, Pain, Postural dysfunction, Decreased endurance, Decreased activity tolerance, Decreased balance, Increased edema, Decreased range of motion  Visit Diagnosis: Chronic pain of left knee  Localized edema  Stiffness of left knee, not elsewhere classified  Muscle weakness (generalized)     Problem List Patient Active Problem List   Diagnosis Date Noted  . Left varicocele 08/22/2018  . Right Pelvic Subcutaneous Nodule 08/22/2018  .  Localized osteoarthritis of left knee 03/02/2018  . Annual physical exam 12/23/2016  . Acute meniscal tear of right knee 02/27/2015  . Right foot pain 01/19/2013   KStarr LakePT, DPT, LAT, ATC  11/26/19  2:18 PM      [facility] [address]Phone: [phone]  Fax:  [phone] Name: Joshua REHFELDTMRN: [id number] of Birth: 9July 05, 1982     PHYSICAL THERAPY DISCHARGE SUMMARY  Visits from Start of Care: 23  Current functional level related to goals / functional outcomes: See goals, FOTO 1% limited   Remaining deficits: No limitations / issues   Education / Equipment: HEP, theraband, posture, jump/ landing training,   Plan: Patient agrees to discharge.  Patient goals were met. Patient is being discharged due to being pleased with the current functional level.  ?????        Keilyn Nadal PT, DPT, LAT, ATC  01/03/20  1:13 PM

## 2019-11-28 IMAGING — MR MR KNEE*L* W/O CM
7 series · 40 of 40 positions shown · non-contrast
Comparison: MRI left knee 03/03/2015.

CLINICAL DATA: Left knee pain and locking since a fall playing
basketball 6 months ago. History of prior surgery.

EXAM:
MRI OF THE LEFT KNEE WITHOUT CONTRAST
TECHNIQUE: Multiplanar, multisequence MR imaging of the knee was performed. No
intravenous contrast was administered.

[Series 3: T2 fat-sat · axial · 4.0mm · 0.53mm/px · z∈[-120,+50]mm · 6 of 35 slices shown (1 of 3)]
[im 1/35]
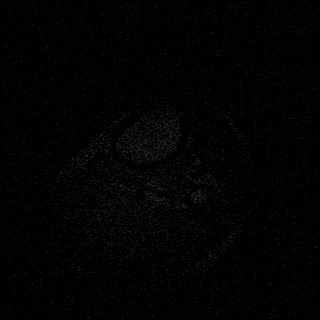
[im 7/35]
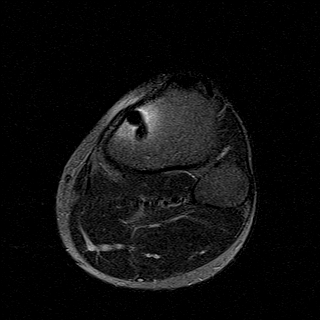
[im 14/35]
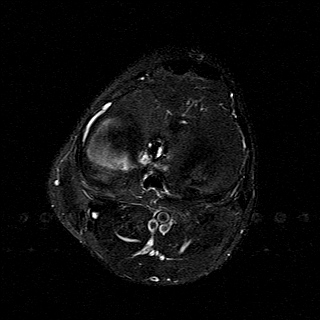
[im 21/35]
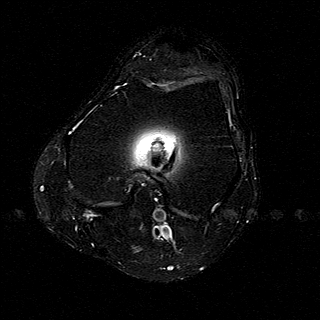
[im 28/35]
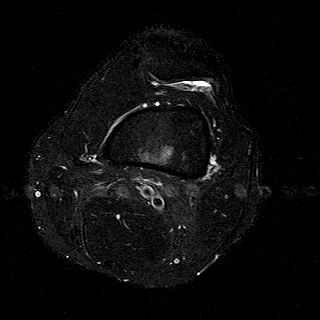
[im 35/35]
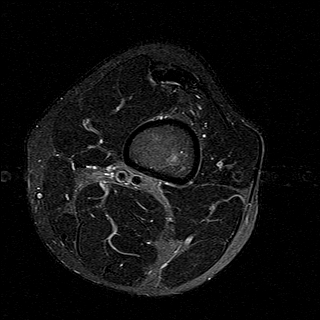

[Series 4: T1 · coronal · 4.0mm · 0.62mm/px · 6 of 31 slices shown]
[im 1/31]
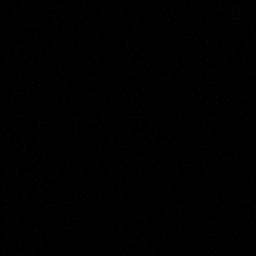
[im 7/31]
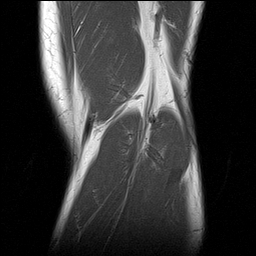
[im 13/31]
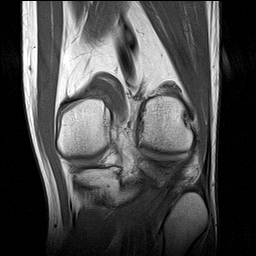
[im 19/31]
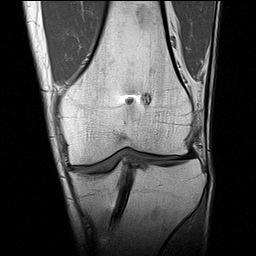
[im 25/31]
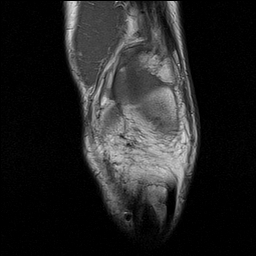
[im 31/31]
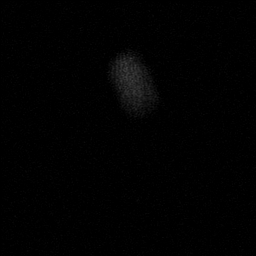

[Series 5: T2 fat-sat · coronal · 4.0mm · 0.62mm/px · 6 of 31 slices shown (2 of 3)]
[im 1/31]
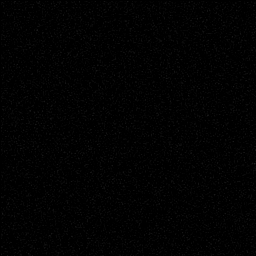
[im 7/31]
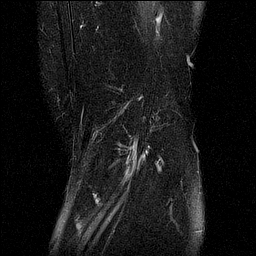
[im 13/31]
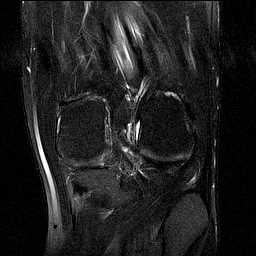
[im 19/31]
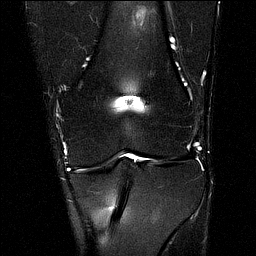
[im 25/31]
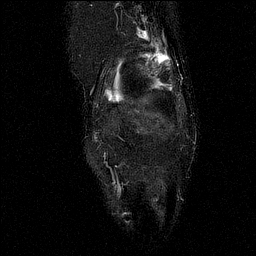
[im 31/31]
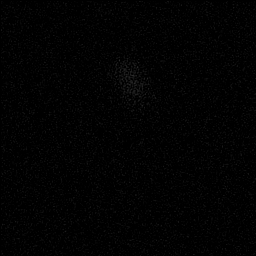

[Series 6: PD fat-sat · coronal · 4.0mm · 0.62mm/px · 6 of 31 slices shown (1 of 3)]
[im 1/31]
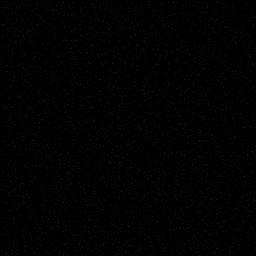
[im 7/31]
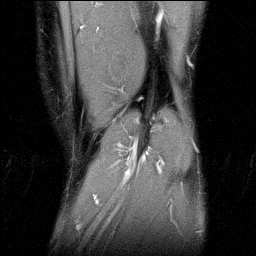
[im 13/31]
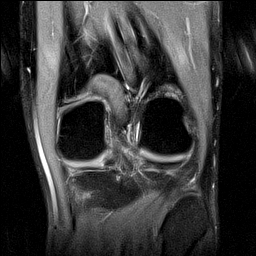
[im 19/31]
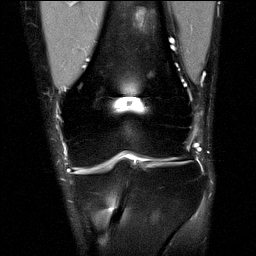
[im 25/31]
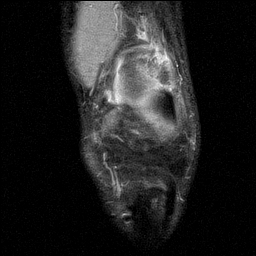
[im 31/31]
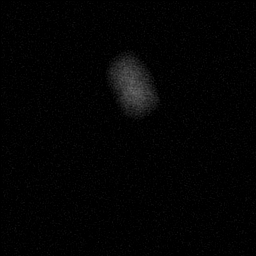

[Series 7: PD fat-sat · sagittal · 3.0mm · 0.62mm/px · 6 of 35 slices shown (2 of 3)]
[im 1/35]
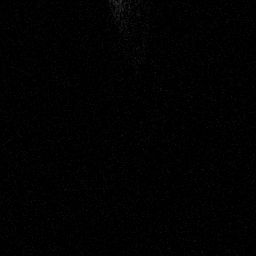
[im 7/35]
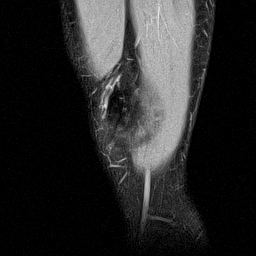
[im 14/35]
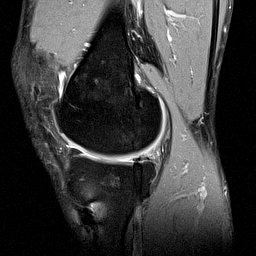
[im 21/35]
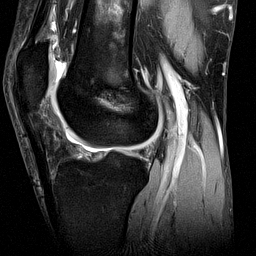
[im 28/35]
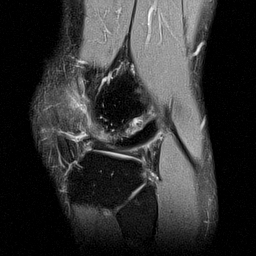
[im 35/35]
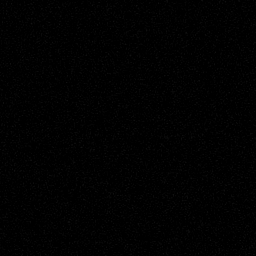

[Series 8: T2 fat-sat · sagittal · 3.0mm · 0.62mm/px · 6 of 35 slices shown (3 of 3)]
[im 1/35]
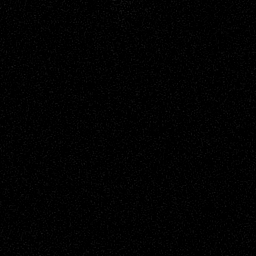
[im 7/35]
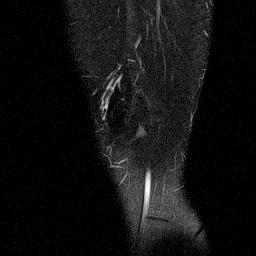
[im 14/35]
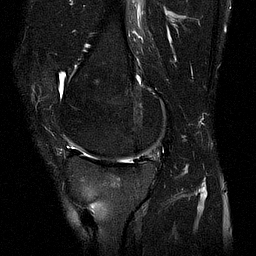
[im 21/35]
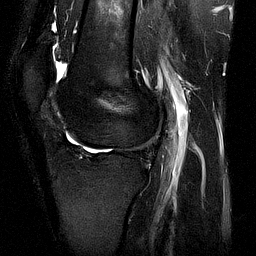
[im 28/35]
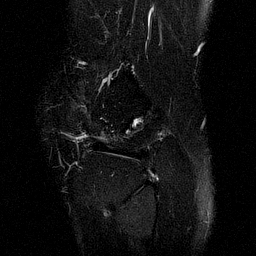
[im 35/35]
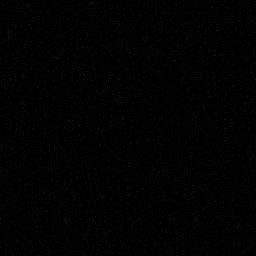

[Series 9: PD fat-sat · coronal · 2.0mm · 0.62mm/px · 4 of 19 slices shown (3 of 3)]
[im 1/19]
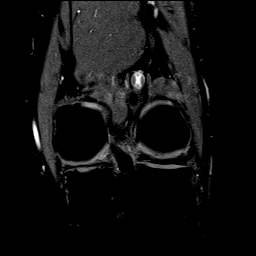
[im 7/19]
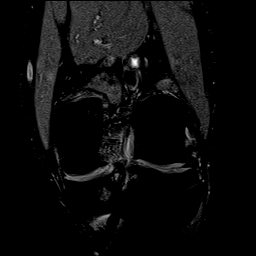
[im 13/19]
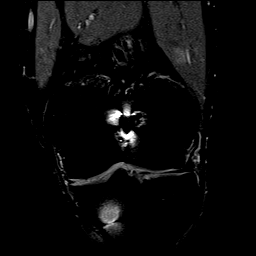
[im 19/19]
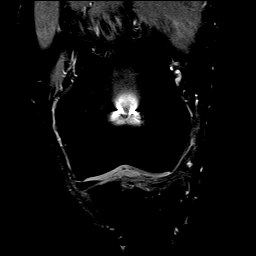

[40 of 40 positions shown; findings below may reference images not displayed]

FINDINGS: MENISCI

Medial meniscus: The posterior horn and body are markedly diminutive
consistent with prior meniscectomy. No tear is identified. There is
intrasubstance increased T2 signal in the body of the medial
meniscus consistent with degeneration, new since the prior exam.

Lateral meniscus:  Intact.

LIGAMENTS

Cruciates: The patient is status post ACL grafting. The graft is
intact with some mucoid degeneration identified. The appearance is
not grossly changed.

Collaterals:  Intact.

CARTILAGE

Patellofemoral: Cartilage thinning about the femoral trochlea is
most notable centrally and medially and appears unchanged.

Medial: Cartilage thinning and irregularity of the cartilage surface
are again seen and have mildly progressed since the prior
examination.

Lateral:  Mildly degenerated without focal lesion.

Joint:  Trace amount of joint fluid.

Popliteal Fossa:  No Baker's cyst.

Extensor Mechanism: Intact. Defect in the patellar tendon for ACL
grafting noted.

Bones: No fracture or worrisome lesion. Osteophytosis about the knee
has not markedly changed compared to the prior exam.

Other: None.
IMPRESSION: Negative for meniscal or ligament tear.  No acute abnormality.

Status post ACL grafting. Mucoid degeneration of the graft does not
appear changed compared to the prior examination.

Diminutive posterior horn and body of the medial meniscus consistent
with prior meniscectomy. There has been some degeneration of the
body of the medial meniscus since the prior MRI but no tear is
identified.

Degenerative disease about the knee is somewhat advanced for age and
shows mild progression medially since the prior exam.

## 2019-12-04 ENCOUNTER — Ambulatory Visit: Payer: 59 | Admitting: Physical Therapy

## 2020-01-08 ENCOUNTER — Telehealth: Payer: Self-pay | Admitting: Sports Medicine

## 2020-01-08 ENCOUNTER — Other Ambulatory Visit: Payer: Self-pay

## 2020-01-08 ENCOUNTER — Ambulatory Visit (INDEPENDENT_AMBULATORY_CARE_PROVIDER_SITE_OTHER): Payer: 59 | Admitting: Sports Medicine

## 2020-01-08 ENCOUNTER — Encounter: Payer: Self-pay | Admitting: Sports Medicine

## 2020-01-08 VITALS — BP 121/81 | HR 58 | Ht 73.0 in | Wt 191.0 lb

## 2020-01-08 DIAGNOSIS — Z0000 Encounter for general adult medical examination without abnormal findings: Secondary | ICD-10-CM | POA: Diagnosis not present

## 2020-01-08 DIAGNOSIS — M174 Other bilateral secondary osteoarthritis of knee: Secondary | ICD-10-CM

## 2020-01-08 DIAGNOSIS — Z23 Encounter for immunization: Secondary | ICD-10-CM | POA: Diagnosis not present

## 2020-01-08 NOTE — Assessment & Plan Note (Signed)
Joshua Gaines is a very pleasant 39 year old male, we have been treating him for a long time for both of his knees, he has a history of ACL reconstructions, it is certain for osteoarthritis to develop after ACL reconstructions, he has also had several partial meniscectomies, we have had to do several manipulations to unlock his knees over time. He has been working with Dr. Griffin Basil, he does need an arthroscopy on his right knee, MRI did show intra-articular loose bodies and he does get clinical locking. I explained the chondrotoxic nature of intra-articular loose bodies when they move between the weightbearing surface, he plans to get his arthroscopy. Because he does have osteoarthritis, we likely will need to do viscosupplementation after the arthroscopy and after the structural abnormalities are corrected. I can see him back on an as-needed basis, but likely several weeks to a month after his scope to start viscosupplementation.

## 2020-01-08 NOTE — Assessment & Plan Note (Signed)
Annual physical as above, checking routine labs, return to see me in 1 year for this.

## 2020-01-08 NOTE — Progress Notes (Signed)
Subjective:    CC: Annual Physical Exam  HPI:  This patient is here for their annual physical  I reviewed the past medical history, family history, social history, surgical history, and allergies today and no changes were needed.  Please see the problem list section below in epic for further details.  Past Medical History: Past Medical History:  Diagnosis Date  . GERD (gastroesophageal reflux disease)   . PONV (postoperative nausea and vomiting)    Past Surgical History: Past Surgical History:  Procedure Laterality Date  . ANTERIOR CRUCIATE LIGAMENT REPAIR  [id number]  . CHONDROPLASTY Right 02/01/2018   Procedure: CHONDROPLASTY;  Surgeon: Hiram Gash, MD;  Location: [place];  Service: Orthopedics;  Laterality: Right;  . HARDWARE REMOVAL Right 02/01/2018   Procedure: HARDWARE REMOVAL;  Surgeon: Hiram Gash, MD;  Location: [place];  Service: Orthopedics;  Laterality: Right;  . HARDWARE REMOVAL Left 06/11/2019   Procedure: HARDWARE REMOVAL;  Surgeon: Hiram Gash, MD;  Location: [place];  Service: Orthopedics;  Laterality: Left;  . HERNIA REPAIR  aprox 2010 or 2013   right inguinal   . KNEE ARTHROSCOPY WITH ANTERIOR CRUCIATE LIGAMENT (ACL) REPAIR Left 06/11/2019   Procedure: KNEE ARTHROSCOPY WITH ANTERIOR CRUCIATE LIGAMENT (ACL) REPAIR WITH HAMSTRING AUTOGRAFT;  Surgeon: Hiram Gash, MD;  Location: [place];  Service: Orthopedics;  Laterality: Left;  . KNEE ARTHROSCOPY WITH ANTERIOR CRUCIATE LIGAMENT (ACL) REPAIR WITH HAMSTRING GRAFT Right 02/01/2018   Procedure: RIGHT KNEE ARTHROSCOPY WITH POSSIBLE REVISION ANTERIOR CRUCIATE LIGAMENT (ACL) REPAIR WITH AUTOGRAFT HAMSTRING;  Surgeon: Hiram Gash, MD;  Location: [place];  Service: Orthopedics;  Laterality: Right;  . KNEE ARTHROSCOPY WITH LATERAL MENISECTOMY Right 02/01/2018   Procedure: RIGHT KNEE ARTHROSCOPY WITH LATERAL MENISECTOMY;  Surgeon:  Hiram Gash, MD;  Location: [place];  Service: Orthopedics;  Laterality: Right;  . KNEE ARTHROSCOPY WITH MEDIAL MENISECTOMY Right 02/01/2018   Procedure: RIGHT KNEE ARTHROSCOPY WITH MEDIAL MENISECTOMY, CHONDROPLASTY;  Surgeon: Hiram Gash, MD;  Location: [place];  Service: Orthopedics;  Laterality: Right;  . MENISCUS REPAIR     left knee   Social History: Social History   Socioeconomic History  . Marital status: Married    Spouse name: Not on file  . Number of children: Not on file  . Years of education: Not on file  . Highest education level: Not on file  Occupational History  . Not on file  Tobacco Use  . Smoking status: Never Smoker  . Smokeless tobacco: Never Used  Vaping Use  . Vaping Use: Never used  Substance and Sexual Activity  . Alcohol use: No  . Drug use: No  . Sexual activity: Yes    Partners: Female  Other Topics Concern  . Not on file  Social History Narrative  . Not on file   Social Determinants of Health   Financial Resource Strain:   . Difficulty of Paying Living Expenses: Not on file  Food Insecurity:   . Worried About Charity fundraiser in the Last Year: Not on file  . Ran Out of Food in the Last Year: Not on file  Transportation Needs:   . Lack of Transportation (Medical): Not on file  . Lack of Transportation (Non-Medical): Not on file  Physical Activity:   . Days of Exercise per Week: Not on file  . Minutes of Exercise per Session: Not on file  Stress:   .  Feeling of Stress : Not on file  Social Connections:   . Frequency of Communication with Friends and Family: Not on file  . Frequency of Social Gatherings with Friends and Family: Not on file  . Attends Religious Services: Not on file  . Active Member of Clubs or Organizations: Not on file  . Attends Archivist Meetings: Not on file  . Marital Status: Not on file   Family History: No family history on file. Allergies: No Known  Allergies Medications: See med rec.  Review of Systems: No headache, visual changes, nausea, vomiting, diarrhea, constipation, dizziness, abdominal pain, skin rash, fevers, chills, night sweats, swollen lymph nodes, weight loss, chest pain, body aches, joint swelling, muscle aches, shortness of breath, mood changes, visual or auditory hallucinations.  Objective:    General: Well Developed, well nourished, and in no acute distress.  Neuro: Alert and oriented x3, extra-ocular muscles intact, sensation grossly intact. Cranial nerves II through XII are intact, motor, sensory, and coordinative functions are all intact. HEENT: Normocephalic, atraumatic, pupils equal round reactive to light, neck supple, no masses, no lymphadenopathy, thyroid nonpalpable. Oropharynx, nasopharynx, external ear canals are unremarkable. Skin: Warm and dry, no rashes noted.  Cardiac: Regular rate and rhythm, no murmurs rubs or gallops.  Respiratory: Clear to auscultation bilaterally. Not using accessory muscles, speaking in full sentences.  Abdominal: Soft, nontender, nondistended, positive bowel sounds, no masses, no organomegaly.  Musculoskeletal: Shoulder, elbow, wrist, hip, knee, ankle stable, and with full range of motion.  Impression and Recommendations:    The patient was counselled, risk factors were discussed, anticipatory guidance given.  Posttraumatic osteoarthritis of knee Joshua Gaines is a very pleasant 39 year old male, we have been treating him for a long time for both of his knees, he has a history of ACL reconstructions, it is certain for osteoarthritis to develop after ACL reconstructions, he has also had several partial meniscectomies, we have had to do several manipulations to unlock his knees over time. He has been working with Dr. Griffin Basil, he does need an arthroscopy on his right knee, MRI did show intra-articular loose bodies and he does get clinical locking. I explained the chondrotoxic nature of  intra-articular loose bodies when they move between the weightbearing surface, he plans to get his arthroscopy. Because he does have osteoarthritis, we likely will need to do viscosupplementation after the arthroscopy and after the structural abnormalities are corrected. I can see him back on an as-needed basis, but likely several weeks to a month after his scope to start viscosupplementation.  Annual physical exam Annual physical as above, checking routine labs, return to see me in 1 year for this.   ___________________________________________ Gwen Her. Dianah Field, M.D., ABFM., CAQSM. Primary Care and Sports Medicine [facility]  Adjunct Professor of [employer]

## 2020-01-08 NOTE — Telephone Encounter (Signed)
Bilateral knee osteoarthritis, x-ray confirmed, failed multiple injections, arthroscopies, Orthovisc approval bilateral please.

## 2020-01-09 LAB — CBC
HCT: 46.8 % (ref 38.5–50.0)
Hemoglobin: 16.3 g/dL (ref 13.2–17.1)
MCH: 32.9 pg (ref 27.0–33.0)
MCHC: 34.8 g/dL (ref 32.0–36.0)
MCV: 94.5 fL (ref 80.0–100.0)
MPV: 10.5 fL (ref 7.5–12.5)
Platelets: 153 10*3/uL (ref 140–400)
RBC: 4.95 10*6/uL (ref 4.20–5.80)
RDW: 12.6 % (ref 11.0–15.0)
WBC: 4.6 10*3/uL (ref 3.8–10.8)

## 2020-01-09 LAB — COMPLETE METABOLIC PANEL WITH GFR
AG Ratio: 2.4 (calc) (ref 1.0–2.5)
ALT: 15 U/L (ref 9–46)
AST: 20 U/L (ref 10–40)
Albumin: 4.8 g/dL (ref 3.6–5.1)
Alkaline phosphatase (APISO): 58 U/L (ref 36–130)
BUN: 15 mg/dL (ref 7–25)
CO2: 31 mmol/L (ref 20–32)
Calcium: 9.9 mg/dL (ref 8.6–10.3)
Chloride: 104 mmol/L (ref 98–110)
Creat: 1.13 mg/dL (ref 0.60–1.35)
GFR, Est African American: 94 mL/min/{1.73_m2} (ref >=60)
GFR, Est Non African American: 81 mL/min/{1.73_m2} (ref >=60)
Globulin: 2 g/dL (calc) (ref 1.9–3.7)
Glucose, Bld: 88 mg/dL (ref 65–99)
Potassium: 4.2 mmol/L (ref 3.5–5.3)
Sodium: 141 mmol/L (ref 135–146)
Total Bilirubin: 0.9 mg/dL (ref 0.2–1.2)
Total Protein: 6.8 g/dL (ref 6.1–8.1)

## 2020-01-09 LAB — LIPID PANEL W/REFLEX DIRECT LDL
Cholesterol: 179 mg/dL (ref <200)
HDL: 50 mg/dL (ref >=40)
LDL Cholesterol (Calc): 112 mg/dL (calc) — ABNORMAL HIGH
Non-HDL Cholesterol (Calc): 129 mg/dL (calc) (ref <130)
Total CHOL/HDL Ratio: 3.6 (calc) (ref <5.0)
Triglycerides: 82 mg/dL (ref <150)

## 2020-01-09 LAB — TSH: TSH: 1.93 mIU/L (ref 0.40–4.50)

## 2020-01-09 NOTE — Telephone Encounter (Signed)
Started Orthovisc process waiting on BID to see if PA is required. - CF

## 2020-01-28 NOTE — Telephone Encounter (Signed)
Received Benefits investigation Patient is covered at 100 % but medication required PA sent through cover my meds and received authorization. .   Valid 01/24/2020 - 3/22/201 for up to 16 ML 8 syringes for 1 fill.   Renewal requires that it has been at least 6 months since the last treatment on the same knee.   I gave patient information to Janett Billow to schedule patient is buy and bill. - CF

## 2020-01-30 ENCOUNTER — Telehealth: Payer: Self-pay | Admitting: Sports Medicine

## 2020-01-30 ENCOUNTER — Other Ambulatory Visit: Payer: Self-pay

## 2020-01-30 ENCOUNTER — Encounter (HOSPITAL_BASED_OUTPATIENT_CLINIC_OR_DEPARTMENT_OTHER): Payer: Self-pay | Admitting: Orthopaedic Surgery

## 2020-01-30 NOTE — Telephone Encounter (Signed)
Left voicemail for a 4 week bilateral knee Orthovisc injection to be scheduled for patient.

## 2020-02-01 NOTE — H&P (Signed)
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 male who is scheduled for ARTHROSCOPY KNEE REMOVAL OF LOOSE FOREIGN BODY, AND DEBRIDEMENT/SHAVING CHONDROPLASTY.   Patient has a past medical history significant for PONV and GERD.   Patient underwent a right knee scope with revision ACL reconstruction on 02/01/2018 for right knee with recurrent symptomatic instability and increased translation on Lachman after ACL reconstruction with both medial and lateral meniscus tears and mechanical symptoms. He did well after surgery. He continues to feels a catch and lock in the  lateral aspect of his knee.   His symptoms are rated as moderate to severe, and have been worsening.  This is significantly impairing activities of daily living.    Please see clinic note for further details on this patient's care.    He has elected for surgical management.   Past Medical History:  Diagnosis Date  . GERD (gastroesophageal reflux disease)   . PONV (postoperative nausea and vomiting)    Past Surgical History:  Procedure Laterality Date  . ANTERIOR CRUCIATE LIGAMENT REPAIR  [id number]  . CHONDROPLASTY Right 02/01/2018   Procedure: CHONDROPLASTY;  Surgeon: Hiram Gash, MD;  Location: [place];  Service: Orthopedics;  Laterality: Right;  . HARDWARE REMOVAL Right 02/01/2018   Procedure: HARDWARE REMOVAL;  Surgeon: Hiram Gash, MD;  Location: [place];  Service: Orthopedics;  Laterality: Right;  . HARDWARE REMOVAL Left 06/11/2019   Procedure: HARDWARE REMOVAL;  Surgeon: Hiram Gash, MD;  Location: [place];  Service: Orthopedics;  Laterality: Left;  . HERNIA REPAIR  aprox 2010 or 2013   right inguinal   . KNEE ARTHROSCOPY WITH ANTERIOR CRUCIATE LIGAMENT (ACL) REPAIR Left 06/11/2019   Procedure: KNEE ARTHROSCOPY WITH ANTERIOR CRUCIATE LIGAMENT (ACL) REPAIR WITH HAMSTRING AUTOGRAFT;   Surgeon: Hiram Gash, MD;  Location: [place];  Service: Orthopedics;  Laterality: Left;  . KNEE ARTHROSCOPY WITH ANTERIOR CRUCIATE LIGAMENT (ACL) REPAIR WITH HAMSTRING GRAFT Right 02/01/2018   Procedure: RIGHT KNEE ARTHROSCOPY WITH POSSIBLE REVISION ANTERIOR CRUCIATE LIGAMENT (ACL) REPAIR WITH AUTOGRAFT HAMSTRING;  Surgeon: Hiram Gash, MD;  Location: [place];  Service: Orthopedics;  Laterality: Right;  . KNEE ARTHROSCOPY WITH LATERAL MENISECTOMY Right 02/01/2018   Procedure: RIGHT KNEE ARTHROSCOPY WITH LATERAL MENISECTOMY;  Surgeon: Hiram Gash, MD;  Location: [place];  Service: Orthopedics;  Laterality: Right;  . KNEE ARTHROSCOPY WITH MEDIAL MENISECTOMY Right 02/01/2018   Procedure: RIGHT KNEE ARTHROSCOPY WITH MEDIAL MENISECTOMY, CHONDROPLASTY;  Surgeon: Hiram Gash, MD;  Location: [place];  Service: Orthopedics;  Laterality: Right;  . MENISCUS REPAIR     left knee   Social History   Socioeconomic History  . Marital status: Married    Spouse name: Not on file  . Number of children: Not on file  . Years of education: Not on file  . Highest education level: Not on file  Occupational History  . Not on file  Tobacco Use  . Smoking status: Never Smoker  . Smokeless tobacco: Never Used  Vaping Use  . Vaping Use: Never used  Substance and Sexual Activity  . Alcohol use: No  . Drug use: No  . Sexual activity: Yes    Partners: Female  Other Topics Concern  . Not on file  Social History Narrative  . Not on file   Social Determinants of  Health   Financial Resource Strain:   . Difficulty of Paying Living Expenses: Not on file  Food Insecurity:   . Worried About Charity fundraiser in the Last Year: Not on file  . Ran Out of Food in the Last Year: Not on file  Transportation Needs:   . Lack of Transportation (Medical): Not on file  . Lack of Transportation (Non-Medical): Not on file  Physical Activity:   .  Days of Exercise per Week: Not on file  . Minutes of Exercise per Session: Not on file  Stress:   . Feeling of Stress : Not on file  Social Connections:   . Frequency of Communication with Friends and Family: Not on file  . Frequency of Social Gatherings with Friends and Family: Not on file  . Attends Religious Services: Not on file  . Active Member of Clubs or Organizations: Not on file  . Attends Archivist Meetings: Not on file  . Marital Status: Not on file   History reviewed. No pertinent family history. No Known Allergies Prior to Admission medications   Not on File    ROS: All other systems have been reviewed and were otherwise negative with the exception of those mentioned in the HPI and as above.  Physical Exam: General: Alert, no acute distress Cardiovascular: No pedal edema Respiratory: No cyanosis, no use of accessory musculature GI: No organomegaly, abdomen is soft and non-tender Skin: No lesions in the area of chief complaint Neurologic: Sensation intact distally Psychiatric: Patient is competent for consent with normal mood and affect Lymphatic: No axillary or cervical lymphadenopathy  MUSCULOSKELETAL:  The bilateral knees have good endpoints on Lachman. There are warm well perfused extremities distally. No joint line tenderness on the right. He does report a catch laterally in the patellofemoral area in the anterolateral part of the knee  Imaging: MRI of right knee showing stable postsurgical changes. No recurrent meniscal tear. Evidence of loose bodies.   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   The patient acknowledged the explanation, agreed to proceed with the plan and consent was signed.   Operative Plan: Right knee scope with chondroplasty and loose body excision Discharge Medications: Tylenol, Meloxicam, Omeprazole, Zofran, Oxycodone     DVT Prophylaxis: Aspirin Physical Therapy: Outpatient PT Special Discharge needs: Knee immobilizer if receives a block   [employer], PA-C  02/01/2020 2:12 PM

## 2020-02-04 ENCOUNTER — Other Ambulatory Visit (HOSPITAL_COMMUNITY)
Admission: RE | Admit: 2020-02-04 | Discharge: 2020-02-04 | Disposition: A | Discharge status: Home / Self Care | Payer: 59 | Source: Ambulatory Visit | Attending: Orthopaedic Surgery | Admitting: Orthopaedic Surgery

## 2020-02-04 DIAGNOSIS — Z20822 Contact with and (suspected) exposure to covid-19: Secondary | ICD-10-CM | POA: Insufficient documentation

## 2020-02-04 DIAGNOSIS — Z01812 Encounter for preprocedural laboratory examination: Secondary | ICD-10-CM | POA: Insufficient documentation

## 2020-02-04 LAB — SARS CORONAVIRUS 2 (TAT 6-24 HRS): SARS Coronavirus 2: NEGATIVE

## 2020-02-04 NOTE — Progress Notes (Signed)

## 2020-02-07 ENCOUNTER — Ambulatory Visit (HOSPITAL_BASED_OUTPATIENT_CLINIC_OR_DEPARTMENT_OTHER)
Admission: RE | Admit: 2020-02-07 | Discharge: 2020-02-07 | Disposition: A | Discharge status: Home / Self Care | Payer: 59 | Source: Ambulatory Visit | Attending: Orthopaedic Surgery | Admitting: Orthopaedic Surgery

## 2020-02-07 ENCOUNTER — Other Ambulatory Visit: Payer: Self-pay

## 2020-02-07 ENCOUNTER — Ambulatory Visit (HOSPITAL_BASED_OUTPATIENT_CLINIC_OR_DEPARTMENT_OTHER): Payer: 59 | Admitting: Certified Registered"

## 2020-02-07 ENCOUNTER — Encounter (HOSPITAL_BASED_OUTPATIENT_CLINIC_OR_DEPARTMENT_OTHER)
Admission: RE | Disposition: A | Discharge status: Home / Self Care | Payer: Self-pay | Source: Ambulatory Visit | Attending: Orthopaedic Surgery

## 2020-02-07 ENCOUNTER — Other Ambulatory Visit (HOSPITAL_BASED_OUTPATIENT_CLINIC_OR_DEPARTMENT_OTHER): Payer: Self-pay | Admitting: Physician Assistant

## 2020-02-07 ENCOUNTER — Encounter (HOSPITAL_BASED_OUTPATIENT_CLINIC_OR_DEPARTMENT_OTHER): Payer: Self-pay | Admitting: Orthopaedic Surgery

## 2020-02-07 DIAGNOSIS — K219 Gastro-esophageal reflux disease without esophagitis: Secondary | ICD-10-CM | POA: Diagnosis not present

## 2020-02-07 DIAGNOSIS — M2341 Loose body in knee, right knee: Secondary | ICD-10-CM | POA: Insufficient documentation

## 2020-02-07 DIAGNOSIS — M23251 Derangement of posterior horn of lateral meniscus due to old tear or injury, right knee: Secondary | ICD-10-CM | POA: Diagnosis not present

## 2020-02-07 DIAGNOSIS — M2241 Chondromalacia patellae, right knee: Secondary | ICD-10-CM | POA: Diagnosis not present

## 2020-02-07 DIAGNOSIS — S83281A Other tear of lateral meniscus, current injury, right knee, initial encounter: Secondary | ICD-10-CM | POA: Diagnosis not present

## 2020-02-07 DIAGNOSIS — M1730 Unilateral post-traumatic osteoarthritis, unspecified knee: Secondary | ICD-10-CM | POA: Diagnosis not present

## 2020-02-07 HISTORY — PX: CHONDROPLASTY: SHX5177

## 2020-02-07 HISTORY — PX: KNEE ARTHROSCOPY WITH LATERAL MENISECTOMY: SHX6193

## 2020-02-07 SURGERY — ARTHROSCOPY, KNEE, WITH LATERAL MENISCECTOMY
Anesthesia: General | Site: Knee | Laterality: Right

## 2020-02-07 MED ORDER — LACTATED RINGERS IV SOLN: INTRAVENOUS | Status: DC

## 2020-02-07 MED ORDER — OXYCODONE HCL 5 MG/5ML PO SOLN: 5.0000 mg | Freq: Once | ORAL | Status: DC | PRN

## 2020-02-07 MED ORDER — LIDOCAINE 2% (20 MG/ML) 5 ML SYRINGE
INTRAMUSCULAR | Status: AC
  Filled 2020-02-07: qty 5

## 2020-02-07 MED ORDER — ACETAMINOPHEN 500 MG PO TABS: 1000.0000 mg | ORAL_TABLET | Freq: Three times a day (TID) | ORAL | 0 refills | Status: DC

## 2020-02-07 MED ORDER — SODIUM CHLORIDE 0.9 % IR SOLN
Status: DC | PRN
  Administered 2020-02-07: 4500 mL

## 2020-02-07 MED ORDER — MELOXICAM 7.5 MG PO TABS: 7.5000 mg | ORAL_TABLET | Freq: Every day | ORAL | 0 refills | Status: DC

## 2020-02-07 MED ORDER — CEFAZOLIN SODIUM-DEXTROSE 2-4 GM/100ML-% IV SOLN
2.0000 g | INTRAVENOUS | Status: AC
  Administered 2020-02-07: 2 g via INTRAVENOUS

## 2020-02-07 MED ORDER — CEFAZOLIN SODIUM-DEXTROSE 2-4 GM/100ML-% IV SOLN
INTRAVENOUS | Status: AC
  Filled 2020-02-07: qty 100

## 2020-02-07 MED ORDER — ONDANSETRON HCL 4 MG/2ML IJ SOLN
INTRAMUSCULAR | Status: AC
  Filled 2020-02-07: qty 2

## 2020-02-07 MED ORDER — ONDANSETRON HCL 4 MG/2ML IJ SOLN
INTRAMUSCULAR | Status: DC | PRN
  Administered 2020-02-07: 4 mg via INTRAVENOUS

## 2020-02-07 MED ORDER — LIDOCAINE HCL (CARDIAC) PF 100 MG/5ML IV SOSY
PREFILLED_SYRINGE | INTRAVENOUS | Status: DC | PRN
  Administered 2020-02-07: 60 mg via INTRAVENOUS

## 2020-02-07 MED ORDER — HYDROMORPHONE HCL 1 MG/ML IJ SOLN
0.2500 mg | INTRAMUSCULAR | Status: DC | PRN
  Administered 2020-02-07: 0.5 mg via INTRAVENOUS
  Administered 2020-02-07: 0.25 mg via INTRAVENOUS

## 2020-02-07 MED ORDER — PROMETHAZINE HCL 25 MG/ML IJ SOLN: 6.2500 mg | INTRAMUSCULAR | Status: DC | PRN

## 2020-02-07 MED ORDER — OXYCODONE HCL 5 MG PO TABS: ORAL_TABLET | ORAL | 0 refills | Status: DC

## 2020-02-07 MED ORDER — DEXAMETHASONE SODIUM PHOSPHATE 10 MG/ML IJ SOLN
INTRAMUSCULAR | Status: AC
  Filled 2020-02-07: qty 1

## 2020-02-07 MED ORDER — PROPOFOL 500 MG/50ML IV EMUL
INTRAVENOUS | Status: AC
  Filled 2020-02-07: qty 50

## 2020-02-07 MED ORDER — DEXAMETHASONE SODIUM PHOSPHATE 10 MG/ML IJ SOLN
INTRAMUSCULAR | Status: DC | PRN
  Administered 2020-02-07: 5 mg via INTRAVENOUS

## 2020-02-07 MED ORDER — PROPOFOL 10 MG/ML IV BOLUS
INTRAVENOUS | Status: DC | PRN
  Administered 2020-02-07: 200 mg via INTRAVENOUS
  Administered 2020-02-07: 100 mg via INTRAVENOUS

## 2020-02-07 MED ORDER — FENTANYL CITRATE (PF) 100 MCG/2ML IJ SOLN
INTRAMUSCULAR | Status: DC | PRN
  Administered 2020-02-07: 25 ug via INTRAVENOUS
  Administered 2020-02-07: 50 ug via INTRAVENOUS
  Administered 2020-02-07 (×3): 25 ug via INTRAVENOUS

## 2020-02-07 MED ORDER — OXYCODONE HCL 5 MG PO TABS: 5.0000 mg | ORAL_TABLET | Freq: Once | ORAL | Status: DC | PRN

## 2020-02-07 MED ORDER — MIDAZOLAM HCL 5 MG/5ML IJ SOLN
INTRAMUSCULAR | Status: DC | PRN
  Administered 2020-02-07: 2 mg via INTRAVENOUS

## 2020-02-07 MED ORDER — ASPIRIN 81 MG PO CHEW: 81.0000 mg | CHEWABLE_TABLET | Freq: Two times a day (BID) | ORAL | 0 refills | Status: DC

## 2020-02-07 MED ORDER — BUPIVACAINE HCL 0.25 % IJ SOLN
INTRAMUSCULAR | Status: DC | PRN
  Administered 2020-02-07: 20 mL via INTRA_ARTICULAR

## 2020-02-07 MED ORDER — FENTANYL CITRATE (PF) 100 MCG/2ML IJ SOLN
INTRAMUSCULAR | Status: AC
  Filled 2020-02-07: qty 2

## 2020-02-07 MED ORDER — ONDANSETRON HCL 4 MG PO TABS: 4.0000 mg | ORAL_TABLET | Freq: Three times a day (TID) | ORAL | 1 refills | Status: DC | PRN

## 2020-02-07 MED ORDER — OMEPRAZOLE 20 MG PO CPDR: 20.0000 mg | DELAYED_RELEASE_CAPSULE | Freq: Every day | ORAL | 0 refills | Status: DC

## 2020-02-07 MED ORDER — MEPERIDINE HCL 25 MG/ML IJ SOLN: 6.2500 mg | INTRAMUSCULAR | Status: DC | PRN

## 2020-02-07 MED ORDER — HYDROMORPHONE HCL 1 MG/ML IJ SOLN
INTRAMUSCULAR | Status: AC
  Filled 2020-02-07: qty 0.5

## 2020-02-07 MED ORDER — HYDROMORPHONE HCL 1 MG/ML IJ SOLN
INTRAMUSCULAR | Status: AC
Start: 2020-02-07 — End: ?
  Filled 2020-02-07: qty 0.5

## 2020-02-07 MED ORDER — AMISULPRIDE (ANTIEMETIC) 5 MG/2ML IV SOLN: 10.0000 mg | Freq: Once | INTRAVENOUS | Status: DC | PRN

## 2020-02-07 MED ORDER — VANCOMYCIN HCL 1000 MG IV SOLR
INTRAVENOUS | Status: AC
  Filled 2020-02-07: qty 1000

## 2020-02-07 MED ORDER — MIDAZOLAM HCL 2 MG/2ML IJ SOLN
INTRAMUSCULAR | Status: AC
  Filled 2020-02-07: qty 2

## 2020-02-07 MED FILL — oxyCODONE HCL 5 MG TABS: 5 | 5 days supply | Qty: 30 | Fill #0

## 2020-02-07 MED FILL — ONDANSETRON HCL 4 MG TABLET: 4 | 3 days supply | Qty: 10 | Fill #0

## 2020-02-07 MED FILL — OMEPRAZOLE 20 MG CAP: 20 | 30 days supply | Qty: 30 | Fill #0

## 2020-02-07 MED FILL — MELOXICAM 7.5 MG TABLET: 7.5 | 30 days supply | Qty: 30 | Fill #0

## 2020-02-07 MED FILL — ASPIRIN CHILD 81 MG TAB CHE: 81 | 54 days supply | Qty: 108 | Fill #0

## 2020-02-07 MED FILL — ACETAMINOPHEN EXTRA STRENGT: 500 | 17 days supply | Qty: 100 | Fill #0

## 2020-02-07 SURGICAL SUPPLY — 47 items
APL PRP STRL LF DISP 70% ISPRP (MISCELLANEOUS) ×1
BANDAGE ESMARK 6X9 LF (GAUZE/BANDAGES/DRESSINGS) IMPLANT
BLADE CLIPPER SURG (BLADE) IMPLANT
BNDG CMPR 9X6 STRL LF SNTH (GAUZE/BANDAGES/DRESSINGS)
BNDG ELASTIC 6X5.8 VLCR STR LF (GAUZE/BANDAGES/DRESSINGS) ×2 IMPLANT
BNDG ESMARK 6X9 LF (GAUZE/BANDAGES/DRESSINGS)
CANNULA TWIST IN 8.25X7CM (CANNULA) ×2 IMPLANT
CHLORAPREP W/TINT 26 (MISCELLANEOUS) ×2 IMPLANT
CLSR STERI-STRIP ANTIMIC 1/2X4 (GAUZE/BANDAGES/DRESSINGS) ×2 IMPLANT
CUFF TOURN SGL QUICK 34 (TOURNIQUET CUFF) ×2
CUFF TRNQT CYL 34X4.125X (TOURNIQUET CUFF) ×1 IMPLANT
CUTTER BONE 4.0MM X 13CM (MISCELLANEOUS) ×2 IMPLANT
DISSECTOR 3.5MM X 13CM CVD (MISCELLANEOUS) ×2 IMPLANT
DISSECTOR 4.0MMX13CM CVD (MISCELLANEOUS) IMPLANT
DRAPE ARTHROSCOPY W/POUCH 90 (DRAPES) ×2 IMPLANT
DRAPE IMP U-DRAPE 54X76 (DRAPES) ×2 IMPLANT
DRAPE U-SHAPE 47X51 STRL (DRAPES) ×2 IMPLANT
GAUZE SPONGE 4X4 12PLY STRL (GAUZE/BANDAGES/DRESSINGS) ×2 IMPLANT
GAUZE XEROFORM 1X8 LF (GAUZE/BANDAGES/DRESSINGS) IMPLANT
GLOVE BIO SURGEON STRL SZ 6 (GLOVE) ×2 IMPLANT
GLOVE BIO SURGEON STRL SZ 6.5 (GLOVE) ×2 IMPLANT
GLOVE BIOGEL PI IND STRL 6.5 (GLOVE) ×1 IMPLANT
GLOVE BIOGEL PI IND STRL 7.0 (GLOVE) ×2 IMPLANT
GLOVE BIOGEL PI IND STRL 8 (GLOVE) ×1 IMPLANT
GLOVE BIOGEL PI INDICATOR 6.5 (GLOVE) ×1
GLOVE BIOGEL PI INDICATOR 7.0 (GLOVE) ×2
GLOVE BIOGEL PI INDICATOR 8 (GLOVE) ×1
GLOVE ECLIPSE 6.5 STRL STRAW (GLOVE) ×2 IMPLANT
GLOVE ECLIPSE 8.0 STRL XLNG CF (GLOVE) ×4 IMPLANT
GOWN STRL REUS W/ TWL LRG LVL3 (GOWN DISPOSABLE) ×2 IMPLANT
GOWN STRL REUS W/TWL LRG LVL3 (GOWN DISPOSABLE) ×4
GOWN STRL REUS W/TWL XL LVL3 (GOWN DISPOSABLE) ×2 IMPLANT
IV NS IRRIG 3000ML ARTHROMATIC (IV SOLUTION) ×4 IMPLANT
KIT TURNOVER KIT B (KITS) ×2 IMPLANT
KNEE WRAP E Z 3 GEL PACK (MISCELLANEOUS) ×2 IMPLANT
MANIFOLD NEPTUNE II (INSTRUMENTS) IMPLANT
NDL SAFETY ECLIPSE 18X1.5 (NEEDLE) ×1 IMPLANT
NEEDLE HYPO 18GX1.5 SHARP (NEEDLE) ×2
NS IRRIG 1000ML POUR BTL (IV SOLUTION) IMPLANT
PACK ARTHROSCOPY DSU (CUSTOM PROCEDURE TRAY) ×2 IMPLANT
PAD ARMBOARD 7.5X6 YLW CONV (MISCELLANEOUS) ×4 IMPLANT
PADDING CAST COTTON 6X4 STRL (CAST SUPPLIES) IMPLANT
SUT MNCRL AB 4-0 PS2 18 (SUTURE) ×2 IMPLANT
SYR 5ML LUER SLIP (SYRINGE) ×2 IMPLANT
TOWEL GREEN STERILE FF (TOWEL DISPOSABLE) ×2 IMPLANT
TUBING ARTHROSCOPY IRRIG 16FT (MISCELLANEOUS) ×2 IMPLANT
WATER STERILE IRR 1000ML POUR (IV SOLUTION) IMPLANT

## 2020-02-07 NOTE — Anesthesia Procedure Notes (Addendum)
Procedure Name: LMA Insertion Date/Time: 02/07/2020 7:47 AM Performed by: Lavonia Dana, CRNA Pre-anesthesia Checklist: Patient identified, Emergency Drugs available, Suction available and Patient being monitored Patient Re-evaluated:Patient Re-evaluated prior to induction Oxygen Delivery Method: Circle system utilized Preoxygenation: Pre-oxygenation with 100% oxygen Induction Type: IV induction Ventilation: Mask ventilation without difficulty LMA: LMA inserted LMA Size: 5.0 Number of attempts: 1 Airway Equipment and Method: Bite block Placement Confirmation: positive ETCO2 Tube secured with: Tape Dental Injury: Teeth and Oropharynx as per pre-operative assessment

## 2020-02-07 NOTE — Interval H&P Note (Signed)
History and Physical Interval Note:  02/07/2020 7:07 AM  Joshua Gaines  has presented today for surgery, with the diagnosis of [place].  The various methods of treatment have been discussed with the patient and family. After consideration of risks, benefits and other options for treatment, the patient has consented to  Procedure(s): ARTHROSCOPY KNEE REMOVAL OF LOOSE FOREIGN BODY, AND DEBRIDEMENT/SHAVING CHONDROPLASTY (Right) as a surgical intervention.  The patient's history has been reviewed, patient examined, no change in status, stable for surgery.  I have reviewed the patient's chart and labs.  Questions were answered to the patient's satisfaction.     Hiram Gash

## 2020-02-07 NOTE — Anesthesia Preprocedure Evaluation (Signed)
Anesthesia Evaluation  Patient identified by MRN, date of birth, ID band Patient awake    Reviewed: Allergy & Precautions, NPO status , Patient's Chart, lab work & pertinent test results  History of Anesthesia Complications (+) PONV and history of anesthetic complications  Airway Mallampati: I  TM Distance: >3 FB Neck ROM: Full    Dental no notable dental hx. (+) Teeth Intact, Dental Advisory Given   Pulmonary neg pulmonary ROS,    Pulmonary exam normal breath sounds clear to auscultation       Cardiovascular negative cardio ROS Normal cardiovascular exam Rhythm:Regular Rate:Normal     Neuro/Psych negative neurological ROS  negative psych ROS   GI/Hepatic Neg liver ROS, GERD  ,  Endo/Other  negative endocrine ROS  Renal/GU negative Renal ROS  negative genitourinary   Musculoskeletal  (+) Arthritis , Osteoarthritis,    Abdominal   Peds  Hematology negative hematology ROS (+)   Anesthesia Other Findings   Reproductive/Obstetrics                             Anesthesia Physical  Anesthesia Plan  ASA: II  Anesthesia Plan: General   Post-op Pain Management:    Induction: Intravenous  PONV Risk Score and Plan: 3 and Ondansetron, Dexamethasone, Midazolam and Treatment may vary due to age or medical condition  Airway Management Planned: LMA  Additional Equipment:   Intra-op Plan:   Post-operative Plan: Extubation in OR  Informed Consent: I have reviewed the patients History and Physical, chart, labs and discussed the procedure including the risks, benefits and alternatives for the proposed anesthesia with the patient or authorized representative who has indicated his/her understanding and acceptance.     Dental advisory given  Plan Discussed with: CRNA  Anesthesia Plan Comments:         Anesthesia Quick Evaluation  

## 2020-02-07 NOTE — Anesthesia Postprocedure Evaluation (Signed)
Anesthesia Post Note  Patient: Joshua Gaines  Procedure(s) Performed: ARTHROSCOPY KNEE REMOVAL OF LOOSE FOREIGN BODY, PARTIAL LATERAL MENISECTOMY AND DEBRIDEMENT/SHAVING CHONDROPLASTY (Right Knee) CHONDROPLASTY (Right Knee)     Patient location during evaluation: PACU Anesthesia Type: General Level of consciousness: awake and alert Pain management: pain level controlled Vital Signs Assessment: post-procedure vital signs reviewed and stable Respiratory status: spontaneous breathing, nonlabored ventilation and respiratory function stable Cardiovascular status: blood pressure returned to baseline and stable Postop Assessment: no apparent nausea or vomiting Anesthetic complications: no   No complications documented.  Last Vitals:  Vitals:   02/07/20 1005 02/07/20 1006  BP:    Pulse: (!) 54 (!) 57  Resp: 10 16  Temp:    SpO2: 100% 100%    Last Pain:  Vitals:   02/07/20 1025  TempSrc:   PainSc: 1         RLE Motor Response: Purposeful movement (02/07/20 1025) RLE Sensation: Decreased (02/07/20 1025)      Lynda Rainwater

## 2020-02-07 NOTE — Interval H&P Note (Signed)
History and Physical Interval Note:  02/07/2020 7:07 AM  Joshua Gaines  has presented today for surgery, with the diagnosis of [place].  The various methods of treatment have been discussed with the patient and family. After consideration of risks, benefits and other options for treatment, the patient has consented to  Procedure(s): ARTHROSCOPY KNEE REMOVAL OF LOOSE FOREIGN BODY, AND DEBRIDEMENT/SHAVING CHONDROPLASTY (Right) as a surgical intervention.  The patient's history has been reviewed, patient examined, no change in status, stable for surgery.  I have reviewed the patient's chart and labs.  Questions were answered to the patient's satisfaction.     Hiram Gash

## 2020-02-07 NOTE — Transfer of Care (Signed)
Immediate Anesthesia Transfer of Care Note  Patient: Joshua Gaines  Procedure(s) Performed: ARTHROSCOPY KNEE REMOVAL OF LOOSE FOREIGN BODY, PARTIAL LATERAL MENISECTOMY AND DEBRIDEMENT/SHAVING CHONDROPLASTY (Right Knee) CHONDROPLASTY (Right Knee)  Patient Location: PACU  Anesthesia Type:General  Level of Consciousness: sedated  Airway & Oxygen Therapy: Patient Spontanous Breathing and Patient connected to face mask oxygen  Post-op Assessment: Report given to RN and Post -op Vital signs reviewed and stable  Post vital signs: Reviewed and stable  Last Vitals:  Vitals Value Taken Time  BP 118/75 02/07/20 0900  Temp    Pulse 51 02/07/20 0900  Resp 14 02/07/20 0900  SpO2 100 % 02/07/20 0900  Vitals shown include unvalidated device data.  Last Pain:  Vitals:   02/07/20 0713  TempSrc: Oral  PainSc: 0-No pain         Complications: No complications documented.

## 2020-02-07 NOTE — Discharge Instructions (Signed)

## 2020-02-07 NOTE — Op Note (Signed)
Orthopaedic Surgery Operative Note (CSN: [id number])  Joshua Gaines  1980-05-30 Date of Surgery: 02/07/2020   Diagnoses:  Right knee loose body with mechanical symptoms  Procedure: Right lateral meniscectomy Right loose body excision   Operative Finding Exam under anesthesia: Lacked about 1 to 2 degrees short of full extension, full flexion noted, good endpoint on Lachman Suprapatellar pouch: No loose bodies checked carefully Patellofemoral Compartment: Significant patellofemoral wear with grade 4 central trochlear wear and some concomitant patellar wear Medial Compartment: Mild grade 1 changes scattered with some areas of grade 2, meniscus intact Lateral Compartment: Grade 2 and 3 changes scattered on the tibiofemoral joint, posterior lateral meniscus tear noted debrided back to stable base, 40% total meniscal volume resected. Intercondylar Notch: ACL graft intact.  Overgrowth of the notch was noted.  Successful completion of the planned procedure.  Patient had a meniscus tear which is likely contributing significantly to his symptoms.  His loose bodies also in the posterior lateral aspect of the knee embedded partially in the capsule we remove these piecemeal to avoid damage to the capsule.  We did make a posterior medial incision and portal under blunt dissection and spinal needle guidance.  There was still some residual osteochondral fragments that were embedded deep in the tissue and we did not feel was in the patient's best interest to go after these.  We warned the family that he may end up developing morning loose bodies as his arthritis is not insignificant.  Post-operative plan: The patient will be weightbearing to tolerance.  The patient will be discharged home.  DVT prophylaxis Aspirin 81 mg twice daily for 6 weeks.  Pain control with PRN pain medication preferring oral medicines.  Follow up plan will be scheduled in approximately 7 days for incision check and XR.  Post-Op  Diagnosis: Same Surgeons:Primary: Hiram Gash, MD Assistants:Caroline McBane PA-C Location: [facility] OR ROOM 6 Anesthesia: General with local Antibiotics: Ancef 2 g with local vancomycin powder 1 g at the surgical site Tourniquet time:  Total Tourniquet Time Documented: Thigh (Right) - 52 minutes Total: Thigh (Right) - 52 minutes  Estimated Blood Loss: Minimal Complications: None Specimens: None Implants: * No implants in log *  Indications for Surgery:   Joshua Gaines is a 39 y.o. male with previous revision ACL surgery performed by myself in mechanical type symptoms with lateral based knee pain.  MRI showed possible meniscal pathology but more clearly partially embedded loose bodies in the lateral posterior aspect of the knee.  Benefits and risks of operative and nonoperative management were discussed prior to surgery with patient/guardian(s) and informed consent form was completed.  Specific risks including infection, need for additional surgery, continued pain, new loose bodies, postoperative arthrosis   Procedure:   The patient was identified properly. Informed consent was obtained and the surgical site was marked. The patient was taken up to suite where general anesthesia was induced. The patient was placed in the supine position with a post against the surgical leg and a nonsterile tourniquet applied. The surgical leg was then prepped and draped usual sterile fashion.  A standard surgical timeout was performed.  2 standard anterior portals were made and diagnostic arthroscopy performed. Please note the findings as noted above.  We spinal needle guidance and a posterior medial incision was made and we did blunt dissection to put a cannula in for visualization of the posterior aspect of the joint.  We cleared the medial and lateral portion of the joint and performed a  partial lateral meniscectomy with a basket and shaver back to stable base.  We identified 2-3 osteochondral loose bodies  that were partially embedded in the posterior aspect of the tissue.  We thought that these may be engaging in deep flexion.  We debulked and remove these loose bodies leaving one of them partially shelled out as we did not want to disrupt the capsule too significantly.  We were not clear on whether his meniscus was causing most of his symptoms or is a lot loose bodies but there were no other loose fragments.  He has patellofemoral cartilage changes that we debrided back with a shaver.  Incisions closed with absorbable suture. The patient was awoken from general anesthesia and taken to the PACU in stable condition without complication.   Noemi Chapel, PA-C, present and scrubbed throughout the case, critical for completion in a timely fashion, and for retraction, instrumentation, closure.

## 2020-02-12 ENCOUNTER — Encounter (HOSPITAL_BASED_OUTPATIENT_CLINIC_OR_DEPARTMENT_OTHER): Payer: Self-pay | Admitting: Orthopaedic Surgery

## 2020-02-14 DIAGNOSIS — M2341 Loose body in knee, right knee: Secondary | ICD-10-CM | POA: Diagnosis not present

## 2020-02-28 ENCOUNTER — Other Ambulatory Visit (HOSPITAL_COMMUNITY): Payer: Self-pay | Admitting: Orthopaedic Surgery

## 2020-02-28 ENCOUNTER — Ambulatory Visit (HOSPITAL_COMMUNITY)
Admission: RE | Admit: 2020-02-28 | Discharge: 2020-02-28 | Disposition: A | Discharge status: Home / Self Care | Payer: 59 | Source: Ambulatory Visit | Attending: Orthopaedic Surgery | Admitting: Orthopaedic Surgery

## 2020-02-28 ENCOUNTER — Other Ambulatory Visit: Payer: Self-pay

## 2020-02-28 DIAGNOSIS — M79604 Pain in right leg: Secondary | ICD-10-CM

## 2020-02-28 DIAGNOSIS — M7989 Other specified soft tissue disorders: Secondary | ICD-10-CM

## 2020-02-28 NOTE — Progress Notes (Signed)
Lower extremity venous has been completed.   Preliminary results in CV Proc.   Abram Sander 02/28/2020 3:44 PM

## 2020-02-29 ENCOUNTER — Other Ambulatory Visit: Payer: Self-pay | Admitting: Orthopaedic Surgery

## 2020-02-29 DIAGNOSIS — M25561 Pain in right knee: Secondary | ICD-10-CM

## 2020-03-01 ENCOUNTER — Other Ambulatory Visit: Payer: Self-pay

## 2020-03-01 ENCOUNTER — Ambulatory Visit (INDEPENDENT_AMBULATORY_CARE_PROVIDER_SITE_OTHER): Payer: 59

## 2020-03-01 DIAGNOSIS — M25461 Effusion, right knee: Secondary | ICD-10-CM | POA: Diagnosis not present

## 2020-03-01 DIAGNOSIS — M25561 Pain in right knee: Secondary | ICD-10-CM | POA: Diagnosis not present

## 2020-03-01 DIAGNOSIS — M2341 Loose body in knee, right knee: Secondary | ICD-10-CM | POA: Diagnosis not present

## 2020-03-01 DIAGNOSIS — M7121 Synovial cyst of popliteal space [Baker], right knee: Secondary | ICD-10-CM | POA: Diagnosis not present

## 2020-03-17 NOTE — H&P (Signed)
PREOPERATIVE H&P  Chief Complaint: RIGHT KNEE REMOVAL LOOSE FOREIGN BODY  HPI: Joshua Gaines is a 39 y.o. male who is scheduled for, Procedure(s): ARTHROSCOPY KNEE LOOSE BODY EXCISION, CHONDROPLASTY.   Patient has a past medical history significant for PONV and GERD.   Patient is still having some trouble with the posterior lateral aspect of his knee.  He has some swelling and pain in his calf that has been present for the last couple of days.  He has trouble going to full extension.  He worries that he has another loose body. He previously had right knee scope with meniscectomy and loose body excision on 02/07/2020. He had a revision ACL reconstruction in 2019.   His symptoms are rated as moderate to severe, and have been worsening.  This is significantly impairing activities of daily living.    Please see clinic note for further details on this patient's care.    He has elected for surgical management.   Past Medical History:  Diagnosis Date  . GERD (gastroesophageal reflux disease)   . PONV (postoperative nausea and vomiting)    Past Surgical History:  Procedure Laterality Date  . ANTERIOR CRUCIATE LIGAMENT REPAIR  [id number]  . CHONDROPLASTY Right 02/01/2018   Procedure: CHONDROPLASTY;  Surgeon: Hiram Gash, MD;  Location: [facility];  Service: Orthopedics;  Laterality: Right;  . CHONDROPLASTY Right 02/07/2020   Procedure: CHONDROPLASTY;  Surgeon: Hiram Gash, MD;  Location: [facility];  Service: Orthopedics;  Laterality: Right;  . HARDWARE REMOVAL Right 02/01/2018   Procedure: HARDWARE REMOVAL;  Surgeon: Hiram Gash, MD;  Location: [facility];  Service: Orthopedics;  Laterality: Right;  . HARDWARE REMOVAL Left 06/11/2019   Procedure: HARDWARE REMOVAL;  Surgeon: Hiram Gash, MD;  Location: [facility];  Service: Orthopedics;  Laterality: Left;  . HERNIA REPAIR  aprox 2010 or 2013   right inguinal   . KNEE  ARTHROSCOPY WITH ANTERIOR CRUCIATE LIGAMENT (ACL) REPAIR Left 06/11/2019   Procedure: KNEE ARTHROSCOPY WITH ANTERIOR CRUCIATE LIGAMENT (ACL) REPAIR WITH HAMSTRING AUTOGRAFT;  Surgeon: Hiram Gash, MD;  Location: [facility];  Service: Orthopedics;  Laterality: Left;  . KNEE ARTHROSCOPY WITH ANTERIOR CRUCIATE LIGAMENT (ACL) REPAIR WITH HAMSTRING GRAFT Right 02/01/2018   Procedure: RIGHT KNEE ARTHROSCOPY WITH POSSIBLE REVISION ANTERIOR CRUCIATE LIGAMENT (ACL) REPAIR WITH AUTOGRAFT HAMSTRING;  Surgeon: Hiram Gash, MD;  Location: [facility];  Service: Orthopedics;  Laterality: Right;  . KNEE ARTHROSCOPY WITH LATERAL MENISECTOMY Right 02/01/2018   Procedure: RIGHT KNEE ARTHROSCOPY WITH LATERAL MENISECTOMY;  Surgeon: Hiram Gash, MD;  Location: [facility];  Service: Orthopedics;  Laterality: Right;  . KNEE ARTHROSCOPY WITH LATERAL MENISECTOMY Right 02/07/2020   Procedure: ARTHROSCOPY KNEE REMOVAL OF LOOSE FOREIGN BODY, PARTIAL LATERAL MENISECTOMY AND DEBRIDEMENT/SHAVING CHONDROPLASTY;  Surgeon: Hiram Gash, MD;  Location: [facility];  Service: Orthopedics;  Laterality: Right;  . KNEE ARTHROSCOPY WITH MEDIAL MENISECTOMY Right 02/01/2018   Procedure: RIGHT KNEE ARTHROSCOPY WITH MEDIAL MENISECTOMY, CHONDROPLASTY;  Surgeon: Hiram Gash, MD;  Location: [facility];  Service: Orthopedics;  Laterality: Right;  . MENISCUS REPAIR     left knee   Social History   Socioeconomic History  . Marital status: Married    Spouse name: Not on file  . Number of children: Not on file  . Years of education: Not on file  . Highest education level: Not on file  Occupational History  . Not on file  Tobacco Use  . Smoking status: Never Smoker  . Smokeless tobacco: Never Used  Vaping Use  . Vaping Use: Never used  Substance and Sexual Activity  . Alcohol use: No  . Drug use: No  . Sexual activity: Yes    Partners: Female  Other Topics  Concern  . Not on file  Social History Narrative  . Not on file   Social Determinants of Health   Financial Resource Strain:   . Difficulty of Paying Living Expenses: Not on file  Food Insecurity:   . Worried About Charity fundraiser in the Last Year: Not on file  . Ran Out of Food in the Last Year: Not on file  Transportation Needs:   . Lack of Transportation (Medical): Not on file  . Lack of Transportation (Non-Medical): Not on file  Physical Activity:   . Days of Exercise per Week: Not on file  . Minutes of Exercise per Session: Not on file  Stress:   . Feeling of Stress : Not on file  Social Connections:   . Frequency of Communication with Friends and Family: Not on file  . Frequency of Social Gatherings with Friends and Family: Not on file  . Attends Religious Services: Not on file  . Active Member of Clubs or Organizations: Not on file  . Attends Archivist Meetings: Not on file  . Marital Status: Not on file   No family history on file. No Known Allergies Prior to Admission medications   Medication Sig Start Date End Date Taking? Authorizing Provider  aspirin (ASPIRIN CHILDRENS) 81 MG chewable tablet Chew 1 tablet (81 mg total) by mouth 2 (two) times daily. For 6 weeks for DVT prophylaxis after surgery 02/07/20 03/20/20  Ethelda Chick, PA-C  omeprazole (PRILOSEC) 20 MG capsule Take 1 capsule (20 mg total) by mouth daily. To protect your stomach while taking NSAIDs 02/07/20 03/08/20  Percilla Tweten, Maylene Roes, PA-C    ROS: All other systems have been reviewed and were otherwise negative with the exception of those mentioned in the HPI and as above.  Physical Exam: General: Alert, no acute distress Cardiovascular: No pedal edema Respiratory: No cyanosis, no use of accessory musculature GI: No organomegaly, abdomen is soft and non-tender Skin: No lesions in the area of chief complaint Neurologic: Sensation intact distally Psychiatric: Patient is competent for  consent with normal mood and affect Lymphatic: No axillary or cervical lymphadenopathy  MUSCULOSKELETAL:  Right knee range of motion is limited in full extension secondary to pain.  He has no obvious effusion today.  He has some tenderness in his calf.    Imaging: MRI showing postsurgical changes, ACL graft intact, loose bodies along posterior aspect of lateral femoral condyle   Assessment: RIGHT KNEE REMOVAL LOOSE FOREIGN BODY  Plan: Plan for Procedure(s): ARTHROSCOPY KNEE LOOSE BODY EXCISION, CHONDROPLASTY  The risks benefits and alternatives were discussed with the patient including but not limited to the risks of nonoperative treatment, versus surgical intervention including infection, bleeding, nerve injury,  blood clots, cardiopulmonary complications, morbidity, mortality, among others, and they were willing to proceed.   The patient acknowledged the explanation, agreed to proceed with the plan and consent was signed.   Operative Plan: Right knee scope with chondroplasty and loose body excision Discharge Medications:Tylenol, Meloxicam, Omeprazole, Zofran, Oxycodone DVT Prophylaxis:Aspirin Physical Therapy:Outpatient PT Special Discharge needs:Knee immobilizer if receives a block   Ethelda Chick, PA-C  03/17/2020 2:53 PM

## 2020-03-18 ENCOUNTER — Other Ambulatory Visit: Payer: Self-pay

## 2020-03-18 ENCOUNTER — Encounter (HOSPITAL_BASED_OUTPATIENT_CLINIC_OR_DEPARTMENT_OTHER): Payer: Self-pay | Admitting: Orthopaedic Surgery

## 2020-03-19 NOTE — Progress Notes (Signed)

## 2020-03-19 NOTE — Progress Notes (Signed)
RN unable to see pt's covid test results from 03/17/2020 testing.  RN called microbiology and the pre-procedural testing site, and they were also unable to see covid test results.  RN called health at work, and they had the result, but RN was instructed to let the pt know he, himself, would have to call to get his own results. RN called the pt and instructed him to call health at work, and phone number given, to get his test results.  RN also instructed pt to email Amedeo Plenty, RN the results of his covid test for surgery tomorrow (03/20/20).

## 2020-03-20 ENCOUNTER — Ambulatory Visit (HOSPITAL_BASED_OUTPATIENT_CLINIC_OR_DEPARTMENT_OTHER): Payer: 59 | Admitting: Anesthesiology

## 2020-03-20 ENCOUNTER — Encounter (HOSPITAL_BASED_OUTPATIENT_CLINIC_OR_DEPARTMENT_OTHER)
Admission: RE | Disposition: A | Discharge status: Home / Self Care | Payer: Self-pay | Source: Home / Self Care | Attending: Orthopaedic Surgery

## 2020-03-20 ENCOUNTER — Other Ambulatory Visit (HOSPITAL_BASED_OUTPATIENT_CLINIC_OR_DEPARTMENT_OTHER): Payer: Self-pay | Admitting: Physician Assistant

## 2020-03-20 ENCOUNTER — Encounter (HOSPITAL_BASED_OUTPATIENT_CLINIC_OR_DEPARTMENT_OTHER): Payer: Self-pay | Admitting: Orthopaedic Surgery

## 2020-03-20 ENCOUNTER — Other Ambulatory Visit: Payer: Self-pay

## 2020-03-20 ENCOUNTER — Ambulatory Visit (HOSPITAL_BASED_OUTPATIENT_CLINIC_OR_DEPARTMENT_OTHER)
Admission: RE | Admit: 2020-03-20 | Discharge: 2020-03-20 | Disposition: A | Discharge status: Home / Self Care | Payer: 59 | Attending: Orthopaedic Surgery | Admitting: Orthopaedic Surgery

## 2020-03-20 DIAGNOSIS — Z79899 Other long term (current) drug therapy: Secondary | ICD-10-CM | POA: Diagnosis not present

## 2020-03-20 DIAGNOSIS — Z7982 Long term (current) use of aspirin: Secondary | ICD-10-CM | POA: Diagnosis not present

## 2020-03-20 DIAGNOSIS — K219 Gastro-esophageal reflux disease without esophagitis: Secondary | ICD-10-CM | POA: Diagnosis not present

## 2020-03-20 DIAGNOSIS — M2341 Loose body in knee, right knee: Secondary | ICD-10-CM | POA: Diagnosis not present

## 2020-03-20 DIAGNOSIS — M2342 Loose body in knee, left knee: Secondary | ICD-10-CM | POA: Diagnosis not present

## 2020-03-20 HISTORY — PX: KNEE ARTHROSCOPY: SHX127

## 2020-03-20 SURGERY — ARTHROSCOPY, KNEE
Anesthesia: General | Site: Knee | Laterality: Right

## 2020-03-20 MED ORDER — FENTANYL CITRATE (PF) 100 MCG/2ML IJ SOLN
INTRAMUSCULAR | Status: DC | PRN
  Administered 2020-03-20 (×2): 50 ug via INTRAVENOUS

## 2020-03-20 MED ORDER — KETOROLAC TROMETHAMINE 30 MG/ML IJ SOLN
INTRAMUSCULAR | Status: DC | PRN
  Administered 2020-03-20: 30 mg via INTRAVENOUS

## 2020-03-20 MED ORDER — ONDANSETRON HCL 4 MG/2ML IJ SOLN
INTRAMUSCULAR | Status: DC | PRN
  Administered 2020-03-20: 4 mg via INTRAVENOUS

## 2020-03-20 MED ORDER — OXYCODONE HCL 5 MG/5ML PO SOLN: 5.0000 mg | Freq: Once | ORAL | Status: AC | PRN

## 2020-03-20 MED ORDER — PROMETHAZINE HCL 25 MG/ML IJ SOLN: 6.2500 mg | INTRAMUSCULAR | Status: DC | PRN

## 2020-03-20 MED ORDER — ASPIRIN 81 MG PO CHEW: 81.0000 mg | CHEWABLE_TABLET | Freq: Two times a day (BID) | ORAL | 0 refills | Status: DC

## 2020-03-20 MED ORDER — ONDANSETRON HCL 4 MG PO TABS: 4.0000 mg | ORAL_TABLET | Freq: Three times a day (TID) | ORAL | 1 refills | Status: DC | PRN

## 2020-03-20 MED ORDER — LIDOCAINE 2% (20 MG/ML) 5 ML SYRINGE
INTRAMUSCULAR | Status: DC | PRN
  Administered 2020-03-20: 100 mg via INTRAVENOUS

## 2020-03-20 MED ORDER — OMEPRAZOLE 20 MG PO CPDR: 20.0000 mg | DELAYED_RELEASE_CAPSULE | Freq: Every day | ORAL | 0 refills | Status: DC

## 2020-03-20 MED ORDER — OXYCODONE HCL 5 MG PO TABS
ORAL_TABLET | ORAL | Status: AC
  Filled 2020-03-20: qty 1

## 2020-03-20 MED ORDER — HYDROMORPHONE HCL 1 MG/ML IJ SOLN
INTRAMUSCULAR | Status: AC
  Filled 2020-03-20: qty 0.5

## 2020-03-20 MED ORDER — PROPOFOL 10 MG/ML IV BOLUS
INTRAVENOUS | Status: DC | PRN
  Administered 2020-03-20: 200 mg via INTRAVENOUS

## 2020-03-20 MED ORDER — MELOXICAM 7.5 MG PO TABS: 7.5000 mg | ORAL_TABLET | Freq: Every day | ORAL | 0 refills | Status: DC

## 2020-03-20 MED ORDER — HYDROMORPHONE HCL 1 MG/ML IJ SOLN
0.2500 mg | INTRAMUSCULAR | Status: DC | PRN
  Administered 2020-03-20 (×2): 0.5 mg via INTRAVENOUS

## 2020-03-20 MED ORDER — MIDAZOLAM HCL 2 MG/2ML IJ SOLN
INTRAMUSCULAR | Status: AC
  Filled 2020-03-20: qty 2

## 2020-03-20 MED ORDER — OXYCODONE HCL 5 MG PO TABS
5.0000 mg | ORAL_TABLET | Freq: Once | ORAL | Status: AC | PRN
  Administered 2020-03-20: 5 mg via ORAL

## 2020-03-20 MED ORDER — BUPIVACAINE HCL (PF) 0.25 % IJ SOLN
INTRAMUSCULAR | Status: AC
  Filled 2020-03-20: qty 30

## 2020-03-20 MED ORDER — BUPIVACAINE HCL 0.25 % IJ SOLN
INTRAMUSCULAR | Status: DC | PRN
  Administered 2020-03-20: 20 mL

## 2020-03-20 MED ORDER — LACTATED RINGERS IV SOLN: INTRAVENOUS | Status: DC

## 2020-03-20 MED ORDER — CEFAZOLIN SODIUM-DEXTROSE 2-4 GM/100ML-% IV SOLN
INTRAVENOUS | Status: AC
  Filled 2020-03-20: qty 100

## 2020-03-20 MED ORDER — AMISULPRIDE (ANTIEMETIC) 5 MG/2ML IV SOLN: 10.0000 mg | Freq: Once | INTRAVENOUS | Status: DC | PRN

## 2020-03-20 MED ORDER — CEFAZOLIN SODIUM-DEXTROSE 2-4 GM/100ML-% IV SOLN
2.0000 g | INTRAVENOUS | Status: AC
  Administered 2020-03-20: 2 g via INTRAVENOUS

## 2020-03-20 MED ORDER — MIDAZOLAM HCL 2 MG/2ML IJ SOLN
INTRAMUSCULAR | Status: DC | PRN
  Administered 2020-03-20: 2 mg via INTRAVENOUS

## 2020-03-20 MED ORDER — MEPERIDINE HCL 25 MG/ML IJ SOLN: 6.2500 mg | INTRAMUSCULAR | Status: DC | PRN

## 2020-03-20 MED ORDER — ACETAMINOPHEN 500 MG PO TABS: 1000.0000 mg | ORAL_TABLET | Freq: Three times a day (TID) | ORAL | 0 refills | Status: DC

## 2020-03-20 MED ORDER — FENTANYL CITRATE (PF) 100 MCG/2ML IJ SOLN
INTRAMUSCULAR | Status: AC
  Filled 2020-03-20: qty 2

## 2020-03-20 MED ORDER — DEXMEDETOMIDINE (PRECEDEX) IN NS 20 MCG/5ML (4 MCG/ML) IV SYRINGE
PREFILLED_SYRINGE | INTRAVENOUS | Status: DC | PRN
  Administered 2020-03-20: 12 ug via INTRAVENOUS
  Administered 2020-03-20: 8 ug via INTRAVENOUS

## 2020-03-20 MED ORDER — DEXAMETHASONE SODIUM PHOSPHATE 10 MG/ML IJ SOLN
INTRAMUSCULAR | Status: DC | PRN
  Administered 2020-03-20: 10 mg via INTRAVENOUS

## 2020-03-20 MED FILL — OMEPRAZOLE 20 MG CAP: 20 | 30 days supply | Qty: 30 | Fill #0

## 2020-03-20 MED FILL — ACETAMINOPHEN EXTRA STRENGT: 500 | 16 days supply | Qty: 100 | Fill #0

## 2020-03-20 MED FILL — ASPIRIN CHILD 81 MG TAB CHE: 81 | 36 days supply | Qty: 72 | Fill #0

## 2020-03-20 MED FILL — MELOXICAM 7.5 MG TABLET: 7.5 | 30 days supply | Qty: 30 | Fill #0

## 2020-03-20 SURGICAL SUPPLY — 42 items
APL PRP STRL LF DISP 70% ISPRP (MISCELLANEOUS) ×1
BANDAGE ESMARK 6X9 LF (GAUZE/BANDAGES/DRESSINGS) IMPLANT
BLADE CLIPPER SURG (BLADE) IMPLANT
BLADE EXCALIBUR 4.0X13 (MISCELLANEOUS) ×2 IMPLANT
BNDG CMPR 9X6 STRL LF SNTH (GAUZE/BANDAGES/DRESSINGS)
BNDG ELASTIC 6X5.8 VLCR STR LF (GAUZE/BANDAGES/DRESSINGS) ×2 IMPLANT
BNDG ESMARK 6X9 LF (GAUZE/BANDAGES/DRESSINGS)
CANNULA TWIST IN 8.25X7CM (CANNULA) ×2 IMPLANT
CHLORAPREP W/TINT 26 (MISCELLANEOUS) ×2 IMPLANT
CLSR STERI-STRIP ANTIMIC 1/2X4 (GAUZE/BANDAGES/DRESSINGS) ×2 IMPLANT
CUFF TOURN SGL QUICK 34 (TOURNIQUET CUFF)
CUFF TRNQT CYL 34X4.125X (TOURNIQUET CUFF) IMPLANT
DISSECTOR 3.5MM X 13CM CVD (MISCELLANEOUS) ×2 IMPLANT
DISSECTOR 4.0MMX13CM CVD (MISCELLANEOUS) IMPLANT
DRAPE ARTHROSCOPY W/POUCH 90 (DRAPES) ×2 IMPLANT
DRAPE IMP U-DRAPE 54X76 (DRAPES) ×2 IMPLANT
DRAPE U-SHAPE 47X51 STRL (DRAPES) ×2 IMPLANT
GAUZE SPONGE 4X4 12PLY STRL (GAUZE/BANDAGES/DRESSINGS) ×2 IMPLANT
GAUZE XEROFORM 1X8 LF (GAUZE/BANDAGES/DRESSINGS) ×2 IMPLANT
GLOVE BIO SURGEON STRL SZ 6.5 (GLOVE) ×2 IMPLANT
GLOVE BIOGEL PI IND STRL 6.5 (GLOVE) ×1 IMPLANT
GLOVE BIOGEL PI IND STRL 8 (GLOVE) ×1 IMPLANT
GLOVE BIOGEL PI INDICATOR 6.5 (GLOVE) ×1
GLOVE BIOGEL PI INDICATOR 8 (GLOVE) ×1
GLOVE ECLIPSE 8.0 STRL XLNG CF (GLOVE) ×4 IMPLANT
GOWN STRL REUS W/ TWL LRG LVL3 (GOWN DISPOSABLE) ×2 IMPLANT
GOWN STRL REUS W/TWL LRG LVL3 (GOWN DISPOSABLE) ×4
GOWN STRL REUS W/TWL XL LVL3 (GOWN DISPOSABLE) ×2 IMPLANT
HANDPIECE NANOSCOPE (MISCELLANEOUS) ×2 IMPLANT
KIT TURNOVER KIT B (KITS) ×2 IMPLANT
MANIFOLD NEPTUNE II (INSTRUMENTS) IMPLANT
NDL SAFETY ECLIPSE 18X1.5 (NEEDLE) ×1 IMPLANT
NEEDLE HYPO 18GX1.5 SHARP (NEEDLE) ×2
NS IRRIG 1000ML POUR BTL (IV SOLUTION) IMPLANT
PACK ARTHROSCOPY DSU (CUSTOM PROCEDURE TRAY) ×2 IMPLANT
PAD ARMBOARD 7.5X6 YLW CONV (MISCELLANEOUS) ×4 IMPLANT
PADDING CAST COTTON 6X4 STRL (CAST SUPPLIES) IMPLANT
SUT MNCRL AB 4-0 PS2 18 (SUTURE) ×2 IMPLANT
SYR 5ML LUER SLIP (SYRINGE) ×2 IMPLANT
TOWEL GREEN STERILE FF (TOWEL DISPOSABLE) ×2 IMPLANT
TUBING ARTHROSCOPY IRRIG 16FT (MISCELLANEOUS) ×2 IMPLANT
WATER STERILE IRR 1000ML POUR (IV SOLUTION) ×2 IMPLANT

## 2020-03-20 NOTE — Anesthesia Procedure Notes (Signed)
Procedure Name: LMA Insertion Date/Time: 03/20/2020 12:23 PM Performed by: [place] Ladoris Lythgoe C, CRNA Pre-anesthesia Checklist: Patient identified, Emergency Drugs available, Suction available and Patient being monitored Patient Re-evaluated:Patient Re-evaluated prior to induction Oxygen Delivery Method: Circle system utilized Preoxygenation: Pre-oxygenation with 100% oxygen Induction Type: IV induction Ventilation: Mask ventilation without difficulty LMA: LMA inserted LMA Size: 5.0 Number of attempts: 1 Airway Equipment and Method: Bite block Placement Confirmation: positive ETCO2 Tube secured with: Tape Dental Injury: Teeth and Oropharynx as per pre-operative assessment

## 2020-03-20 NOTE — Discharge Instructions (Signed)
Post Anesthesia Home Care Instructions  Activity: Get plenty of rest for the remainder of the day. A responsible individual must stay with you for 24 hours following the procedure.  For the next 24 hours, DO NOT: -Drive a car -Paediatric nurse -Drink alcoholic beverages -Take any medication unless instructed by your physician -Make any legal decisions or sign important papers.  Meals: Start with liquid foods such as gelatin or soup. Progress to regular foods as tolerated. Avoid greasy, spicy, heavy foods. If nausea and/or vomiting occur, drink only clear liquids until the nausea and/or vomiting subsides. Call your physician if vomiting continues.  Special Instructions/Symptoms: Your throat may feel dry or sore from the anesthesia or the breathing tube placed in your throat during surgery. If this causes discomfort, gargle with warm salt water. The discomfort should disappear within 24 hours.  No NSAIDS (Ibuprofen/Motrin/Aleve) until at least 7:45pm. Oxycodone given at 2:30pm

## 2020-03-20 NOTE — Anesthesia Postprocedure Evaluation (Signed)
Anesthesia Post Note  Patient: HOGAN HOOBLER  Procedure(s) Performed: ARTHROSCOPY KNEE LOOSE BODY EXCISION (Right Knee)     Patient location during evaluation: PACU Anesthesia Type: General Level of consciousness: awake and alert Pain management: pain level controlled Vital Signs Assessment: post-procedure vital signs reviewed and stable Respiratory status: spontaneous breathing, nonlabored ventilation and respiratory function stable Cardiovascular status: blood pressure returned to baseline and stable Postop Assessment: no apparent nausea or vomiting Anesthetic complications: no   No complications documented.  Last Vitals:  Vitals:   03/20/20 1415 03/20/20 1434  BP: 127/79 121/79  Pulse: (!) 58 83  Resp: 14 16  Temp:  37.1 C  SpO2: 100% 98%    Last Pain:  Vitals:   03/20/20 1427  TempSrc:   PainSc: [place]

## 2020-03-20 NOTE — Interval H&P Note (Signed)
History and Physical Interval Note:  03/20/2020 11:30 AM  Joshua Gaines  has presented today for surgery, with the diagnosis of RIGHT KNEE [facility] [place].  The various methods of treatment have been discussed with the patient and family. After consideration of risks, benefits and other options for treatment, the patient has consented to  Procedure(s): ARTHROSCOPY KNEE LOOSE BODY EXCISION, CHONDROPLASTY (Right) as a surgical intervention.  The patient's history has been reviewed, patient examined, no change in status, stable for surgery.  I have reviewed the patient's chart and labs.  Questions were answered to the patient's satisfaction.     Hiram Gash

## 2020-03-20 NOTE — Op Note (Signed)
Orthopaedic Surgery Operative Note (CSN: [id number])  Joshua Gaines  09-Jun-1980 Date of Surgery: 03/20/2020   Diagnoses:  Right knee loose body and mechanical symptoms  Procedure: Loose body excision   Operative Finding Exam unchanged from scope findings from a few weeks ago with exception of cartilagenous loose body in suprapatellar pouch and new oseocartilagenous body in postero lateral compartment 1x1.5 cm.    Successful completion of the planned procedure.  Unfortunately patient had recurrence of mechanical symptoms after a period of resoluation after his previous scope.  He had a new loose body in a location I clearly had worked on extensively at his previous surgery.  My best assupmtion is this was loged either in the posterior folds of tissue in a way that it could not be identified or within the PCL substance.  This was a rather difficult case as his knee was tight and early osteophytes blocked much of our view.  We used a nanoscope to view from posteromedial and combined this with standard anterior viewing to remove all visibile bodies.  Post-operative plan: The patient will be WBAT.  The patient will be dc home.  DVT prophylaxis Aspirin 81 mg twice daily for 6 weeks.  Pain control with PRN pain medication preferring oral medicines.  Follow up plan will be scheduled in approximately 7 days for incision check.  Post-Op Diagnosis: Same Surgeons:Primary: Hiram Gash, MD Assistants:Caroline McBane PA-C Location: [facility] OR ROOM 6 Anesthesia: General with local Antibiotics: Ancef 2 g Tourniquet time:  Total Tourniquet Time Documented: Thigh (Right) - 52 minutes Total: Thigh (Right) - 52 minutes  Estimated Blood Loss: Minimal Complications: None Specimens: None Implants: * No implants in log *  Indications for Surgery:   Joshua Gaines is a 39 y.o. male with recent scope and previous revision ACL.  He had a period of resolution of symptoms but unfortunately started having  mechanical symptoms again.  MRI demonstated a new loose body in posterolateral comparment.  Benefits and risks of operative and nonoperative management were discussed prior to surgery with patient/guardian(s) and informed consent form was completed.  Specific risks including infection, need for additional surgery, need for arthroplasty, continued mechancial sypmtoms amongst others.   Procedure:   The patient was identified properly. Informed consent was obtained and the surgical site was marked. The patient was taken up to suite where general anesthesia was induced. The patient was placed in the supine position with a post against the surgical leg and a nonsterile tourniquet applied. The surgical leg was then prepped and draped usual sterile fashion.  A standard surgical timeout was performed.  2 standard anterior portals were made and diagnostic arthroscopy performed. Please note the findings as noted above.  We identified a small loose body in the superolateral pouch and it was removed though an accessory incision.  We then made a posteromedial portal though standard techniques with only blunt insturmentation and noted it was difficult to view so we used a nanoscope to view from posteromedial and also viewed from anderior to triangulate and avoid a posterolateral portal.  We noted the previously seen bodies that were not loose but were adherent to the posterolateral capsule but there was a new clearly new body which had not been debulked as the others had and was not adherent.  We removed this and then carefully dissected the remaining pieces away from the posterior capsule.  We then cleared the joint once again and the patient had no further signs of loose body.  Incisions closed with absorbable suture. The patient was awoken from general anesthesia and taken to the PACU in stable condition without complication.   Noemi Chapel, PA-C, present and scrubbed throughout the case, critical for completion  in a timely fashion, and for retraction, instrumentation, closure.

## 2020-03-20 NOTE — Transfer of Care (Signed)
Immediate Anesthesia Transfer of Care Note  Patient: Joshua Gaines  Procedure(s) Performed: ARTHROSCOPY KNEE LOOSE BODY EXCISION (Right Knee)  Patient Location: PACU  Anesthesia Type:General  Level of Consciousness: awake, alert  and oriented  Airway & Oxygen Therapy: Patient Spontanous Breathing and Patient connected to face mask oxygen  Post-op Assessment: Report given to RN and Post -op Vital signs reviewed and stable  Post vital signs: Reviewed and stable  Last Vitals:  Vitals Value Taken Time  BP    Temp    Pulse    Resp    SpO2      Last Pain:  Vitals:   03/20/20 1024  TempSrc: Oral  PainSc: 2       Patients Stated Pain Goal: 5 (37/44/51 4604)  Complications: No complications documented.

## 2020-03-20 NOTE — Anesthesia Preprocedure Evaluation (Signed)
Anesthesia Evaluation  Patient identified by MRN, date of birth, ID band Patient awake    Reviewed: Allergy & Precautions, NPO status , Patient's Chart, lab work & pertinent test results  History of Anesthesia Complications (+) PONV and history of anesthetic complications  Airway Mallampati: I  TM Distance: >3 FB Neck ROM: Full    Dental no notable dental hx. (+) Teeth Intact, Dental Advisory Given   Pulmonary neg pulmonary ROS,    Pulmonary exam normal breath sounds clear to auscultation       Cardiovascular negative cardio ROS Normal cardiovascular exam Rhythm:Regular Rate:Normal     Neuro/Psych negative neurological ROS  negative psych ROS   GI/Hepatic Neg liver ROS, GERD  ,  Endo/Other  negative endocrine ROS  Renal/GU negative Renal ROS  negative genitourinary   Musculoskeletal  (+) Arthritis , Osteoarthritis,    Abdominal   Peds  Hematology negative hematology ROS (+)   Anesthesia Other Findings   Reproductive/Obstetrics                             Anesthesia Physical  Anesthesia Plan  ASA: II  Anesthesia Plan: General   Post-op Pain Management:    Induction: Intravenous  PONV Risk Score and Plan: 3 and Ondansetron, Dexamethasone, Midazolam and Treatment may vary due to age or medical condition  Airway Management Planned: LMA  Additional Equipment:   Intra-op Plan:   Post-operative Plan: Extubation in OR  Informed Consent: I have reviewed the patients History and Physical, chart, labs and discussed the procedure including the risks, benefits and alternatives for the proposed anesthesia with the patient or authorized representative who has indicated his/her understanding and acceptance.     Dental advisory given  Plan Discussed with: CRNA  Anesthesia Plan Comments:         Anesthesia Quick Evaluation

## 2020-03-21 ENCOUNTER — Encounter (HOSPITAL_BASED_OUTPATIENT_CLINIC_OR_DEPARTMENT_OTHER): Payer: Self-pay | Admitting: Orthopaedic Surgery

## 2020-03-21 MED FILL — ONDANSETRON HCL 4 MG TABLET: 4 | 3 days supply | Qty: 10 | Fill #0

## 2020-04-24 ENCOUNTER — Other Ambulatory Visit (HOSPITAL_BASED_OUTPATIENT_CLINIC_OR_DEPARTMENT_OTHER): Payer: Self-pay | Admitting: Orthopaedic Surgery

## 2020-04-24 MED FILL — CEPHALEXIN 500 MG CAPSULE: 500 | 7 days supply | Qty: 28 | Fill #0

## 2020-04-28 ENCOUNTER — Encounter (HOSPITAL_BASED_OUTPATIENT_CLINIC_OR_DEPARTMENT_OTHER): Payer: Self-pay | Admitting: Orthopaedic Surgery

## 2020-04-28 ENCOUNTER — Other Ambulatory Visit: Payer: Self-pay

## 2020-04-29 ENCOUNTER — Ambulatory Visit (INDEPENDENT_AMBULATORY_CARE_PROVIDER_SITE_OTHER): Payer: 59

## 2020-04-29 ENCOUNTER — Other Ambulatory Visit (HOSPITAL_COMMUNITY)
Admission: RE | Admit: 2020-04-29 | Discharge: 2020-04-29 | Disposition: A | Discharge status: Home / Self Care | Payer: 59 | Source: Ambulatory Visit | Attending: Orthopaedic Surgery | Admitting: Orthopaedic Surgery

## 2020-04-29 ENCOUNTER — Ambulatory Visit (INDEPENDENT_AMBULATORY_CARE_PROVIDER_SITE_OTHER): Payer: 59 | Admitting: Sports Medicine

## 2020-04-29 ENCOUNTER — Other Ambulatory Visit: Payer: Self-pay | Admitting: Orthopaedic Surgery

## 2020-04-29 DIAGNOSIS — S96121A Laceration of muscle and tendon of long extensor muscle of toe at ankle and foot level, right foot, initial encounter: Secondary | ICD-10-CM | POA: Diagnosis not present

## 2020-04-29 DIAGNOSIS — M174 Other bilateral secondary osteoarthritis of knee: Secondary | ICD-10-CM

## 2020-04-29 DIAGNOSIS — Z01812 Encounter for preprocedural laboratory examination: Secondary | ICD-10-CM | POA: Insufficient documentation

## 2020-04-29 DIAGNOSIS — Z20822 Contact with and (suspected) exposure to covid-19: Secondary | ICD-10-CM | POA: Diagnosis not present

## 2020-04-29 DIAGNOSIS — M1712 Unilateral primary osteoarthritis, left knee: Secondary | ICD-10-CM

## 2020-04-29 NOTE — Assessment & Plan Note (Signed)
Joshua Gaines returns, he is a pleasant 39 year old male, he has known multifactorial knee pain, intra-articular loose bodies, he has had an arthroscopy on the left side. Unfortunately he is having locking sensations in his left knee again, unable to extend fully with about 5 to 10 degrees of extension lag. We injected his knee, and then I unlocked it by applying varus stress and slowly extending, I was able to feel a significant pop. He will return to Dr. Everardo Pacific if this recurs.

## 2020-04-29 NOTE — Assessment & Plan Note (Signed)
Joshua Gaines somehow accidentally lacerated his extensor hallucis longus tendon while cooking, he does have an appointment with Dr. Susa Simmonds for surgical repair.

## 2020-04-29 NOTE — Progress Notes (Signed)

## 2020-04-29 NOTE — Progress Notes (Signed)
    Procedures performed today:    Procedure: Real-time Ultrasound Guided injection of the left knee Device: Samsung HS60  Verbal informed consent obtained.  Time-out conducted.  Noted no overlying erythema, induration, or other signs of local infection.  Skin prepped in a sterile fashion.  Local anesthesia: Topical Ethyl chloride.  With sterile technique and under real time ultrasound guidance: 1 cc Kenalog 40, 2 cc lidocaine, 2 cc bupivacaine injected easily Completed without difficulty  Advised to call if fevers/chills, erythema, induration, drainage, or persistent bleeding.  Images permanently stored and available for review in PACS.  Impression: Technically successful ultrasound guided injection.  Independent interpretation of notes and tests performed by another provider:   None.  Brief History, Exam, Impression, and Recommendations:    Posttraumatic osteoarthritis of knee Joshua Gaines returns, he is a pleasant 39 year old male, he has known multifactorial knee pain, intra-articular loose bodies, he has had an arthroscopy on the left side. Unfortunately he is having locking sensations in his left knee again, unable to extend fully with about 5 to 10 degrees of extension lag. We injected his knee, and then I unlocked it by applying varus stress and slowly extending, I was able to feel a significant pop. He will return to Dr. Everardo Pacific if this recurs.  Laceration of right extensor hallucis longus tendon Joshua Gaines somehow accidentally lacerated his extensor hallucis longus tendon while cooking, he does have an appointment with Dr. Susa Simmonds for surgical repair.    ___________________________________________ Ihor Austin. Benjamin Stain, M.D., ABFM., CAQSM. Primary Care and Sports Medicine [facility]  Adjunct Instructor of Family Medicine  [employer]

## 2020-04-30 LAB — SARS CORONAVIRUS 2 (TAT 6-24 HRS): SARS Coronavirus 2: NEGATIVE

## 2020-05-01 ENCOUNTER — Other Ambulatory Visit (HOSPITAL_BASED_OUTPATIENT_CLINIC_OR_DEPARTMENT_OTHER): Payer: Self-pay | Admitting: Orthopaedic Surgery

## 2020-05-01 ENCOUNTER — Ambulatory Visit (HOSPITAL_BASED_OUTPATIENT_CLINIC_OR_DEPARTMENT_OTHER): Payer: 59 | Admitting: Certified Registered"

## 2020-05-01 ENCOUNTER — Other Ambulatory Visit: Payer: Self-pay

## 2020-05-01 ENCOUNTER — Encounter (HOSPITAL_BASED_OUTPATIENT_CLINIC_OR_DEPARTMENT_OTHER)
Admission: RE | Disposition: A | Discharge status: Home / Self Care | Payer: Self-pay | Source: Home / Self Care | Attending: Orthopaedic Surgery

## 2020-05-01 ENCOUNTER — Ambulatory Visit (HOSPITAL_BASED_OUTPATIENT_CLINIC_OR_DEPARTMENT_OTHER)
Admission: RE | Admit: 2020-05-01 | Discharge: 2020-05-01 | Disposition: A | Discharge status: Home / Self Care | Payer: 59 | Attending: Orthopaedic Surgery | Admitting: Orthopaedic Surgery

## 2020-05-01 ENCOUNTER — Encounter (HOSPITAL_BASED_OUTPATIENT_CLINIC_OR_DEPARTMENT_OTHER): Payer: Self-pay | Admitting: Orthopaedic Surgery

## 2020-05-01 DIAGNOSIS — S91301A Unspecified open wound, right foot, initial encounter: Secondary | ICD-10-CM | POA: Diagnosis not present

## 2020-05-01 DIAGNOSIS — K219 Gastro-esophageal reflux disease without esophagitis: Secondary | ICD-10-CM | POA: Diagnosis not present

## 2020-05-01 DIAGNOSIS — W260XXA Contact with knife, initial encounter: Secondary | ICD-10-CM | POA: Insufficient documentation

## 2020-05-01 DIAGNOSIS — S91311A Laceration without foreign body, right foot, initial encounter: Secondary | ICD-10-CM | POA: Insufficient documentation

## 2020-05-01 DIAGNOSIS — S96121A Laceration of muscle and tendon of long extensor muscle of toe at ankle and foot level, right foot, initial encounter: Secondary | ICD-10-CM | POA: Diagnosis not present

## 2020-05-01 DIAGNOSIS — I868 Varicose veins of other specified sites: Secondary | ICD-10-CM | POA: Diagnosis not present

## 2020-05-01 HISTORY — PX: TENDON REPAIR: SHX5111

## 2020-05-01 HISTORY — PX: WOUND EXPLORATION: SHX6188

## 2020-05-01 SURGERY — WOUND EXPLORATION
Anesthesia: General | Site: Foot | Laterality: Right

## 2020-05-01 MED ORDER — ONDANSETRON HCL 4 MG/2ML IJ SOLN: 4.0000 mg | Freq: Once | INTRAMUSCULAR | Status: DC | PRN

## 2020-05-01 MED ORDER — CEFAZOLIN SODIUM-DEXTROSE 2-4 GM/100ML-% IV SOLN
INTRAVENOUS | Status: AC
  Filled 2020-05-01: qty 100

## 2020-05-01 MED ORDER — LIDOCAINE HCL (CARDIAC) PF 100 MG/5ML IV SOSY
PREFILLED_SYRINGE | INTRAVENOUS | Status: DC | PRN
  Administered 2020-05-01: 80 mg via INTRAVENOUS

## 2020-05-01 MED ORDER — 0.9 % SODIUM CHLORIDE (POUR BTL) OPTIME
TOPICAL | Status: DC | PRN
  Administered 2020-05-01: 120 mL

## 2020-05-01 MED ORDER — SCOPOLAMINE 1 MG/3DAYS TD PT72
MEDICATED_PATCH | TRANSDERMAL | Status: AC
  Filled 2020-05-01: qty 1

## 2020-05-01 MED ORDER — FENTANYL CITRATE (PF) 100 MCG/2ML IJ SOLN: 25.0000 ug | INTRAMUSCULAR | Status: DC | PRN

## 2020-05-01 MED ORDER — OXYCODONE HCL 5 MG PO TABS
ORAL_TABLET | ORAL | Status: AC
  Filled 2020-05-01: qty 1

## 2020-05-01 MED ORDER — ONDANSETRON HCL 4 MG/2ML IJ SOLN
INTRAMUSCULAR | Status: AC
  Filled 2020-05-01: qty 2

## 2020-05-01 MED ORDER — MIDAZOLAM HCL 5 MG/5ML IJ SOLN
INTRAMUSCULAR | Status: DC | PRN
  Administered 2020-05-01: 2 mg via INTRAVENOUS

## 2020-05-01 MED ORDER — HYDROCODONE-ACETAMINOPHEN 5-325 MG PO TABS: 1.0000 | ORAL_TABLET | ORAL | 0 refills | Status: DC | PRN

## 2020-05-01 MED ORDER — BUPIVACAINE HCL 0.5 % IJ SOLN
INTRAMUSCULAR | Status: DC | PRN
  Administered 2020-05-01: 10 mL

## 2020-05-01 MED ORDER — ONDANSETRON HCL 4 MG/2ML IJ SOLN
INTRAMUSCULAR | Status: DC | PRN
  Administered 2020-05-01: 4 mg via INTRAVENOUS

## 2020-05-01 MED ORDER — PROPOFOL 10 MG/ML IV BOLUS
INTRAVENOUS | Status: DC | PRN
  Administered 2020-05-01: 200 mg via INTRAVENOUS

## 2020-05-01 MED ORDER — PROPOFOL 10 MG/ML IV BOLUS
INTRAVENOUS | Status: AC
  Filled 2020-05-01: qty 20

## 2020-05-01 MED ORDER — FENTANYL CITRATE (PF) 100 MCG/2ML IJ SOLN
INTRAMUSCULAR | Status: DC | PRN
  Administered 2020-05-01: 50 ug via INTRAVENOUS
  Administered 2020-05-01: 25 ug via INTRAVENOUS
  Administered 2020-05-01: 50 ug via INTRAVENOUS
  Administered 2020-05-01: 25 ug via INTRAVENOUS

## 2020-05-01 MED ORDER — OXYCODONE HCL 5 MG PO TABS
5.0000 mg | ORAL_TABLET | Freq: Once | ORAL | Status: AC | PRN
  Administered 2020-05-01: 5 mg via ORAL

## 2020-05-01 MED ORDER — SCOPOLAMINE 1 MG/3DAYS TD PT72
1.0000 | MEDICATED_PATCH | TRANSDERMAL | Status: DC
  Administered 2020-05-01: 1.5 mg via TRANSDERMAL

## 2020-05-01 MED ORDER — DEXAMETHASONE SODIUM PHOSPHATE 10 MG/ML IJ SOLN
INTRAMUSCULAR | Status: DC | PRN
  Administered 2020-05-01: 10 mg via INTRAVENOUS

## 2020-05-01 MED ORDER — MIDAZOLAM HCL 2 MG/2ML IJ SOLN
INTRAMUSCULAR | Status: AC
  Filled 2020-05-01: qty 2

## 2020-05-01 MED ORDER — LACTATED RINGERS IV SOLN: INTRAVENOUS | Status: DC

## 2020-05-01 MED ORDER — CEFAZOLIN SODIUM-DEXTROSE 2-4 GM/100ML-% IV SOLN
2.0000 g | INTRAVENOUS | Status: AC
  Administered 2020-05-01: 2 g via INTRAVENOUS

## 2020-05-01 MED ORDER — FENTANYL CITRATE (PF) 100 MCG/2ML IJ SOLN
INTRAMUSCULAR | Status: AC
  Filled 2020-05-01: qty 2

## 2020-05-01 MED ORDER — OXYCODONE HCL 5 MG/5ML PO SOLN: 5.0000 mg | Freq: Once | ORAL | Status: AC | PRN

## 2020-05-01 MED ORDER — LIDOCAINE 2% (20 MG/ML) 5 ML SYRINGE
INTRAMUSCULAR | Status: AC
  Filled 2020-05-01: qty 5

## 2020-05-01 MED FILL — HYDROCODON-APAP 5-325: 5-325 | 7 days supply | Qty: 20 | Fill #0

## 2020-05-01 SURGICAL SUPPLY — 61 items
APL PRP STRL LF DISP 70% ISPRP (MISCELLANEOUS) ×1
APL SKNCLS STERI-STRIP NONHPOA (GAUZE/BANDAGES/DRESSINGS)
BANDAGE ESMARK 6X9 LF (GAUZE/BANDAGES/DRESSINGS) ×1 IMPLANT
BENZOIN TINCTURE PRP APPL 2/3 (GAUZE/BANDAGES/DRESSINGS) IMPLANT
BLADE SURG 15 STRL LF DISP TIS (BLADE) ×2 IMPLANT
BLADE SURG 15 STRL SS (BLADE) ×4
BNDG CMPR 9X6 STRL LF SNTH (GAUZE/BANDAGES/DRESSINGS) ×1
BNDG CONFORM 2 STRL LF (GAUZE/BANDAGES/DRESSINGS) ×2 IMPLANT
BNDG ELASTIC 4X5.8 VLCR STR LF (GAUZE/BANDAGES/DRESSINGS) ×2 IMPLANT
BNDG ELASTIC 6X5.8 VLCR STR LF (GAUZE/BANDAGES/DRESSINGS) IMPLANT
BNDG ESMARK 6X9 LF (GAUZE/BANDAGES/DRESSINGS) ×2
BOOT STEPPER DURA LG (SOFTGOODS) ×2 IMPLANT
CHLORAPREP W/TINT 26 (MISCELLANEOUS) ×2 IMPLANT
COVER BACK TABLE 60X90IN (DRAPES) ×2 IMPLANT
COVER WAND RF STERILE (DRAPES) IMPLANT
CUFF TOURN SGL QUICK 34 (TOURNIQUET CUFF)
CUFF TRNQT CYL 34X4.125X (TOURNIQUET CUFF) IMPLANT
DECANTER SPIKE VIAL GLASS SM (MISCELLANEOUS) IMPLANT
DRAPE EXTREMITY T 121X128X90 (DISPOSABLE) ×2 IMPLANT
DRAPE IMP U-DRAPE 54X76 (DRAPES) ×2 IMPLANT
DRAPE U-SHAPE 47X51 STRL (DRAPES) ×2 IMPLANT
ELECT REM PT RETURN 9FT ADLT (ELECTROSURGICAL) ×2
ELECTRODE REM PT RTRN 9FT ADLT (ELECTROSURGICAL) ×1 IMPLANT
GAUZE SPONGE 4X4 12PLY STRL (GAUZE/BANDAGES/DRESSINGS) ×2 IMPLANT
GAUZE XEROFORM 1X8 LF (GAUZE/BANDAGES/DRESSINGS) ×2 IMPLANT
GLOVE SRG 8 PF TXTR STRL LF DI (GLOVE) ×1 IMPLANT
GLOVE SURG ENC TEXT LTX SZ7.5 (GLOVE) ×2 IMPLANT
GLOVE SURG SS PI 7.0 STRL IVOR (GLOVE) ×2 IMPLANT
GLOVE SURG UNDER POLY LF SZ8 (GLOVE) ×2
GOWN STRL REUS W/ TWL LRG LVL3 (GOWN DISPOSABLE) ×1 IMPLANT
GOWN STRL REUS W/ TWL XL LVL3 (GOWN DISPOSABLE) ×1 IMPLANT
GOWN STRL REUS W/TWL LRG LVL3 (GOWN DISPOSABLE) ×2
GOWN STRL REUS W/TWL XL LVL3 (GOWN DISPOSABLE) ×2
NEEDLE HYPO 22GX1.5 SAFETY (NEEDLE) ×2 IMPLANT
NS IRRIG 1000ML POUR BTL (IV SOLUTION) ×2 IMPLANT
PACK BASIN DAY SURGERY FS (CUSTOM PROCEDURE TRAY) ×2 IMPLANT
PAD CAST 4YDX4 CTTN HI CHSV (CAST SUPPLIES) ×1 IMPLANT
PADDING CAST COTTON 4X4 STRL (CAST SUPPLIES) ×2
PADDING CAST SYNTHETIC 4 (CAST SUPPLIES) ×1
PADDING CAST SYNTHETIC 4X4 STR (CAST SUPPLIES) ×1 IMPLANT
PENCIL SMOKE EVACUATOR (MISCELLANEOUS) ×2 IMPLANT
SHEET MEDIUM DRAPE 40X70 STRL (DRAPES) ×2 IMPLANT
SLEEVE SCD COMPRESS KNEE MED (MISCELLANEOUS) ×2 IMPLANT
SPLINT FIBERGLASS 4X30 (CAST SUPPLIES) ×2 IMPLANT
SPONGE LAP 18X18 RF (DISPOSABLE) IMPLANT
STOCKINETTE 6  STRL (DRAPES) ×1
STOCKINETTE 6 STRL (DRAPES) ×1 IMPLANT
STRIP CLOSURE SKIN 1/2X4 (GAUZE/BANDAGES/DRESSINGS) IMPLANT
SUCTION FRAZIER HANDLE 10FR (MISCELLANEOUS)
SUCTION TUBE FRAZIER 10FR DISP (MISCELLANEOUS) IMPLANT
SUT ETHILON 3 0 PS 1 (SUTURE) ×2 IMPLANT
SUT MNCRL AB 3-0 PS2 18 (SUTURE) ×2 IMPLANT
SUT PDS AB 2-0 CT2 27 (SUTURE) IMPLANT
SUT VIC AB 2-0 SH 27 (SUTURE) ×8
SUT VIC AB 2-0 SH 27XBRD (SUTURE) ×4 IMPLANT
SUT VIC AB 3-0 FS2 27 (SUTURE) IMPLANT
SYR BULB EAR ULCER 3OZ GRN STR (SYRINGE) ×2 IMPLANT
SYR CONTROL 10ML LL (SYRINGE) ×2 IMPLANT
TOWEL GREEN STERILE FF (TOWEL DISPOSABLE) ×4 IMPLANT
TUBE CONNECTING 20X1/4 (TUBING) IMPLANT
UNDERPAD 30X36 HEAVY ABSORB (UNDERPADS AND DIAPERS) ×2 IMPLANT

## 2020-05-01 NOTE — Discharge Instructions (Signed)
Oxycodone given at 1:30pm if needed.    Post Anesthesia Home Care Instructions  Activity: Get plenty of rest for the remainder of the day. A responsible individual must stay with you for 24 hours following the procedure.  For the next 24 hours, DO NOT: -Drive a car -Advertising copywriter -Drink alcoholic beverages -Take any medication unless instructed by your physician -Make any legal decisions or sign important papers.  Meals: Start with liquid foods such as gelatin or soup. Progress to regular foods as tolerated. Avoid greasy, spicy, heavy foods. If nausea and/or vomiting occur, drink only clear liquids until the nausea and/or vomiting subsides. Call your physician if vomiting continues.  Special Instructions/Symptoms: Your throat may feel dry or sore from the anesthesia or the breathing tube placed in your throat during surgery. If this causes discomfort, gargle with warm salt water. The discomfort should disappear within 24 hours.  If you had a scopolamine patch placed behind your ear for the management of post- operative nausea and/or vomiting:  1. The medication in the patch is effective for 72 hours, after which it should be removed.  Wrap patch in a tissue and discard in the trash. Wash hands thoroughly with soap and water. 2. You may remove the patch earlier than 72 hours if you experience unpleasant side effects which may include dry mouth, dizziness or visual disturbances. 3. Avoid touching the patch. Wash your hands with soap and water after contact with the patch.       DR. Susa Simmonds FOOT & ANKLE SURGERY POST-OP INSTRUCTIONS   Pain Management 1. The numbing medicine and your leg will last around 18 hours, take a dose of your pain medicine as soon as you feel it wearing off to avoid rebound pain. 2. Keep your foot elevated above heart level.  Make sure that your heel hangs free ('floats'). 3. Take all prescribed medication as directed. 4. If taking narcotic pain medication  you may want to use an over-the-counter stool softener to avoid constipation. 5. You may take over-the-counter NSAIDs (ibuprofen, naproxen, etc.) as well as over-the-counter acetaminophen as directed on the packaging as a supplement for your pain and may also use it to wean away from the prescription medication.  Activity ? Weightbearing as tolerated in post operative shoe. ? Keep dressing in place  First Postoperative Visit 1. Your first postop visit will be at least 2 weeks after surgery.  This should be scheduled when you schedule surgery. 2. If you do not have a postoperative visit scheduled please call [phone] to schedule an appointment. 3. At the appointment your incision will be evaluated for suture removal, x-rays will be obtained if necessary.  General Instructions 1. Swelling is very common after foot and ankle surgery.  It often takes 3 months for the foot and ankle to begin to feel comfortable.  Some amount of swelling will persist for 6-12 months. 2. DO NOT change the dressing.  If there is a problem with the dressing (too tight, loose, gets wet, etc.) please contact Dr. Donnie Mesa office. 3. DO NOT get the dressing wet.  For showers you can use an over-the-counter cast cover or wrap a washcloth around the top of your dressing and then cover it with a plastic bag and tape it to your leg. 4. DO NOT soak the incision (no tubs, pools, bath, etc.) until you have approval from Dr. Susa Simmonds.  Contact Dr. Garret Reddish office or go to Emergency Room if: 1. Temperature above 101 F. 2. Increasing pain that  is unresponsive to pain medication or elevation 3. Excessive redness or swelling in your foot 4. Dressing problems - excessive bloody drainage, looseness or tightness, or if dressing gets wet 5. Develop pain, swelling, warmth, or discoloration of your calf

## 2020-05-01 NOTE — Op Note (Signed)
Joshua Gaines male 39 y.o. 05/01/2020  PreOperative Diagnosis: Right 2 cm dorsal foot wound  PostOperative Diagnosis: 2 cm dorsal right foot wound Extensor hallucis longus tendon laceration with retraction Extensor hallucis brevis tendon laceration with retraction  PROCEDURE: Exploration of 2 cm dorsal right foot wound Primary repair of extensor houses longus tendon laceration with retraction Primary repair of extensor hallucis brevis tendon laceration with retraction Secondary closure of traumatic foot wound, 2 cm  SURGEON: Dub Mikes, MD  ASSISTANT: None  ANESTHESIA: General LMA with local infiltration of half percent Marcaine plain  FINDINGS: See below  IMPLANTS: None  INDICATIONS:39 y.o. male dropped a knife on the top of his foot.  He there is a laceration present and he had inability to extend his great toe.  Given these findings he is indicated for wound exploration and repair if needed.   Patient understood the risks, benefits and alternatives to surgery which include but are not limited to wound healing complications, infection, nonunion, malunion, need for further surgery as well as damage to surrounding structures. They also understood the potential for continued pain in that there were no guarantees of acceptable outcome After weighing these risks the patient opted to proceed with surgery.  PROCEDURE: Patient was identified in the preoperative holding area.  The right foot was marked by myself.  Consent was signed by myself and the patient. Patient was taken to the operative suite and placed supine on the operative table.  General LMA anesthesia was induced without difficulty. Bump was placed under the operative hip and bone foam was used.  All bony prominences were well padded.  Preoperative antibiotics were given. The extremity was prepped and draped in the usual sterile fashion and surgical timeout was performed.  A Esmarch tourniquet was placed about the  calf.  We began by extending the laceration proximally and distally.  This was done to allow for complete exploration.  Approximately 2 to 3 cm proximally were extended and 2 to 3 cm distal to the actual 2 cm wound.  This incisions were taken sharply down through skin and subcutaneous tissue.  Blunt dissection was used to mobilize skin flaps.  We then mobilized the traumatic wound.  There was full-thickness laceration through the skin, subcutaneous tissue, extensor retinaculum and extensor hallucis brevis tendon was completely transected as was the extensor houses longus tendon.  There is retraction of both of the proximal aspect of the tendon.  The tendon sheaths were identified and mobilized proximally.  The incision had to be taken further proximal near the level of the ankle joint to allow for access to the proximal stump of the extensor houses longus tendon.  Both extensor houses longus and extensor pollicis brevis tendons that were retracted were identified and a stay suture was placed in both of them for repair later.  Then the distal aspect of the wound was further inspected.  There is no evidence of nerve damage seen within the area of the traumatic wound.  The distal aspect of the extensor pollicis longus and brevis tendons were identified and a stay suture was placed.  Then the toe was held in a maximal dorsiflex position and the ankle in a maximal dorsiflex position and the extensor pollicis brevis tendon was repaired.  This was done with overlapping the tendon for maximal tension and using a 2-0 Vicryl suture.  We then turned our attention to the extensor pollicis longus tendon.  The tendon ends were overlapped with the ankle held in a neutral  dorsiflexed position and the toe held in maximal dorsiflexed position the tendon was repaired in an overlapping fashion using a running locking suture technique.  This was done with a 2-0 Vicryl suture.  The tendons were then oversewn with a remaining 2-0  Vicryl suture.  Then the wound was irrigated copiously with normal saline.  The edges of the traumatic wound were debrided using a scissor and the nonviable tissue discarded.  The wound was reirrigated.  Then the extensor retinacular tissue and extensor tendon sheaths were closed using a 3-0 Monocryl suture.  The skin and subcutaneous tissue was closed in a layered fashion using 3-0 Monocryl and 3-0 nylon suture.  The tourniquet was released.  A soft dressing was placed including a 2 inch Kling wrap with the toe in maximal dorsiflex position.  He was placed in a tall walking boot.  Counts were correct at the end the case.  There were no complications.  He was awake from anesthesia and taken recovery in stable condition.  POST OPERATIVE INSTRUCTIONS: Weightbearing as tolerated in the boot Keep dressing in place Avoid ankle plantarflexion if possible Follow-up in 2 weeks for wound check  TOURNIQUET TIME: Less than 1 hour  BLOOD LOSS:  Minimal         DRAINS: none         SPECIMEN: none       COMPLICATIONS:  * No complications entered in OR log *         Disposition: PACU - hemodynamically stable.         Condition: stable

## 2020-05-01 NOTE — Anesthesia Preprocedure Evaluation (Signed)
Anesthesia Evaluation  Patient identified by MRN, date of birth, ID band Patient awake    Reviewed: Allergy & Precautions, NPO status , Patient's Chart, lab work & pertinent test results  History of Anesthesia Complications (+) PONV and history of anesthetic complications  Airway Mallampati: I  TM Distance: >3 FB Neck ROM: Full    Dental no notable dental hx. (+) Teeth Intact   Pulmonary neg pulmonary ROS,    Pulmonary exam normal breath sounds clear to auscultation       Cardiovascular Normal cardiovascular exam Rhythm:Regular Rate:Normal     Neuro/Psych negative neurological ROS  negative psych ROS   GI/Hepatic Neg liver ROS, GERD  Medicated,  Endo/Other  negative endocrine ROS  Renal/GU negative Renal ROS  negative genitourinary   Musculoskeletal  (+) Arthritis , Osteoarthritis,  Right foot laceration with laceration of EHL tendon    Abdominal   Peds  Hematology negative hematology ROS (+)   Anesthesia Other Findings   Reproductive/Obstetrics                             Anesthesia Physical Anesthesia Plan  ASA: II  Anesthesia Plan: General   Post-op Pain Management:    Induction: Intravenous  PONV Risk Score and Plan: 4 or greater and Scopolamine patch - Pre-op, Midazolam, Ondansetron, Dexamethasone and Treatment may vary due to age or medical condition  Airway Management Planned: LMA  Additional Equipment:   Intra-op Plan:   Post-operative Plan: Extubation in OR  Informed Consent: I have reviewed the patients History and Physical, chart, labs and discussed the procedure including the risks, benefits and alternatives for the proposed anesthesia with the patient or authorized representative who has indicated his/her understanding and acceptance.     Dental advisory given  Plan Discussed with: CRNA and Anesthesiologist  Anesthesia Plan Comments:          Anesthesia Quick Evaluation

## 2020-05-01 NOTE — Transfer of Care (Signed)
Immediate Anesthesia Transfer of Care Note  Patient: Joshua Gaines  Procedure(s) Performed: WOUND EXPLORATION OF RIGHT FOOT (Right Foot) EXTENSOR HALLUCIS LONGUS AND BREVIS TENDON REPAIR (Right Foot)  Patient Location: PACU  Anesthesia Type:General  Level of Consciousness: awake, alert  and oriented  Airway & Oxygen Therapy: Patient Spontanous Breathing and Patient connected to face mask oxygen  Post-op Assessment: Report given to RN and Post -op Vital signs reviewed and stable  Post vital signs: Reviewed and stable  Last Vitals:  Vitals Value Taken Time  BP 124/68 05/01/20 1247  Temp 36.4 C 05/01/20 1247  Pulse 62 05/01/20 1252  Resp 14 05/01/20 1252  SpO2 100 % 05/01/20 1252  Vitals shown include unvalidated device data.  Last Pain:  Vitals:   05/01/20 1026  TempSrc: Oral  PainSc: 3       Patients Stated Pain Goal: 4 (05/01/20 1026)  Complications: No complications documented.

## 2020-05-01 NOTE — Anesthesia Postprocedure Evaluation (Signed)
Anesthesia Post Note  Patient: Joshua Gaines  Procedure(s) Performed: WOUND EXPLORATION OF RIGHT FOOT (Right Foot) EXTENSOR HALLUCIS LONGUS AND BREVIS TENDON REPAIR (Right Foot)     Patient location during evaluation: PACU Anesthesia Type: General Level of consciousness: awake and alert and oriented Pain management: pain level controlled Vital Signs Assessment: post-procedure vital signs reviewed and stable Respiratory status: spontaneous breathing, nonlabored ventilation and respiratory function stable Cardiovascular status: blood pressure returned to baseline and stable Postop Assessment: no apparent nausea or vomiting Anesthetic complications: no   No complications documented.  Last Vitals:  Vitals:   05/01/20 1300 05/01/20 1311  BP: 130/73 123/73  Pulse: 69 65  Resp: 18 16  Temp:    SpO2: 96% 98%    Last Pain:  Vitals:   05/01/20 1311  TempSrc:   PainSc: 0-No pain        RLE Motor Response: Purposeful movement;Responds to commands (05/01/20 1311) RLE Sensation: Full sensation (05/01/20 1311)      Cesar Alf A.

## 2020-05-01 NOTE — H&P (Signed)
PREOPERATIVE H&P  Chief Complaint: Right foot laceration  HPI: Joshua Gaines is a 39 y.o. male who presents for preoperative history and physical with a diagnosis of right foot laceration with EHL laceration.  Patient dropped a knife on his foot.  He was unable to extend his great toe afterwards.  He is here today for surgical exploration and repair as needed. Symptoms are rated as moderate to severe, and have been worsening.  This is significantly impairing activities of daily living.  He has elected for surgical management.   Past Medical History:  Diagnosis Date   GERD (gastroesophageal reflux disease)    PONV (postoperative nausea and vomiting)    Past Surgical History:  Procedure Laterality Date   ANTERIOR CRUCIATE LIGAMENT REPAIR  [id number]   CHONDROPLASTY Right 02/01/2018   Procedure: CHONDROPLASTY;  Surgeon: Bjorn Pippin, MD;  Location: [facility];  Service: Orthopedics;  Laterality: Right;   CHONDROPLASTY Right 02/07/2020   Procedure: CHONDROPLASTY;  Surgeon: Bjorn Pippin, MD;  Location: [facility];  Service: Orthopedics;  Laterality: Right;   HARDWARE REMOVAL Right 02/01/2018   Procedure: HARDWARE REMOVAL;  Surgeon: Bjorn Pippin, MD;  Location: [facility];  Service: Orthopedics;  Laterality: Right;   HARDWARE REMOVAL Left 06/11/2019   Procedure: HARDWARE REMOVAL;  Surgeon: Bjorn Pippin, MD;  Location: [facility];  Service: Orthopedics;  Laterality: Left;   HERNIA REPAIR  aprox 2010 or 2013   right inguinal    KNEE ARTHROSCOPY Right 03/20/2020   Procedure: ARTHROSCOPY KNEE LOOSE BODY EXCISION;  Surgeon: Bjorn Pippin, MD;  Location: [facility];  Service: Orthopedics;  Laterality: Right;   KNEE ARTHROSCOPY WITH ANTERIOR CRUCIATE LIGAMENT (ACL) REPAIR Left 06/11/2019   Procedure: KNEE ARTHROSCOPY WITH ANTERIOR CRUCIATE LIGAMENT (ACL) REPAIR WITH HAMSTRING AUTOGRAFT;  Surgeon: Bjorn Pippin, MD;   Location: [facility];  Service: Orthopedics;  Laterality: Left;   KNEE ARTHROSCOPY WITH ANTERIOR CRUCIATE LIGAMENT (ACL) REPAIR WITH HAMSTRING GRAFT Right 02/01/2018   Procedure: RIGHT KNEE ARTHROSCOPY WITH POSSIBLE REVISION ANTERIOR CRUCIATE LIGAMENT (ACL) REPAIR WITH AUTOGRAFT HAMSTRING;  Surgeon: Bjorn Pippin, MD;  Location: [facility];  Service: Orthopedics;  Laterality: Right;   KNEE ARTHROSCOPY WITH LATERAL MENISECTOMY Right 02/01/2018   Procedure: RIGHT KNEE ARTHROSCOPY WITH LATERAL MENISECTOMY;  Surgeon: Bjorn Pippin, MD;  Location: [facility];  Service: Orthopedics;  Laterality: Right;   KNEE ARTHROSCOPY WITH LATERAL MENISECTOMY Right 02/07/2020   Procedure: ARTHROSCOPY KNEE REMOVAL OF LOOSE FOREIGN BODY, PARTIAL LATERAL MENISECTOMY AND DEBRIDEMENT/SHAVING CHONDROPLASTY;  Surgeon: Bjorn Pippin, MD;  Location: [facility];  Service: Orthopedics;  Laterality: Right;   KNEE ARTHROSCOPY WITH MEDIAL MENISECTOMY Right 02/01/2018   Procedure: RIGHT KNEE ARTHROSCOPY WITH MEDIAL MENISECTOMY, CHONDROPLASTY;  Surgeon: Bjorn Pippin, MD;  Location: [facility];  Service: Orthopedics;  Laterality: Right;   MENISCUS REPAIR     left knee   Social History   Socioeconomic History   Marital status: Married    Spouse name: Not on file   Number of children: Not on file   Years of education: Not on file   Highest education level: Not on file  Occupational History   Not on file  Tobacco Use   Smoking status: Never Smoker   Smokeless tobacco: Never Used  Vaping Use   Vaping Use: Never used  Substance and Sexual Activity   Alcohol use: No  Drug use: No   Sexual activity: Yes    Partners: Female  Other Topics Concern   Not on file  Social History Narrative   Not on file   Social Determinants of Health   Financial Resource Strain: Not on file  Food Insecurity: Not on file  Transportation Needs: Not on  file  Physical Activity: Not on file  Stress: Not on file  Social Connections: Not on file   History reviewed. No pertinent family history. No Known Allergies Prior to Admission medications   Medication Sig Start Date End Date Taking? Authorizing Provider  acetaminophen (TYLENOL) 500 MG tablet Take 500 mg by mouth every 6 (six) hours as needed.   Yes [provider]  cephALEXin (KEFLEX) 250 MG capsule Take by mouth 4 (four) times daily.   Yes [provider]  oxycodone (OXY-IR) 5 MG capsule Take 5 mg by mouth every 4 (four) hours as needed.   Yes [provider]     Positive ROS: All other systems have been reviewed and were otherwise negative with the exception of those mentioned in the HPI and as above.  Physical Exam:  Vitals:   05/01/20 1026  BP: 125/78  Pulse: 68  Resp: 16  Temp: 98 F (36.7 C)  SpO2: 99%   General: Alert, no acute distress Cardiovascular: No pedal edema Respiratory: No cyanosis, no use of accessory musculature GI: No organomegaly, abdomen is soft and non-tender Skin: No lesions in the area of chief complaint Neurologic: Sensation intact distally Psychiatric: Patient is competent for consent with normal mood and affect Lymphatic: No axillary or cervical lymphadenopathy  MUSCULOSKELETAL: Right foot demonstrates 2 cm laceration on the dorsal midfoot in an oblique fashion.  He has a droopy hallux.  He is unable to extend the hallux.  He has sensation in the deep peroneal nerve distribution.  Foot is warm and well-perfused.  Assessment: Right foot dorsal laceration with presumed EHL transection   Plan: Plan for exploration of his wound and repair EHL and other structures as needed.  We will also plan for closure of the 2 cm wound.   We discussed the risks, benefits and alternatives of surgery which include but are not limited to wound healing complications, infection, nonunion, malunion, need for further surgery, damage to  surrounding structures and continued pain.  They understand there is no guarantees to an acceptable outcome.  After weighing these risks they opted to proceed with surgery.     Erle Crocker, MD    05/01/2020 11:22 AM

## 2020-05-01 NOTE — Anesthesia Procedure Notes (Signed)
Procedure Name: LMA Insertion Date/Time: 05/01/2020 11:44 AM Performed by: Lauralyn Primes, CRNA Pre-anesthesia Checklist: Patient identified, Emergency Drugs available, Suction available and Patient being monitored Patient Re-evaluated:Patient Re-evaluated prior to induction Oxygen Delivery Method: Circle system utilized Preoxygenation: Pre-oxygenation with 100% oxygen Induction Type: IV induction Ventilation: Mask ventilation without difficulty LMA: LMA inserted LMA Size: 5.0 Number of attempts: 1 Airway Equipment and Method: Bite block Placement Confirmation: positive ETCO2 Tube secured with: Tape Dental Injury: Teeth and Oropharynx as per pre-operative assessment

## 2020-05-05 ENCOUNTER — Encounter (HOSPITAL_BASED_OUTPATIENT_CLINIC_OR_DEPARTMENT_OTHER): Payer: Self-pay | Admitting: Orthopaedic Surgery

## 2020-06-16 ENCOUNTER — Ambulatory Visit: Payer: 59 | Attending: Internal Medicine

## 2020-06-16 DIAGNOSIS — Z23 Encounter for immunization: Secondary | ICD-10-CM

## 2020-06-23 ENCOUNTER — Ambulatory Visit (INDEPENDENT_AMBULATORY_CARE_PROVIDER_SITE_OTHER): Payer: 59

## 2020-06-23 ENCOUNTER — Other Ambulatory Visit: Payer: Self-pay

## 2020-06-23 ENCOUNTER — Ambulatory Visit: Payer: 59 | Admitting: Sports Medicine

## 2020-06-23 DIAGNOSIS — M25761 Osteophyte, right knee: Secondary | ICD-10-CM | POA: Diagnosis not present

## 2020-06-23 DIAGNOSIS — M25562 Pain in left knee: Secondary | ICD-10-CM | POA: Diagnosis not present

## 2020-06-23 DIAGNOSIS — Z9889 Other specified postprocedural states: Secondary | ICD-10-CM | POA: Diagnosis not present

## 2020-06-23 DIAGNOSIS — M174 Other bilateral secondary osteoarthritis of knee: Secondary | ICD-10-CM

## 2020-06-23 DIAGNOSIS — M25462 Effusion, left knee: Secondary | ICD-10-CM | POA: Diagnosis not present

## 2020-06-23 DIAGNOSIS — M76891 Other specified enthesopathies of right lower limb, excluding foot: Secondary | ICD-10-CM | POA: Diagnosis not present

## 2020-06-23 DIAGNOSIS — M1732 Unilateral post-traumatic osteoarthritis, left knee: Secondary | ICD-10-CM | POA: Diagnosis not present

## 2020-06-23 NOTE — Progress Notes (Addendum)
° ° °  Procedures performed today:    Procedure: Real-time Ultrasound Guided injection of the left knee Device: Samsung HS60  Verbal informed consent obtained.  Time-out conducted.  Noted no overlying erythema, induration, or other signs of local infection.  Skin prepped in a sterile fashion.  Local anesthesia: Topical Ethyl chloride.  With sterile technique and under real time ultrasound guidance:  No effusion noted, 3 cc lidocaine, 3 cc bupivacaine injected easily.   Completed without difficulty  Advised to call if fevers/chills, erythema, induration, drainage, or persistent bleeding.  Images permanently stored and available for review in PACS.  Impression: Technically successful ultrasound guided injection.  Independent interpretation of notes and tests performed by another provider:   MRI did not show any intra-articular loose bodies but x-ray confirms an intra-articular loose body in the lateral compartment.  This likely fell between the slices of the MRI.  Brief History, Exam, Impression, and Recommendations:    Posttraumatic osteoarthritis of knee Joshua Gaines again, pleasant 40 year old male, multifactorial left knee pain, intra-articular loose bodies, are multiple arthroscopies, recurrence of locking sensations, we did unlock the knee approximately 2 months ago. Positive McMurray sign. Today we injected with lidocaine, bupivacaine, I again applied varus stress and slowly extend the knee. He really does need to follow-up with Dr. Griffin Basil.  MRI did not show any intra-articular loose bodies but x-ray confirms an intra-articular loose body in the lateral compartment.  This likely fell between the slices of the MRI.  This needs to be removed by Dr. Griffin Basil.    ___________________________________________ Joshua Her. Joshua Gaines, M.D., ABFM., CAQSM. Primary Care and [place] Instructor of [place] [employer] [facility] [employer]

## 2020-06-23 NOTE — Assessment & Plan Note (Addendum)
Joshua Gaines returns again, pleasant 40 year old male, multifactorial left knee pain, intra-articular loose bodies, are multiple arthroscopies, recurrence of locking sensations, we did unlock the knee approximately 2 months ago. Positive McMurray sign. Today we injected with lidocaine, bupivacaine, I again applied varus stress and slowly extend the knee. He really does need to follow-up with Dr. Griffin Basil.  MRI did not show any intra-articular loose bodies but x-ray confirms an intra-articular loose body in the lateral compartment.  This likely fell between the slices of the MRI.  This needs to be removed by Dr. Griffin Basil.

## 2020-06-24 ENCOUNTER — Ambulatory Visit: Payer: 59 | Admitting: Sports Medicine

## 2020-06-30 ENCOUNTER — Other Ambulatory Visit: Payer: Self-pay

## 2020-06-30 ENCOUNTER — Ambulatory Visit: Payer: 59 | Attending: Sports Medicine | Admitting: Physical Therapy

## 2020-06-30 ENCOUNTER — Encounter: Payer: Self-pay | Admitting: Physical Therapy

## 2020-06-30 DIAGNOSIS — R531 Weakness: Secondary | ICD-10-CM

## 2020-06-30 DIAGNOSIS — M25571 Pain in right ankle and joints of right foot: Secondary | ICD-10-CM | POA: Diagnosis present
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
## 2020-07-09 ENCOUNTER — Encounter: Payer: Self-pay | Admitting: Physical Therapy

## 2020-07-09 ENCOUNTER — Ambulatory Visit: Payer: 59 | Attending: Sports Medicine | Admitting: Physical Therapy

## 2020-07-09 ENCOUNTER — Other Ambulatory Visit: Payer: Self-pay

## 2020-07-09 DIAGNOSIS — M25562 Pain in left knee: Secondary | ICD-10-CM | POA: Insufficient documentation

## 2020-07-09 DIAGNOSIS — R600 Localized edema: Secondary | ICD-10-CM | POA: Diagnosis present

## 2020-07-09 DIAGNOSIS — M25571 Pain in right ankle and joints of right foot: Secondary | ICD-10-CM | POA: Insufficient documentation

## 2020-07-09 DIAGNOSIS — G8929 Other chronic pain: Secondary | ICD-10-CM | POA: Insufficient documentation

## 2020-07-09 DIAGNOSIS — M6281 Muscle weakness (generalized): Secondary | ICD-10-CM | POA: Insufficient documentation

## 2020-07-09 DIAGNOSIS — R531 Weakness: Secondary | ICD-10-CM | POA: Insufficient documentation

## 2020-07-09 DIAGNOSIS — M25662 Stiffness of left knee, not elsewhere classified: Secondary | ICD-10-CM | POA: Diagnosis present

## 2020-07-09 NOTE — Therapy (Signed)
[place], [place], [address] Phone: [phone]   Fax:  [phone]  Physical Therapy Treatment  Patient Details  Name: Joshua Gaines MRN: [id number] Date of Birth: [birth year] Referring Provider (PT): Radene Journey MD   Encounter Date: 07/09/2020   PT End of Session - 07/09/20 1420    Visit Number 2    Number of Visits 7    Date for PT Re-Evaluation 08/25/20    Authorization Type [facility]    PT Start Time 2878    PT Stop Time 1459    PT Time Calculation (min) 41 min    Activity Tolerance Patient tolerated treatment well    Behavior During Therapy [facility] for tasks assessed/performed           Past Medical History:  Diagnosis Date  . GERD (gastroesophageal reflux disease)   . PONV (postoperative nausea and vomiting)     Past Surgical History:  Procedure Laterality Date  . ANTERIOR CRUCIATE LIGAMENT REPAIR  [id number]  . CHONDROPLASTY Right 02/01/2018   Procedure: CHONDROPLASTY;  Surgeon: Hiram Gash, MD;  Location: [place];  Service: Orthopedics;  Laterality: Right;  . CHONDROPLASTY Right 02/07/2020   Procedure: CHONDROPLASTY;  Surgeon: Hiram Gash, MD;  Location: [place];  Service: Orthopedics;  Laterality: Right;  . HARDWARE REMOVAL Right 02/01/2018   Procedure: HARDWARE REMOVAL;  Surgeon: Hiram Gash, MD;  Location: [place];  Service: Orthopedics;  Laterality: Right;  . HARDWARE REMOVAL Left 06/11/2019   Procedure: HARDWARE REMOVAL;  Surgeon: Hiram Gash, MD;  Location: [place];  Service: Orthopedics;  Laterality: Left;  . HERNIA REPAIR  aprox 2010 or 2013   right inguinal   . KNEE ARTHROSCOPY Right 03/20/2020   Procedure: ARTHROSCOPY KNEE LOOSE BODY EXCISION;  Surgeon: Hiram Gash, MD;  Location: [place];  Service: Orthopedics;  Laterality: Right;  . KNEE ARTHROSCOPY WITH ANTERIOR CRUCIATE LIGAMENT (ACL)  REPAIR Left 06/11/2019   Procedure: KNEE ARTHROSCOPY WITH ANTERIOR CRUCIATE LIGAMENT (ACL) REPAIR WITH HAMSTRING AUTOGRAFT;  Surgeon: Hiram Gash, MD;  Location: [place];  Service: Orthopedics;  Laterality: Left;  . KNEE ARTHROSCOPY WITH ANTERIOR CRUCIATE LIGAMENT (ACL) REPAIR WITH HAMSTRING GRAFT Right 02/01/2018   Procedure: RIGHT KNEE ARTHROSCOPY WITH POSSIBLE REVISION ANTERIOR CRUCIATE LIGAMENT (ACL) REPAIR WITH AUTOGRAFT HAMSTRING;  Surgeon: Hiram Gash, MD;  Location: [place];  Service: Orthopedics;  Laterality: Right;  . KNEE ARTHROSCOPY WITH LATERAL MENISECTOMY Right 02/01/2018   Procedure: RIGHT KNEE ARTHROSCOPY WITH LATERAL MENISECTOMY;  Surgeon: Hiram Gash, MD;  Location: [place];  Service: Orthopedics;  Laterality: Right;  . KNEE ARTHROSCOPY WITH LATERAL MENISECTOMY Right 02/07/2020   Procedure: ARTHROSCOPY KNEE REMOVAL OF LOOSE FOREIGN BODY, PARTIAL LATERAL MENISECTOMY AND DEBRIDEMENT/SHAVING CHONDROPLASTY;  Surgeon: Hiram Gash, MD;  Location: [place];  Service: Orthopedics;  Laterality: Right;  . KNEE ARTHROSCOPY WITH MEDIAL MENISECTOMY Right 02/01/2018   Procedure: RIGHT KNEE ARTHROSCOPY WITH MEDIAL MENISECTOMY, CHONDROPLASTY;  Surgeon: Hiram Gash, MD;  Location: [place];  Service: Orthopedics;  Laterality: Right;  . MENISCUS REPAIR     left knee  . TENDON REPAIR Right 05/01/2020   Procedure: EXTENSOR HALLUCIS LONGUS AND BREVIS TENDON REPAIR;  Surgeon: Erle Crocker, MD;  Location: [place];  Service: Orthopedics;  Laterality: Right;  . WOUND EXPLORATION Right 05/01/2020   Procedure: WOUND  EXPLORATION OF RIGHT FOOT;  Surgeon: Erle Crocker, MD;  Location: [place];  Service: Orthopedics;  Laterality: Right;    There were no vitals filed for this visit.       [facility] PT Assessment - 07/09/20 0001      Assessment   Medical Diagnosis EHL,  EHB repair    Referring Provider (PT) Radene Journey MD                         [facility] Adult PT Treatment/Exercise - 07/09/20 0001      Exercises   Exercises Ankle      Ankle Exercises: Stretches   Slant Board Stretch 2 reps;30 seconds   gastroc     Ankle Exercises: Aerobic   Elliptical ramp L 10, L 5 x 5 min      Ankle Exercises: Plyometrics   Plyometric Exercises pre-jumping bil PF into squat 1 x 10    Plyometric Exercises small bil jumps in place 1 x 20    Plyometric Exercises 4 square figure 8 jumps 1 x 10    Plyometric Exercises carioca 4 x 50 ft      Ankle Exercises: Standing   BAPS Level 3;Sitting   DF/PF, inversion/ eversion, CW/CCW   Heel Walk (Round Trip) 2 x 20 ft    Toe Walk (Round Trip) 2 x 20 ft    Other Standing Ankle Exercises standing on airex pad rocking foward pressing great toe into airex pad 2 x 20                  PT Education - 07/09/20 1511    Education Details Reviewed HEP    Person(s) Educated Patient    Methods Explanation;Verbal cues;Handout    Comprehension Verbalized understanding;Verbal cues required            PT Short Term Goals - 06/30/20 1807      PT SHORT TERM GOAL #1   Title pt to be I with inital HEp     Period Weeks    Status New    Target Date 07/14/20      PT SHORT TERM GOAL #2   Title -             PT Long Term Goals - 06/30/20 1807      PT LONG TERM GOAL #1   Title pt to increase Gross R ankle strength to 5/5 to promote stability    Baseline -    Time 6    Period Weeks    Status New    Target Date 08/11/20      PT LONG TERM GOAL #2   Title pt to demo RLE strength to 5/5 in all planes to promote knee stability with standing/ walking and dynamic activities    Time 4    Period Weeks    Status New    Target Date 08/11/20      PT LONG TERM GOAL #3   Title pt to be able to perfor dynamic activities and jogging/ hopping with no report of pain or instability for personal goal of returning to  sport    Time 4    Period Weeks    Status New    Target Date 08/11/20      PT LONG TERM GOAL #4   Title increase FOTO score to to >/= 85% to demo improvement in function    Time 4    Period  Weeks    Status New    Target Date 08/11/20      PT LONG TERM GOAL #5   Title pt to be I with all HEP given  and is able to  progress current LOF iND    Time 4    Period Weeks    Status New    Target Date 08/11/20                 Plan - 07/09/20 1501    Clinical Impression Statement pt arrives to PT reporting no pain and has been consistent with his HEP. Continued working on ankle stability and motor control which he did well with but did report feeling "odd" during airex exercise. began working on [employer] which he did well focusing on double limb jumping/ landing.  No report of pain or issues following session.    PT Treatment/Interventions ADLs/Self Care Home Management;Gait training;Stair training;Functional mobility training;Therapeutic activities;Therapeutic exercise;Balance training;Neuromuscular re-education;Manual techniques;Dry needling;Taping;Patient/family education    PT Next Visit Plan review/ update HEP, ankle strengthening,balance, ellipitical vs treadmill, jumping, acivities, double limb progress to intermittent single leg    PT Home Exercise Plan [id number] - great toe extension, toe yoga, ankle strengtheing, SLS balance    Consulted and Agree with Plan of Care Patient           Patient will benefit from skilled therapeutic intervention in order to improve the following deficits and impairments:  Improper body mechanics,Decreased strength,Decreased activity tolerance,Decreased balance  Visit Diagnosis: Pain in right ankle and joints of right foot  General weakness  Chronic pain of left knee  Localized edema  Stiffness of left knee, not elsewhere classified  Muscle weakness (generalized)     Problem List Patient Active Problem List    Diagnosis Date Noted  . Laceration of right extensor hallucis longus tendon 04/29/2020  . Left varicocele 08/22/2018  . Right Pelvic Subcutaneous Nodule 08/22/2018  . Annual physical exam 12/23/2016  . Posttraumatic osteoarthritis of knee 02/27/2015  . Right foot pain 01/19/2013    Starr Lake PT, DPT, LAT, ATC  07/09/20  3:12 PM      [place] [facility] [address] [place], [place], [address] Phone: [phone]   Fax:  [phone]  Name: FREEMON BINFORD MRN: [id number] Date of Birth: [birth year]

## 2020-07-16 ENCOUNTER — Ambulatory Visit: Payer: 59 | Admitting: Physical Therapy

## 2020-07-23 ENCOUNTER — Other Ambulatory Visit: Payer: Self-pay

## 2020-07-23 ENCOUNTER — Ambulatory Visit: Payer: 59 | Admitting: Physical Therapy

## 2020-07-23 DIAGNOSIS — R531 Weakness: Secondary | ICD-10-CM

## 2020-07-23 DIAGNOSIS — M25571 Pain in right ankle and joints of right foot: Secondary | ICD-10-CM
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
## 2020-07-30 ENCOUNTER — Encounter: Payer: Self-pay | Admitting: Physical Therapy

## 2020-07-30 ENCOUNTER — Other Ambulatory Visit: Payer: Self-pay

## 2020-07-30 ENCOUNTER — Ambulatory Visit: Payer: 59 | Admitting: Physical Therapy

## 2020-07-30 DIAGNOSIS — M6281 Muscle weakness (generalized): Secondary | ICD-10-CM | POA: Diagnosis not present

## 2020-07-30 DIAGNOSIS — G8929 Other chronic pain: Secondary | ICD-10-CM | POA: Diagnosis not present

## 2020-07-30 DIAGNOSIS — R600 Localized edema: Secondary | ICD-10-CM | POA: Diagnosis not present

## 2020-07-30 DIAGNOSIS — M25571 Pain in right ankle and joints of right foot: Secondary | ICD-10-CM

## 2020-07-30 DIAGNOSIS — R531 Weakness: Secondary | ICD-10-CM

## 2020-07-30 DIAGNOSIS — M25562 Pain in left knee: Secondary | ICD-10-CM | POA: Diagnosis not present

## 2020-07-30 DIAGNOSIS — M25662 Stiffness of left knee, not elsewhere classified: Secondary | ICD-10-CM | POA: Diagnosis not present

## 2020-07-30 NOTE — Therapy (Signed)
[place], [place], [address] Phone: [phone]   Fax:  [phone]  Physical Therapy Treatment / Discharge  Patient Details  Name: Joshua Gaines MRN: [id number] Date of Birth: Apr 13, 1981 Referring Provider (PT): Radene Journey MD   Encounter Date: 07/30/2020   PT End of Session - 07/30/20 1509    Visit Number 3    Number of Visits 7    Date for PT Re-Evaluation 08/25/20    Authorization Type [facility]    PT Start Time 1638    PT Stop Time 1530    PT Time Calculation (min) 24 min           Past Medical History:  Diagnosis Date  . GERD (gastroesophageal reflux disease)   . PONV (postoperative nausea and vomiting)     Past Surgical History:  Procedure Laterality Date  . ANTERIOR CRUCIATE LIGAMENT REPAIR  [id number]  . CHONDROPLASTY Right 02/01/2018   Procedure: CHONDROPLASTY;  Surgeon: Hiram Gash, MD;  Location: [place];  Service: Orthopedics;  Laterality: Right;  . CHONDROPLASTY Right 02/07/2020   Procedure: CHONDROPLASTY;  Surgeon: Hiram Gash, MD;  Location: [place];  Service: Orthopedics;  Laterality: Right;  . HARDWARE REMOVAL Right 02/01/2018   Procedure: HARDWARE REMOVAL;  Surgeon: Hiram Gash, MD;  Location: [place];  Service: Orthopedics;  Laterality: Right;  . HARDWARE REMOVAL Left 06/11/2019   Procedure: HARDWARE REMOVAL;  Surgeon: Hiram Gash, MD;  Location: [place];  Service: Orthopedics;  Laterality: Left;  . HERNIA REPAIR  aprox 2010 or 2013   right inguinal   . KNEE ARTHROSCOPY Right 03/20/2020   Procedure: ARTHROSCOPY KNEE LOOSE BODY EXCISION;  Surgeon: Hiram Gash, MD;  Location: [place];  Service: Orthopedics;  Laterality: Right;  . KNEE ARTHROSCOPY WITH ANTERIOR CRUCIATE LIGAMENT (ACL) REPAIR Left 06/11/2019   Procedure: KNEE ARTHROSCOPY WITH ANTERIOR CRUCIATE LIGAMENT (ACL) REPAIR WITH  HAMSTRING AUTOGRAFT;  Surgeon: Hiram Gash, MD;  Location: [place];  Service: Orthopedics;  Laterality: Left;  . KNEE ARTHROSCOPY WITH ANTERIOR CRUCIATE LIGAMENT (ACL) REPAIR WITH HAMSTRING GRAFT Right 02/01/2018   Procedure: RIGHT KNEE ARTHROSCOPY WITH POSSIBLE REVISION ANTERIOR CRUCIATE LIGAMENT (ACL) REPAIR WITH AUTOGRAFT HAMSTRING;  Surgeon: Hiram Gash, MD;  Location: [place];  Service: Orthopedics;  Laterality: Right;  . KNEE ARTHROSCOPY WITH LATERAL MENISECTOMY Right 02/01/2018   Procedure: RIGHT KNEE ARTHROSCOPY WITH LATERAL MENISECTOMY;  Surgeon: Hiram Gash, MD;  Location: [place];  Service: Orthopedics;  Laterality: Right;  . KNEE ARTHROSCOPY WITH LATERAL MENISECTOMY Right 02/07/2020   Procedure: ARTHROSCOPY KNEE REMOVAL OF LOOSE FOREIGN BODY, PARTIAL LATERAL MENISECTOMY AND DEBRIDEMENT/SHAVING CHONDROPLASTY;  Surgeon: Hiram Gash, MD;  Location: [place];  Service: Orthopedics;  Laterality: Right;  . KNEE ARTHROSCOPY WITH MEDIAL MENISECTOMY Right 02/01/2018   Procedure: RIGHT KNEE ARTHROSCOPY WITH MEDIAL MENISECTOMY, CHONDROPLASTY;  Surgeon: Hiram Gash, MD;  Location: [place];  Service: Orthopedics;  Laterality: Right;  . MENISCUS REPAIR     left knee  . TENDON REPAIR Right 05/01/2020   Procedure: EXTENSOR HALLUCIS LONGUS AND BREVIS TENDON REPAIR;  Surgeon: Erle Crocker, MD;  Location: [place];  Service: Orthopedics;  Laterality: Right;  . WOUND EXPLORATION Right 05/01/2020   Procedure: WOUND EXPLORATION OF RIGHT FOOT;  Surgeon: Erle Crocker, MD;  Location: [place];  Service: Orthopedics;  Laterality: Right;    There were no vitals filed for this visit.   Subjective Assessment - 07/30/20 1511    Subjective " no issues"              [facility] PT Assessment - 07/30/20 0001      Assessment   Medical Diagnosis EHL, EHB repair     Referring Provider (PT) Radene Journey MD                         [facility] Adult PT Treatment/Exercise - 07/30/20 0001      Ankle Exercises: Plyometrics   Plyometric Exercises dribbling basketball sprinting / cutting in multiple directions 4 x 50 ft    Plyometric Exercises double limb jumps 2x 20 ft, single limb jumps 2 x 10 ft    Plyometric Exercises lateral shuffling 6 x 25 ft tossing basketball back and forth                  PT Education - 07/30/20 1548    Education Details Reviewed HEP    Person(s) Educated Patient    Methods Explanation;Verbal cues    Comprehension Verbalized understanding;Verbal cues required            PT Short Term Goals - 07/30/20 1549      PT SHORT TERM GOAL #1   Title pt to be I with inital HEp     Period Weeks    Status Achieved             PT Long Term Goals - 07/30/20 1549      PT LONG TERM GOAL #1   Title pt to increase Gross R ankle strength to 5/5 to promote stability    Period Weeks    Status Achieved      PT LONG TERM GOAL #2   Title pt to demo RLE strength to 5/5 in all planes to promote knee stability with standing/ walking and dynamic activities    Period Weeks    Status Achieved      PT LONG TERM GOAL #3   Title pt to be able to perfor dynamic activities and jogging/ hopping with no report of pain or instability for personal goal of returning to sport    Period Weeks    Status Achieved      PT LONG TERM GOAL #5   Title pt to be I with all HEP given  and is able to  progress current LOF iND    Status Achieved                 Plan - 07/30/20 1545    Clinical Impression Statement Joshua Gaines has made excellent progrss with physical therapy reporting no pain today. focused session on high  level dynamic/ plyometric activities which he did well with. no report of pain during or following session in the foot/ toe. he is able to maintain and progress current LOF IND and will be dishcarged from PT today.     PT Treatment/Interventions ADLs/Self Care Home Management;Gait training;Stair training;Functional mobility training;Therapeutic activities;Therapeutic exercise;Balance training;Neuromuscular re-education;Manual techniques;Dry needling;Taping;Patient/family education    PT Next Visit Plan D/C    Consulted and Agree with Plan of Care Patient           Patient will benefit from skilled therapeutic intervention in order to improve the following deficits and impairments:  Improper body mechanics,Decreased strength,Decreased activity tolerance,Decreased balance  Visit Diagnosis: Pain in  right ankle and joints of right foot  General weakness     Problem List Patient Active Problem List   Diagnosis Date Noted  . Laceration of right extensor hallucis longus tendon 04/29/2020  . Left varicocele 08/22/2018  . Right Pelvic Subcutaneous Nodule 08/22/2018  . Annual physical exam 12/23/2016  . Posttraumatic osteoarthritis of knee 02/27/2015  . Right foot pain 01/19/2013    [place] 07/30/2020, 3:50 PM  [facility] [address] [place], [place], [address] Phone: [phone]   Fax:  [phone]  Name: Joshua Gaines MRN: [id number] Date of Birth: 1980/10/14      PHYSICAL THERAPY DISCHARGE SUMMARY  Visits from Start of Care: 3  Current functional level related to goals / functional outcomes: See goals   Remaining deficits: See assessment   Education / Equipment: HEP, theraband, posture  Plan: Patient agrees to discharge.  Patient goals were met. Patient is being discharged due to being pleased with the current functional level.  ?????        Remmington Teters PT, DPT, LAT, ATC  07/30/20  3:51 PM

## 2020-08-18 ENCOUNTER — Telehealth: Payer: Self-pay | Admitting: *Deleted

## 2020-08-18 ENCOUNTER — Other Ambulatory Visit (HOSPITAL_BASED_OUTPATIENT_CLINIC_OR_DEPARTMENT_OTHER): Payer: Self-pay

## 2020-08-18 MED ORDER — OSELTAMIVIR PHOSPHATE 75 MG PO CAPS
75.0000 mg | ORAL_CAPSULE | Freq: Every day | ORAL | 0 refills | Status: DC
  Filled 2020-08-18: qty 10, 10d supply, fill #0

## 2020-08-18 NOTE — Telephone Encounter (Signed)
Pt left vm stating that his son tested positive for Flu A yesterday and would like for you to send him in some Tamiflu.

## 2020-08-18 NOTE — Telephone Encounter (Signed)
Absolutely, sending in the prophylactic dosing, I am assuming he has no symptoms.

## 2021-02-26 ENCOUNTER — Other Ambulatory Visit (HOSPITAL_BASED_OUTPATIENT_CLINIC_OR_DEPARTMENT_OTHER): Payer: Self-pay

## 2021-04-07 ENCOUNTER — Ambulatory Visit (INDEPENDENT_AMBULATORY_CARE_PROVIDER_SITE_OTHER): Payer: 59 | Admitting: Sports Medicine

## 2021-04-07 ENCOUNTER — Other Ambulatory Visit: Payer: Self-pay

## 2021-04-07 ENCOUNTER — Telehealth: Payer: Self-pay | Admitting: Sports Medicine

## 2021-04-07 VITALS — BP 128/83 | HR 70 | Wt 188.1 lb

## 2021-04-07 DIAGNOSIS — Z0000 Encounter for general adult medical examination without abnormal findings: Secondary | ICD-10-CM

## 2021-04-07 DIAGNOSIS — M174 Other bilateral secondary osteoarthritis of knee: Secondary | ICD-10-CM

## 2021-04-07 NOTE — Progress Notes (Signed)
Subjective:    CC: Annual Physical Exam  HPI:  This patient is here for their annual physical  I reviewed the past medical history, family history, social history, surgical history, and allergies today and no changes were needed.  Please see the problem list section below in epic for further details.  Past Medical History: Past Medical History:  Diagnosis Date   GERD (gastroesophageal reflux disease)    PONV (postoperative nausea and vomiting)    Past Surgical History: Past Surgical History:  Procedure Laterality Date   ANTERIOR CRUCIATE LIGAMENT REPAIR  [id number]   CHONDROPLASTY Right 02/01/2018   Procedure: CHONDROPLASTY;  Surgeon: Hiram Gash, MD;  Location: [place];  Service: Orthopedics;  Laterality: Right;   CHONDROPLASTY Right 02/07/2020   Procedure: CHONDROPLASTY;  Surgeon: Hiram Gash, MD;  Location: [place];  Service: Orthopedics;  Laterality: Right;   HARDWARE REMOVAL Right 02/01/2018   Procedure: HARDWARE REMOVAL;  Surgeon: Hiram Gash, MD;  Location: [place];  Service: Orthopedics;  Laterality: Right;   HARDWARE REMOVAL Left 06/11/2019   Procedure: HARDWARE REMOVAL;  Surgeon: Hiram Gash, MD;  Location: [place];  Service: Orthopedics;  Laterality: Left;   HERNIA REPAIR  aprox 2010 or 2013   right inguinal    KNEE ARTHROSCOPY Right 03/20/2020   Procedure: ARTHROSCOPY KNEE LOOSE BODY EXCISION;  Surgeon: Hiram Gash, MD;  Location: [place];  Service: Orthopedics;  Laterality: Right;   KNEE ARTHROSCOPY WITH ANTERIOR CRUCIATE LIGAMENT (ACL) REPAIR Left 06/11/2019   Procedure: KNEE ARTHROSCOPY WITH ANTERIOR CRUCIATE LIGAMENT (ACL) REPAIR WITH HAMSTRING AUTOGRAFT;  Surgeon: Hiram Gash, MD;  Location: [place];  Service: Orthopedics;  Laterality: Left;   KNEE ARTHROSCOPY WITH ANTERIOR CRUCIATE LIGAMENT (ACL) REPAIR WITH HAMSTRING GRAFT Right 02/01/2018   Procedure:  RIGHT KNEE ARTHROSCOPY WITH POSSIBLE REVISION ANTERIOR CRUCIATE LIGAMENT (ACL) REPAIR WITH AUTOGRAFT HAMSTRING;  Surgeon: Hiram Gash, MD;  Location: [place];  Service: Orthopedics;  Laterality: Right;   KNEE ARTHROSCOPY WITH LATERAL MENISECTOMY Right 02/01/2018   Procedure: RIGHT KNEE ARTHROSCOPY WITH LATERAL MENISECTOMY;  Surgeon: Hiram Gash, MD;  Location: [place];  Service: Orthopedics;  Laterality: Right;   KNEE ARTHROSCOPY WITH LATERAL MENISECTOMY Right 02/07/2020   Procedure: ARTHROSCOPY KNEE REMOVAL OF LOOSE FOREIGN BODY, PARTIAL LATERAL MENISECTOMY AND DEBRIDEMENT/SHAVING CHONDROPLASTY;  Surgeon: Hiram Gash, MD;  Location: [place];  Service: Orthopedics;  Laterality: Right;   KNEE ARTHROSCOPY WITH MEDIAL MENISECTOMY Right 02/01/2018   Procedure: RIGHT KNEE ARTHROSCOPY WITH MEDIAL MENISECTOMY, CHONDROPLASTY;  Surgeon: Hiram Gash, MD;  Location: [place];  Service: Orthopedics;  Laterality: Right;   MENISCUS REPAIR     left knee   TENDON REPAIR Right 05/01/2020   Procedure: EXTENSOR HALLUCIS LONGUS AND BREVIS TENDON REPAIR;  Surgeon: Erle Crocker, MD;  Location: [place];  Service: Orthopedics;  Laterality: Right;   WOUND EXPLORATION Right 05/01/2020   Procedure: WOUND EXPLORATION OF RIGHT FOOT;  Surgeon: Erle Crocker, MD;  Location: [place];  Service: Orthopedics;  Laterality: Right;   Social History: Social History   Socioeconomic History   Marital status: Married    Spouse name: Not on file   Number of children: Not on file   Years of education: Not on file   Highest education level: Not on file  Occupational History   Not on file  Tobacco  Use   Smoking status: Never   Smokeless tobacco: Never  Vaping Use   Vaping Use: Never used  Substance and Sexual Activity   Alcohol use: No   Drug use: No   Sexual activity: Yes    Partners: Female  Other  Topics Concern   Not on file  Social History Narrative   Not on file   Social Determinants of Health   Financial Resource Strain: Not on file  Food Insecurity: Not on file  Transportation Needs: Not on file  Physical Activity: Not on file  Stress: Not on file  Social Connections: Not on file   Family History: No family history on file. Allergies: No Known Allergies Medications: See med rec.  Review of Systems: No headache, visual changes, nausea, vomiting, diarrhea, constipation, dizziness, abdominal pain, skin rash, fevers, chills, night sweats, swollen lymph nodes, weight loss, chest pain, body aches, joint swelling, muscle aches, shortness of breath, mood changes, visual or auditory hallucinations.  Objective:    General: Well Developed, well nourished, and in no acute distress.  Neuro: Alert and oriented x3, extra-ocular muscles intact, sensation grossly intact. Cranial nerves II through XII are intact, motor, sensory, and coordinative functions are all intact. HEENT: Normocephalic, atraumatic, pupils equal round reactive to light, neck supple, no masses, no lymphadenopathy, thyroid nonpalpable. Oropharynx, nasopharynx, external ear canals are unremarkable. Skin: Warm and dry, no rashes noted.  Cardiac: Regular rate and rhythm, no murmurs rubs or gallops.  Respiratory: Clear to auscultation bilaterally. Not using accessory muscles, speaking in full sentences.  Abdominal: Soft, nontender, nondistended, positive bowel sounds, no masses, no organomegaly.  Musculoskeletal: Shoulder, elbow, wrist, hip, knee, ankle stable, and with full range of motion.  Impression and Recommendations:    The patient was counselled, risk factors were discussed, anticipatory guidance given.  Posttraumatic osteoarthritis of knee Joshua Gaines returns, Joshua Gaines is a very pleasant 40 year old male, Joshua Gaines has had decades of knee problems, predominantly osteoarthritis, intra-articular loose bodies, meniscal  injuries. Joshua Gaines has had a couple of arthroscopies with Dr. Griffin Basil and from a mechanical standpoint the knee is doing great, unfortunately Joshua Gaines does have several areas of cartilage loss, that cannot be managed any further with arthroscopy, Joshua Gaines is now a candidate for viscosupplementation. Joshua Gaines has x-ray confirmed osteoarthritis, we will go ahead and proceed on getting him approved for Visco for both knees.  Annual physical exam Unremarkable annual physical. Adding some routine screening labs, Joshua Gaines does have a family history of colon cancer, with an uncle who was diagnosed in his mid 40s, also had a grandfather who passed from colon cancer but in his 87s. We did discuss that the uncle was not technically a first-degree relative, but I would like him to touch base with gastroenterology for further discussion to see if we can get him the colonoscopy.   ___________________________________________ Gwen Her. Dianah Field, M.D., ABFM., CAQSM. Primary Care and Sports Medicine [facility]  Adjunct Professor of [place] [employer]

## 2021-04-07 NOTE — Assessment & Plan Note (Signed)
Joshua Gaines returns, he is a very pleasant 40 year old male, he has had decades of knee problems, predominantly osteoarthritis, intra-articular loose bodies, meniscal injuries. He has had a couple of arthroscopies with Dr. Griffin Basil and from a mechanical standpoint the knee is doing great, unfortunately he does have several areas of cartilage loss, that cannot be managed any further with arthroscopy, he is now a candidate for viscosupplementation. He has x-ray confirmed osteoarthritis, we will go ahead and proceed on getting him approved for Visco for both knees.

## 2021-04-07 NOTE — Telephone Encounter (Signed)
Please work on viscosupplementation approval for gel we, he has bilateral x-ray, MRI, and arthroscopy proven osteoarthritis with areas of cartilage loss, he has had multiple steroid injections, he has had physical therapy for greater than 6 weeks, activity modification, bracing.

## 2021-04-07 NOTE — Assessment & Plan Note (Signed)
Unremarkable annual physical. Adding some routine screening labs, he does have a family history of colon cancer, with an uncle who was diagnosed in his mid 51s, also had a grandfather who passed from colon cancer but in his 43s. We did discuss that the uncle was not technically a first-degree relative, but I would like him to touch base with gastroenterology for further discussion to see if we can get him the colonoscopy.

## 2021-04-08 DIAGNOSIS — Z0000 Encounter for general adult medical examination without abnormal findings: Secondary | ICD-10-CM | POA: Diagnosis not present

## 2021-04-09 LAB — LIPID PANEL
Cholesterol: 152 mg/dL (ref <200)
HDL: 44 mg/dL (ref >=40)
LDL Cholesterol (Calc): 91 mg/dL (calc)
Non-HDL Cholesterol (Calc): 108 mg/dL (calc) (ref <130)
Total CHOL/HDL Ratio: 3.5 (calc) (ref <5.0)
Triglycerides: 84 mg/dL (ref <150)

## 2021-04-09 LAB — COMPREHENSIVE METABOLIC PANEL
AG Ratio: 2 (calc) (ref 1.0–2.5)
ALT: 21 U/L (ref 9–46)
AST: 16 U/L (ref 10–40)
Albumin: 4.5 g/dL (ref 3.6–5.1)
Alkaline phosphatase (APISO): 60 U/L (ref 36–130)
BUN: 18 mg/dL (ref 7–25)
CO2: 28 mmol/L (ref 20–32)
Calcium: 9.5 mg/dL (ref 8.6–10.3)
Chloride: 107 mmol/L (ref 98–110)
Creat: 1.03 mg/dL (ref 0.60–1.29)
Globulin: 2.2 g/dL (calc) (ref 1.9–3.7)
Glucose, Bld: 81 mg/dL (ref 65–99)
Potassium: 4.3 mmol/L (ref 3.5–5.3)
Sodium: 144 mmol/L (ref 135–146)
Total Bilirubin: 0.8 mg/dL (ref 0.2–1.2)
Total Protein: 6.7 g/dL (ref 6.1–8.1)

## 2021-04-09 LAB — CBC
HCT: 47.2 % (ref 38.5–50.0)
Hemoglobin: 16.1 g/dL (ref 13.2–17.1)
MCH: 32.5 pg (ref 27.0–33.0)
MCHC: 34.1 g/dL (ref 32.0–36.0)
MCV: 95.4 fL (ref 80.0–100.0)
MPV: 10.8 fL (ref 7.5–12.5)
Platelets: 147 10*3/uL (ref 140–400)
RBC: 4.95 10*6/uL (ref 4.20–5.80)
RDW: 12.2 % (ref 11.0–15.0)
WBC: 4.9 10*3/uL (ref 3.8–10.8)

## 2021-04-09 LAB — TSH: TSH: 2.38 mIU/L (ref 0.40–4.50)

## 2021-04-10 ENCOUNTER — Other Ambulatory Visit (HOSPITAL_BASED_OUTPATIENT_CLINIC_OR_DEPARTMENT_OTHER): Payer: Self-pay

## 2021-04-10 ENCOUNTER — Telehealth: Payer: Self-pay

## 2021-04-10 DIAGNOSIS — Z20822 Contact with and (suspected) exposure to covid-19: Secondary | ICD-10-CM | POA: Insufficient documentation

## 2021-04-10 MED ORDER — NIRMATRELVIR/RITONAVIR (PAXLOVID)TABLET
3.0000 | ORAL_TABLET | Freq: Two times a day (BID) | ORAL | 0 refills | Status: DC
  Filled 2021-04-10: qty 30, 5d supply, fill #0

## 2021-04-10 NOTE — Telephone Encounter (Signed)
Joshua Gaines called and states his son has the flu. He does not have symptoms at the moment. He is wanting tamiflu. Pended prescription.   CMP     Component Value Date/Time   NA 144 04/08/2021 0000   K 4.3 04/08/2021 0000   CL 107 04/08/2021 0000   CO2 28 04/08/2021 0000   GLUCOSE 81 04/08/2021 0000   BUN 18 04/08/2021 0000   CREATININE 1.03 04/08/2021 0000   CALCIUM 9.5 04/08/2021 0000   PROT 6.7 04/08/2021 0000   ALBUMIN 5.0 12/23/2016 1359   AST 16 04/08/2021 0000   ALT 21 04/08/2021 0000   ALKPHOS 58 12/23/2016 1359   BILITOT 0.8 04/08/2021 0000   GFRNONAA 81 01/08/2020 1049   GFRAA 94 01/08/2020 1049

## 2021-04-10 NOTE — Assessment & Plan Note (Signed)
Son has COVID, adding Paxlovid for [name].

## 2021-04-10 NOTE — Telephone Encounter (Signed)
Update from [place], he said his son actually has COVID, not the flu, adding Paxlovid.

## 2021-04-15 NOTE — Telephone Encounter (Signed)
Benefits Investigation Details received from [employer] Injection: Monovisc  Medical: Deductible applies. Since the deductible has been met, patient is responsible for a coinsurance. Once the OOP has been met, patient is covered at 100%. PA is required for the drug through Care Management.  OV notes for PA faxed: Care Management at [phone].  Fax confirmation received  Pharmacy: Benefits are currently disclosed until a PA has been obtained.   Specialty Pharmacy: [facility] for medical, [facility] for pharmacy   May fill through: Buy and Bill OV Copay/Coinsurance: 20% (medical) Product Copay: 20% (medical) Administration Coinsurance: 20% (medical) Administration Copay: None Deductible: $1500 (met: $1500) Out of Pocket Max: $4000 (met: [id number])

## 2021-05-14 ENCOUNTER — Other Ambulatory Visit: Payer: Self-pay

## 2021-05-14 ENCOUNTER — Encounter: Payer: Self-pay | Admitting: Gastroenterology

## 2021-05-14 ENCOUNTER — Ambulatory Visit (INDEPENDENT_AMBULATORY_CARE_PROVIDER_SITE_OTHER): Payer: 59 | Admitting: Gastroenterology

## 2021-05-14 VITALS — BP 112/78 | HR 65 | Ht 73.0 in | Wt 189.2 lb

## 2021-05-14 DIAGNOSIS — Z800 Family history of malignant neoplasm of digestive organs: Secondary | ICD-10-CM | POA: Diagnosis not present

## 2021-05-14 DIAGNOSIS — K219 Gastro-esophageal reflux disease without esophagitis: Secondary | ICD-10-CM | POA: Diagnosis not present

## 2021-05-14 NOTE — Patient Instructions (Addendum)
If you are age 43 or older, your body mass index should be between 23-30. Your There is no height or weight on file to calculate BMI. If this is out of the aforementioned range listed, please consider follow up with your Primary Care Provider.  If you are age 63 or younger, your body mass index should be between 19-25. Your There is no height or weight on file to calculate BMI. If this is out of the aformentioned range listed, please consider follow up with your Primary Care Provider.   __________________________________________________________  The [facility] providers would like to encourage you to use [facility] to communicate with providers for non-urgent requests or questions.  Due to long hold times on the telephone, sending your provider a message by [facility] may be a faster and more efficient way to get a response.  Please allow 48 business hours for a response.  Please remember that this is for non-urgent requests.   You have been scheduled for a colonoscopy. Please follow written instructions given to you at your visit today.  Please pick up your prep supplies at the pharmacy within the next 1-3 days. If you use inhalers (even only as needed), please bring them with you on the day of your procedure.  We have given you samples of the following medication to take: Clenpiq  Thank you for choosing me and [facility].  Vito Cirigliano, D.O.

## 2021-05-14 NOTE — Progress Notes (Signed)
Chief Complaint: Family history of colon cancer   Referring Provider:     Aundria Mems J,MD    HPI:     Joshua Gaines is a 41 y.o. male referred to the Gastroenterology Clinic for evaluation of early colon cancer screening due to family history of colon cancer.  He is otherwise without any GI symptoms.  Family history notable for maternal grandfather and maternal uncle (diagnosed in his 87's) with colon cancer.  He does not know anything about his mother's medical history though.  No previous colonoscopy.  Hx of GERD with EGD in 2019 n/f esophagitis per patient. Reflux well controlled with omeprazole and he has since titrated off without return of sxs.  Reflux now controlled with dietary mods alone.    CBC Latest Ref Rng & Units 04/08/2021 01/08/2020 12/28/2017  WBC 3.8 - 10.8 Thousand/uL 4.9 4.6 4.4  Hemoglobin 13.2 - 17.1 g/dL 16.1 16.3 15.9  Hematocrit 38.5 - 50.0 % 47.2 46.8 45.9  Platelets 140 - 400 Thousand/uL 147 153 143   CMP Latest Ref Rng & Units 04/08/2021 01/08/2020 12/28/2017  Glucose 65 - 99 mg/dL 81 88 83  BUN 7 - 25 mg/dL 18 15 16   Creatinine 0.60 - 1.29 mg/dL 1.03 1.13 1.11  Sodium 135 - 146 mmol/L 144 141 141  Potassium 3.5 - 5.3 mmol/L 4.3 4.2 4.3  Chloride 98 - 110 mmol/L 107 104 104  CO2 20 - 32 mmol/L 28 31 28   Calcium 8.6 - 10.3 mg/dL 9.5 9.9 9.8  Total Protein 6.1 - 8.1 g/dL 6.7 6.8 7.2  Total Bilirubin 0.2 - 1.2 mg/dL 0.8 0.9 1.2  Alkaline Phos 40 - 115 U/L - - -  AST 10 - 40 U/L 16 20 21   ALT 9 - 46 U/L 21 15 21       Past Medical History:  Diagnosis Date   GERD (gastroesophageal reflux disease)    PONV (postoperative nausea and vomiting)      Past Surgical History:  Procedure Laterality Date   ANTERIOR CRUCIATE LIGAMENT REPAIR  [id number]   CHONDROPLASTY Right 02/01/2018   Procedure: CHONDROPLASTY;  Surgeon: Hiram Gash, MD;  Location: [place];  Service: Orthopedics;  Laterality: Right;    CHONDROPLASTY Right 02/07/2020   Procedure: CHONDROPLASTY;  Surgeon: Hiram Gash, MD;  Location: [place];  Service: Orthopedics;  Laterality: Right;   ESOPHAGOGASTRODUODENOSCOPY     2019-2020 at [facility] GI Dr Watt Climes was put on a PPI   HARDWARE REMOVAL Right 02/01/2018   Procedure: HARDWARE REMOVAL;  Surgeon: Hiram Gash, MD;  Location: [place];  Service: Orthopedics;  Laterality: Right;   HARDWARE REMOVAL Left 06/11/2019   Procedure: HARDWARE REMOVAL;  Surgeon: Hiram Gash, MD;  Location: [place];  Service: Orthopedics;  Laterality: Left;   HERNIA REPAIR  aprox 2010 or 2013   right inguinal    KNEE ARTHROSCOPY Right 03/20/2020   Procedure: ARTHROSCOPY KNEE LOOSE BODY EXCISION;  Surgeon: Hiram Gash, MD;  Location: [place];  Service: Orthopedics;  Laterality: Right;   KNEE ARTHROSCOPY WITH ANTERIOR CRUCIATE LIGAMENT (ACL) REPAIR Left 06/11/2019   Procedure: KNEE ARTHROSCOPY WITH ANTERIOR CRUCIATE LIGAMENT (ACL) REPAIR WITH HAMSTRING AUTOGRAFT;  Surgeon: Hiram Gash, MD;  Location: [place];  Service: Orthopedics;  Laterality: Left;   KNEE ARTHROSCOPY WITH ANTERIOR CRUCIATE LIGAMENT (ACL) REPAIR WITH HAMSTRING GRAFT Right  02/01/2018   Procedure: RIGHT KNEE ARTHROSCOPY WITH POSSIBLE REVISION ANTERIOR CRUCIATE LIGAMENT (ACL) REPAIR WITH AUTOGRAFT HAMSTRING;  Surgeon: Hiram Gash, MD;  Location: [place];  Service: Orthopedics;  Laterality: Right;   KNEE ARTHROSCOPY WITH LATERAL MENISECTOMY Right 02/01/2018   Procedure: RIGHT KNEE ARTHROSCOPY WITH LATERAL MENISECTOMY;  Surgeon: Hiram Gash, MD;  Location: [place];  Service: Orthopedics;  Laterality: Right;   KNEE ARTHROSCOPY WITH LATERAL MENISECTOMY Right 02/07/2020   Procedure: ARTHROSCOPY KNEE REMOVAL OF LOOSE FOREIGN BODY, PARTIAL LATERAL MENISECTOMY AND DEBRIDEMENT/SHAVING CHONDROPLASTY;  Surgeon: Hiram Gash, MD;   Location: [place];  Service: Orthopedics;  Laterality: Right;   KNEE ARTHROSCOPY WITH MEDIAL MENISECTOMY Right 02/01/2018   Procedure: RIGHT KNEE ARTHROSCOPY WITH MEDIAL MENISECTOMY, CHONDROPLASTY;  Surgeon: Hiram Gash, MD;  Location: [place];  Service: Orthopedics;  Laterality: Right;   MENISCUS REPAIR     left knee   TENDON REPAIR Right 05/01/2020   Procedure: EXTENSOR HALLUCIS LONGUS AND BREVIS TENDON REPAIR;  Surgeon: Erle Crocker, MD;  Location: [place];  Service: Orthopedics;  Laterality: Right;   WOUND EXPLORATION Right 05/01/2020   Procedure: WOUND EXPLORATION OF RIGHT FOOT;  Surgeon: Erle Crocker, MD;  Location: [place];  Service: Orthopedics;  Laterality: Right;   No family history on file. Social History   Tobacco Use   Smoking status: Never   Smokeless tobacco: Never  Vaping Use   Vaping Use: Never used  Substance Use Topics   Alcohol use: No   Drug use: No   No current outpatient medications on file.   No current facility-administered medications for this visit.   No Known Allergies   Review of Systems: All systems reviewed and negative except where noted in HPI.     Physical Exam:    Wt Readings from Last 3 Encounters:  05/14/21 189 lb 4 oz (85.8 kg)  04/07/21 188 lb 1.9 oz (85.3 kg)  05/01/20 186 lb 1.1 oz (84.4 kg)    Ht 6\' 1"  (1.854 m)    Wt 189 lb 4 oz (85.8 kg)    BMI 24.97 kg/m  Constitutional:  Pleasant, in no acute distress. Psychiatric: Normal mood and affect. Behavior is normal. EENT: Pupils normal.   Neurological: Alert and oriented to person place and time. Skin: Skin is warm and dry. No rashes noted.   ASSESSMENT AND PLAN;   1) Family History of Colon Cancer 41 year old male with multiple second-degree relatives with colon cancer, including his maternal uncle diagnosed in his 37s.  He does not know anything about his mother's medical history.  We  discussed colon cancer screening guidelines at length today.  Based on the multiple relatives across 2 generations with colon cancer, and not knowing anything about his mother's history, very reasonable to approach as elevated risk, early screening for possible familial syndrome. - Schedule colonoscopy  2) GERD - Well-controlled with dietary modifications alone  The indications, risks, and benefits of colonoscopy were explained to the patient in detail. Risks include but are not limited to bleeding, perforation, adverse reaction to medications, and cardiopulmonary compromise. Sequelae include but are not limited to the possibility of surgery, hospitalization, and mortality. The patient verbalized understanding and wished to proceed. All questions answered, referred to the scheduler and bowel prep ordered. Further recommendations pending results of the exam.     Lavena Bullion, DO, FACG  05/14/2021, 9:31 AM   Silverio Decamp,*

## 2021-05-19 ENCOUNTER — Other Ambulatory Visit: Payer: Self-pay

## 2021-05-19 ENCOUNTER — Ambulatory Visit (AMBULATORY_SURGERY_CENTER): Payer: 59 | Admitting: Gastroenterology

## 2021-05-19 ENCOUNTER — Encounter: Payer: Self-pay | Admitting: Gastroenterology

## 2021-05-19 VITALS — BP 99/58 | HR 58 | Temp 98.0°F | Resp 14 | Ht 73.0 in | Wt 189.0 lb

## 2021-05-19 DIAGNOSIS — Z1211 Encounter for screening for malignant neoplasm of colon: Secondary | ICD-10-CM

## 2021-05-19 DIAGNOSIS — D124 Benign neoplasm of descending colon: Secondary | ICD-10-CM | POA: Diagnosis not present

## 2021-05-19 DIAGNOSIS — K621 Rectal polyp: Secondary | ICD-10-CM | POA: Diagnosis not present

## 2021-05-19 DIAGNOSIS — D128 Benign neoplasm of rectum: Secondary | ICD-10-CM | POA: Diagnosis not present

## 2021-05-19 DIAGNOSIS — K635 Polyp of colon: Secondary | ICD-10-CM | POA: Diagnosis not present

## 2021-05-19 DIAGNOSIS — Z800 Family history of malignant neoplasm of digestive organs: Secondary | ICD-10-CM

## 2021-05-19 MED ORDER — SODIUM CHLORIDE 0.9 % IV SOLN: 500.0000 mL | Freq: Once | INTRAVENOUS | Status: DC

## 2021-05-19 NOTE — Progress Notes (Signed)
Pt's states no medical or surgical changes since previsit or office visit. VS assessed by C.W 

## 2021-05-19 NOTE — Op Note (Signed)
[facility] Patient Name: Joshua Gaines Procedure Date: 05/19/2021 3:03 PM MRN: [id number] Endoscopist: Gerrit Heck , MD Age: 41 Referring MD:  Date of Birth: [birth year] Gender: Male Account #: [id number] Procedure:                Colonoscopy Indications:              Colon cancer screening in patient at increased                            risk: Family history of colorectal cancer in                            multiple 2nd degree relatives                           Family history notable for maternal grandfather and                            maternal uncle (diagnosed in his 28's) with colon                            cancer. He is otherwise without any GI symptoms. Medicines:                Monitored Anesthesia Care Procedure:                Pre-Anesthesia Assessment:                           - Prior to the procedure, a History and Physical                            was performed, and patient medications and                            allergies were reviewed. The patient's tolerance of                            previous anesthesia was also reviewed. The risks                            and benefits of the procedure and the sedation                            options and risks were discussed with the patient.                            All questions were answered, and informed consent                            was obtained. Prior Anticoagulants: The patient has                            taken no previous anticoagulant or antiplatelet  agents. ASA Grade Assessment: II - A patient with                            mild systemic disease. After reviewing the risks                            and benefits, the patient was deemed in                            satisfactory condition to undergo the procedure.                           After obtaining informed consent, the colonoscope                            was passed under direct vision.  Throughout the                            procedure, the patient's blood pressure, pulse, and                            oxygen saturations were monitored continuously. The                            CF [id number] #[id number] was introduced through the anus                            and advanced to the the cecum, identified by                            appendiceal orifice and ileocecal valve. The                            colonoscopy was performed without difficulty. The                            patient tolerated the procedure well. The quality                            of the bowel preparation was good. The terminal                            ileum, ileocecal valve, appendiceal orifice, and                            rectum were photographed. Scope In: 3:07:53 PM Scope Out: 3:25:39 PM Scope Withdrawal Time: 0 hours 12 minutes 45 seconds  Total Procedure Duration: 0 hours 17 minutes 46 seconds  Findings:                 The perianal and digital rectal examinations were                            normal.  Two sessile polyps were found in the descending                            colon. The polyps were 2 to 3 mm in size. These                            polyps were removed with a cold snare. Resection                            and retrieval were complete. Estimated blood loss                            was minimal.                           A 3 mm polyp was found in the rectum. The polyp was                            sessile. The polyp was removed with a cold snare.                            Resection and retrieval were complete. Estimated                            blood loss was minimal.                           The retroflexed view of the distal rectum and anal                            verge was normal and showed no anal or rectal                            abnormalities.                           The terminal ileum appeared normal. Complications:             No immediate complications. Estimated Blood Loss:     Estimated blood loss was minimal. Impression:               - Two 2 to 3 mm polyps in the descending colon,                            removed with a cold snare. Resected and retrieved.                           - One 3 mm polyp in the rectum, removed with a cold                            snare. Resected and retrieved.                           - The distal rectum  and anal verge are normal on                            retroflexion view.                           - The examined portion of the ileum was normal. Recommendation:           - Patient has a contact number available for                            emergencies. The signs and symptoms of potential                            delayed complications were discussed with the                            patient. Return to normal activities tomorrow.                            Written discharge instructions were provided to the                            patient.                           - Resume previous diet.                           - Continue present medications.                           - Await pathology results.                           - Repeat colonoscopy for surveillance based on                            pathology results.                           - Return to GI office PRN. Gerrit Heck, MD 05/19/2021 3:30:19 PM

## 2021-05-19 NOTE — Progress Notes (Signed)
A and O x3. Report to RN. Tolerated MAC anesthesia well.

## 2021-05-19 NOTE — Progress Notes (Signed)
Called to room to assist during endoscopic procedure.  Patient ID and intended procedure confirmed with present staff. Received instructions for my participation in the procedure from the performing physician.  

## 2021-05-19 NOTE — Progress Notes (Signed)
GASTROENTEROLOGY PROCEDURE H&P NOTE   Primary Care Physician: Silverio Decamp, MD    Reason for Procedure:   Family history of colon cancer  Plan:    Colonoscopy  Patient is appropriate for endoscopic procedure(s) in the ambulatory ([facility]) setting.  The nature of the procedure, as well as the risks, benefits, and alternatives were carefully and thoroughly reviewed with the patient. Ample time for discussion and questions allowed. The patient understood, was satisfied, and agreed to proceed.     HPI: Joshua Gaines is a 41 y.o. male who presents for colonoscopy for evaluation of family history of colon cancer.  Patient was most recently seen in the Gastroenterology Clinic on 05/14/2021.  No interval change in medical history since that appointment. Please refer to that note for full details regarding GI history and clinical presentation.   Past Medical History:  Diagnosis Date   Anxiety    GERD (gastroesophageal reflux disease)    PONV (postoperative nausea and vomiting)     Past Surgical History:  Procedure Laterality Date   ANTERIOR CRUCIATE LIGAMENT REPAIR  [id number]   CHONDROPLASTY Right 02/01/2018   Procedure: CHONDROPLASTY;  Surgeon: Hiram Gash, MD;  Location: [place];  Service: Orthopedics;  Laterality: Right;   CHONDROPLASTY Right 02/07/2020   Procedure: CHONDROPLASTY;  Surgeon: Hiram Gash, MD;  Location: [place];  Service: Orthopedics;  Laterality: Right;   ESOPHAGOGASTRODUODENOSCOPY     2019-2020 at [facility] GI Dr Watt Climes was put on a PPI   HARDWARE REMOVAL Right 02/01/2018   Procedure: HARDWARE REMOVAL;  Surgeon: Hiram Gash, MD;  Location: [place];  Service: Orthopedics;  Laterality: Right;   HARDWARE REMOVAL Left 06/11/2019   Procedure: HARDWARE REMOVAL;  Surgeon: Hiram Gash, MD;  Location: [place];  Service: Orthopedics;  Laterality: Left;   HERNIA REPAIR  aprox 2010 or 2013    right inguinal    KNEE ARTHROSCOPY Right 03/20/2020   Procedure: ARTHROSCOPY KNEE LOOSE BODY EXCISION;  Surgeon: Hiram Gash, MD;  Location: [place];  Service: Orthopedics;  Laterality: Right;   KNEE ARTHROSCOPY WITH ANTERIOR CRUCIATE LIGAMENT (ACL) REPAIR Left 06/11/2019   Procedure: KNEE ARTHROSCOPY WITH ANTERIOR CRUCIATE LIGAMENT (ACL) REPAIR WITH HAMSTRING AUTOGRAFT;  Surgeon: Hiram Gash, MD;  Location: [place];  Service: Orthopedics;  Laterality: Left;   KNEE ARTHROSCOPY WITH ANTERIOR CRUCIATE LIGAMENT (ACL) REPAIR WITH HAMSTRING GRAFT Right 02/01/2018   Procedure: RIGHT KNEE ARTHROSCOPY WITH POSSIBLE REVISION ANTERIOR CRUCIATE LIGAMENT (ACL) REPAIR WITH AUTOGRAFT HAMSTRING;  Surgeon: Hiram Gash, MD;  Location: [place];  Service: Orthopedics;  Laterality: Right;   KNEE ARTHROSCOPY WITH LATERAL MENISECTOMY Right 02/01/2018   Procedure: RIGHT KNEE ARTHROSCOPY WITH LATERAL MENISECTOMY;  Surgeon: Hiram Gash, MD;  Location: [place];  Service: Orthopedics;  Laterality: Right;   KNEE ARTHROSCOPY WITH LATERAL MENISECTOMY Right 02/07/2020   Procedure: ARTHROSCOPY KNEE REMOVAL OF LOOSE FOREIGN BODY, PARTIAL LATERAL MENISECTOMY AND DEBRIDEMENT/SHAVING CHONDROPLASTY;  Surgeon: Hiram Gash, MD;  Location: [place];  Service: Orthopedics;  Laterality: Right;   KNEE ARTHROSCOPY WITH MEDIAL MENISECTOMY Right 02/01/2018   Procedure: RIGHT KNEE ARTHROSCOPY WITH MEDIAL MENISECTOMY, CHONDROPLASTY;  Surgeon: Hiram Gash, MD;  Location: [place];  Service: Orthopedics;  Laterality: Right;   MENISCUS REPAIR     left knee   TENDON REPAIR Right 05/01/2020   Procedure: EXTENSOR HALLUCIS LONGUS AND BREVIS  TENDON REPAIR;  Surgeon: Erle Crocker, MD;  Location: [place];  Service: Orthopedics;  Laterality: Right;   WOUND EXPLORATION Right 05/01/2020   Procedure: WOUND EXPLORATION  OF RIGHT FOOT;  Surgeon: Erle Crocker, MD;  Location: [place];  Service: Orthopedics;  Laterality: Right;    Prior to Admission medications   Medication Sig Start Date End Date Taking? Authorizing Provider  omeprazole (PRILOSEC) 20 MG capsule Take 1 capsule (20 mg total) by mouth daily. 30 days for gastroprotection while taking NSAIDs. 03/20/20 04/28/20  Ethelda Chick, PA-C    No current outpatient medications on file.   Current Facility-Administered Medications  Medication Dose Route Frequency Provider Last Rate Last Admin   0.9 %  sodium chloride infusion  500 mL Intravenous Once Sakara Lehtinen V, DO        Allergies as of 05/19/2021   (No Known Allergies)    Family History  Problem Relation Age of Onset   Colon cancer Maternal Uncle    Colon cancer Maternal Grandfather    Esophageal cancer Neg Hx    Rectal cancer Neg Hx    Stomach cancer Neg Hx     Social History   Socioeconomic History   Marital status: Married    Spouse name: Not on file   Number of children: Not on file   Years of education: Not on file   Highest education level: Not on file  Occupational History   Not on file  Tobacco Use   Smoking status: Never   Smokeless tobacco: Never  Vaping Use   Vaping Use: Never used  Substance and Sexual Activity   Alcohol use: No   Drug use: No   Sexual activity: Yes    Partners: Female  Other Topics Concern   Not on file  Social History Narrative   Not on file   Social Determinants of Health   Financial Resource Strain: Not on file  Food Insecurity: Not on file  Transportation Needs: Not on file  Physical Activity: Not on file  Stress: Not on file  Social Connections: Not on file  Intimate Partner Violence: Not on file    Physical Exam: Vital signs in last 24 hours: @BP  (!) 118/57    Pulse 63    Temp 98 F (36.7 C) (Skin)    Ht 6\' 1"  (1.854 m)    Wt 189 lb (85.7 kg)    SpO2 99%    BMI 24.94 kg/m  GEN: NAD EYE:  Sclerae anicteric ENT: MMM CV: Non-tachycardic Pulm: CTA b/l GI: Soft, NT/ND NEURO:  Alert & Oriented x 3   Gerrit Heck, DO [facility]   05/19/2021 3:01 PM

## 2021-05-19 NOTE — Patient Instructions (Signed)
Handout on polyps provided.  Await pathology results .   Return to GI office as needed   YOU HAD AN ENDOSCOPIC PROCEDURE TODAY AT [facility]:   Refer to the procedure report that was given to you for any specific questions about what was found during the examination.  If the procedure report does not answer your questions, please call your gastroenterologist to clarify.  If you requested that your care partner not be given the details of your procedure findings, then the procedure report has been included in a sealed envelope for you to review at your convenience later.  YOU SHOULD EXPECT: Some feelings of bloating in the abdomen. Passage of more gas than usual.  Walking can help get rid of the air that was put into your GI tract during the procedure and reduce the bloating. If you had a lower endoscopy (such as a colonoscopy or flexible sigmoidoscopy) you may notice spotting of blood in your stool or on the toilet paper. If you underwent a bowel prep for your procedure, you may not have a normal bowel movement for a few days.  Please Note:  You might notice some irritation and congestion in your nose or some drainage.  This is from the oxygen used during your procedure.  There is no need for concern and it should clear up in a day or so.  SYMPTOMS TO REPORT IMMEDIATELY:  Following lower endoscopy (colonoscopy or flexible sigmoidoscopy):  Excessive amounts of blood in the stool  Significant tenderness or worsening of abdominal pains  Swelling of the abdomen that is new, acute  Fever of 100F or higher  For urgent or emergent issues, a gastroenterologist can be reached at any hour by calling [phone]. Do not use MyChart messaging for urgent concerns.    DIET:  We do recommend a small meal at first, but then you may proceed to your regular diet.  Drink plenty of fluids but you should avoid alcoholic beverages for 24 hours.  ACTIVITY:  You should plan to take it easy  for the rest of today and you should NOT DRIVE or use heavy machinery until tomorrow (because of the sedation medicines used during the test).    FOLLOW UP: Our staff will call the number listed on your records 48-72 hours following your procedure to check on you and address any questions or concerns that you may have regarding the information given to you following your procedure. If we do not reach you, we will leave a message.  We will attempt to reach you two times.  During this call, we will ask if you have developed any symptoms of COVID 19. If you develop any symptoms (ie: fever, flu-like symptoms, shortness of breath, cough etc.) before then, please call [phone].  If you test positive for Covid 19 in the 2 weeks post procedure, please call and report this information to [place].    If any biopsies were taken you will be contacted by phone or by letter within the next 1-3 weeks.  Please call us at [phone] if you have not heard about the biopsies in 3 weeks.    SIGNATURES/CONFIDENTIALITY: You and/or your care partner have signed paperwork which will be entered into your electronic medical record.  These signatures attest to the fact that that the information above on your After Visit Summary has been reviewed and is understood.  Full responsibility of the confidentiality of this discharge information lies with you and/or your care-partner.

## 2021-05-21 ENCOUNTER — Telehealth: Payer: Self-pay

## 2021-05-21 NOTE — Telephone Encounter (Signed)
°  Follow up Call-  Call back number 05/19/2021  Post procedure Call Back phone  # [phone]  Permission to leave phone message Yes  Some recent data might be hidden     Patient questions:  Do you have a fever, pain , or abdominal swelling? No. Pain Score  0 *  Have you tolerated food without any problems? yes  Have you been able to return to your normal activities? yes  Do you have any questions about your discharge instructions: Diet   No. Medications  No. Follow up visit  No.  Do you have questions or concerns about your Care? No.  Actions: * If pain score is 4 or above: No action needed, pain <4.

## 2021-05-21 NOTE — Telephone Encounter (Signed)
°  Follow up Call-  Call back number 05/19/2021  Post procedure Call Back phone  # [phone]  Permission to leave phone message Yes  Some recent data might be hidden    F/u call attempted, no answer, left voicemail.

## 2021-05-26 ENCOUNTER — Encounter: Payer: Self-pay | Admitting: Gastroenterology

## 2021-06-04 NOTE — Telephone Encounter (Signed)
As it is the beginning of the year, a new benefits investigation was submitted.

## 2021-06-10 NOTE — Telephone Encounter (Addendum)
Benefits Investigation Details received from [employer]: [place]: Once the deductible has been met, patient is responible for a coinsurance. Once the OOP has been met, patient is covered at 100%. Prior Authorization for the drug is currently under review.  PA required: Yes  Spoke with [employer] to initiate a PA on Monovisc. Spokesperson took all the info and stated there would be a reply with 24-72 hours.   Pharmacy: Due to third party restrictions with [employer], we are unable to obtain coverage information. Please contact the plan at [phone] directly to confirm.   Specialty Pharmacy: [employer]  May fill through: [facility] and [place] Copay/Coinsurance: 40% Product Copay: 40% Administration Coinsurance: 40% Administration Copay:  Deductible: $1500 (met: $328.60) Out of Pocket Max: $4000 (met: $328.60)

## 2021-06-12 NOTE — Telephone Encounter (Signed)
Fax received from [place] requesting more information. Form completed and returned; confirmation obtained.

## 2021-06-17 NOTE — Telephone Encounter (Signed)
PA determination received from [employer] PA has been denied for Monovisc  "When used for the treatment of osteoarthritis of both knees, our guideline named hyaluronate requires that you have had a trial of or contraindication to Synvisc or Synvisc-One."   New PA submitted via telephone to [employer] for Synvisc. Up to a 72 hour turn around time for PA responses.

## 2021-06-22 NOTE — Telephone Encounter (Signed)
PA determination received from [employer] good from 06/19/21 through 12/16/21 PA ref # [id number] PA has been approved for Synvisc Pt aware of approval and reports that his knees are doing better right now and he wants to hold off on getting the injections. Also, the out of pocket cost is more than expected.

## 2021-10-20 ENCOUNTER — Other Ambulatory Visit (HOSPITAL_BASED_OUTPATIENT_CLINIC_OR_DEPARTMENT_OTHER): Payer: Self-pay

## 2021-10-20 MED ORDER — PREVIDENT 5000 BOOSTER PLUS 1.1 % DT PSTE
PASTE | DENTAL | 6 refills | Status: AC
  Filled 2021-10-20: qty 100, 30d supply, fill #0

## 2022-01-26 ENCOUNTER — Ambulatory Visit: Payer: 59 | Admitting: Sports Medicine

## 2022-01-26 ENCOUNTER — Ambulatory Visit (INDEPENDENT_AMBULATORY_CARE_PROVIDER_SITE_OTHER): Payer: 59

## 2022-01-26 DIAGNOSIS — S6992XA Unspecified injury of left wrist, hand and finger(s), initial encounter: Secondary | ICD-10-CM

## 2022-01-26 DIAGNOSIS — M25532 Pain in left wrist: Secondary | ICD-10-CM | POA: Diagnosis not present

## 2022-01-26 NOTE — Assessment & Plan Note (Signed)
Joshua Gaines returns, he is a very pleasant 41 year old male, accidentally fell down the stairs and fell onto an outstretched left arm about 2 months ago. Unfortunately continues to have significant pain that he localizes dorsal radiocarpal with terminal wrist extension. Exam is for the most part unrevealing. We went over the anatomy and the likelihood of a ligamentous injury, he has really not been treated so far so we will start conservatively, he can do NSAIDs over-the-counter and I would also like him to do a Velcro wrist brace and x-rays, will do the brace for a month and if persistent discomfort we will proceed with MR arthrography.

## 2022-01-26 NOTE — Progress Notes (Signed)
    Procedures performed today:    None.  Independent interpretation of notes and tests performed by another provider:   None.  Brief History, Exam, Impression, and Recommendations:    Left wrist injury Joshua Gaines returns, he is a very pleasant 41 year old male, accidentally fell down the stairs and fell onto an outstretched left arm about 2 months ago. Unfortunately continues to have significant pain that he localizes dorsal radiocarpal with terminal wrist extension. Exam is for the most part unrevealing. We went over the anatomy and the likelihood of a ligamentous injury, he has really not been treated so far so we will start conservatively, he can do NSAIDs over-the-counter and I would also like him to do a Velcro wrist brace and x-rays, will do the brace for a month and if persistent discomfort we will proceed with MR arthrography.    ____________________________________________ Gwen Her. Dianah Field, M.D., ABFM., CAQSM., AME. Primary Care and Sports Medicine [place] [facility]  Adjunct Professor of [place] [employer] [facility] of Medicine  Risk manager

## 2022-02-23 ENCOUNTER — Ambulatory Visit: Payer: 59 | Admitting: Family Medicine

## 2022-03-02 ENCOUNTER — Ambulatory Visit: Payer: 59 | Admitting: Sports Medicine

## 2022-03-02 VITALS — Wt 197.0 lb

## 2022-03-02 DIAGNOSIS — R739 Hyperglycemia, unspecified: Secondary | ICD-10-CM

## 2022-03-02 DIAGNOSIS — Z0000 Encounter for general adult medical examination without abnormal findings: Secondary | ICD-10-CM | POA: Diagnosis not present

## 2022-03-02 DIAGNOSIS — S6992XD Unspecified injury of left wrist, hand and finger(s), subsequent encounter: Secondary | ICD-10-CM | POA: Diagnosis not present

## 2022-03-02 NOTE — Progress Notes (Signed)
Subjective:    CC: Annual Physical Exam  HPI:  This patient is here for their annual physical  I reviewed the past medical history, family history, social history, surgical history, and allergies today and no changes were needed.  Please see the problem list section below in epic for further details.  Past Medical History: Past Medical History:  Diagnosis Date   Anxiety    GERD (gastroesophageal reflux disease)    PONV (postoperative nausea and vomiting)    Past Surgical History: Past Surgical History:  Procedure Laterality Date   ANTERIOR CRUCIATE LIGAMENT REPAIR  [id number]   CHONDROPLASTY Right 02/01/2018   Procedure: CHONDROPLASTY;  Surgeon: Hiram Gash, MD;  Location: [place];  Service: Orthopedics;  Laterality: Right;   CHONDROPLASTY Right 02/07/2020   Procedure: CHONDROPLASTY;  Surgeon: Hiram Gash, MD;  Location: [place];  Service: Orthopedics;  Laterality: Right;   ESOPHAGOGASTRODUODENOSCOPY     2019-2020 at [facility] GI Dr Watt Climes was put on a PPI   HARDWARE REMOVAL Right 02/01/2018   Procedure: HARDWARE REMOVAL;  Surgeon: Hiram Gash, MD;  Location: [place];  Service: Orthopedics;  Laterality: Right;   HARDWARE REMOVAL Left 06/11/2019   Procedure: HARDWARE REMOVAL;  Surgeon: Hiram Gash, MD;  Location: [place];  Service: Orthopedics;  Laterality: Left;   HERNIA REPAIR  aprox 2010 or 2013   right inguinal    KNEE ARTHROSCOPY Right 03/20/2020   Procedure: ARTHROSCOPY KNEE LOOSE BODY EXCISION;  Surgeon: Hiram Gash, MD;  Location: [place];  Service: Orthopedics;  Laterality: Right;   KNEE ARTHROSCOPY WITH ANTERIOR CRUCIATE LIGAMENT (ACL) REPAIR Left 06/11/2019   Procedure: KNEE ARTHROSCOPY WITH ANTERIOR CRUCIATE LIGAMENT (ACL) REPAIR WITH HAMSTRING AUTOGRAFT;  Surgeon: Hiram Gash, MD;  Location: [place];  Service: Orthopedics;  Laterality: Left;   KNEE  ARTHROSCOPY WITH ANTERIOR CRUCIATE LIGAMENT (ACL) REPAIR WITH HAMSTRING GRAFT Right 02/01/2018   Procedure: RIGHT KNEE ARTHROSCOPY WITH POSSIBLE REVISION ANTERIOR CRUCIATE LIGAMENT (ACL) REPAIR WITH AUTOGRAFT HAMSTRING;  Surgeon: Hiram Gash, MD;  Location: [place];  Service: Orthopedics;  Laterality: Right;   KNEE ARTHROSCOPY WITH LATERAL MENISECTOMY Right 02/01/2018   Procedure: RIGHT KNEE ARTHROSCOPY WITH LATERAL MENISECTOMY;  Surgeon: Hiram Gash, MD;  Location: [place];  Service: Orthopedics;  Laterality: Right;   KNEE ARTHROSCOPY WITH LATERAL MENISECTOMY Right 02/07/2020   Procedure: ARTHROSCOPY KNEE REMOVAL OF LOOSE FOREIGN BODY, PARTIAL LATERAL MENISECTOMY AND DEBRIDEMENT/SHAVING CHONDROPLASTY;  Surgeon: Hiram Gash, MD;  Location: [place];  Service: Orthopedics;  Laterality: Right;   KNEE ARTHROSCOPY WITH MEDIAL MENISECTOMY Right 02/01/2018   Procedure: RIGHT KNEE ARTHROSCOPY WITH MEDIAL MENISECTOMY, CHONDROPLASTY;  Surgeon: Hiram Gash, MD;  Location: [place];  Service: Orthopedics;  Laterality: Right;   MENISCUS REPAIR     left knee   TENDON REPAIR Right 05/01/2020   Procedure: EXTENSOR HALLUCIS LONGUS AND BREVIS TENDON REPAIR;  Surgeon: Erle Crocker, MD;  Location: [place];  Service: Orthopedics;  Laterality: Right;   WOUND EXPLORATION Right 05/01/2020   Procedure: WOUND EXPLORATION OF RIGHT FOOT;  Surgeon: Erle Crocker, MD;  Location: [place];  Service: Orthopedics;  Laterality: Right;   Social History: Social History   Socioeconomic History   Marital status: Married    Spouse name: Not on file   Number of children: Not on file   Years of  education: Not on file   Highest education level: Not on file  Occupational History   Not on file  Tobacco Use   Smoking status: Never   Smokeless tobacco: Never  Vaping Use   Vaping Use: Never used   Substance and Sexual Activity   Alcohol use: No   Drug use: No   Sexual activity: Yes    Partners: Female  Other Topics Concern   Not on file  Social History Narrative   Not on file   Social Determinants of Health   Financial Resource Strain: Not on file  Food Insecurity: Not on file  Transportation Needs: Not on file  Physical Activity: Not on file  Stress: Not on file  Social Connections: Not on file   Family History: Family History  Problem Relation Age of Onset   Colon cancer Maternal Uncle    Colon cancer Maternal Grandfather    Esophageal cancer Neg Hx    Rectal cancer Neg Hx    Stomach cancer Neg Hx    Allergies: No Known Allergies Medications: See med rec.  Review of Systems: No headache, visual changes, nausea, vomiting, diarrhea, constipation, dizziness, abdominal pain, skin rash, fevers, chills, night sweats, swollen lymph nodes, weight loss, chest pain, body aches, joint swelling, muscle aches, shortness of breath, mood changes, visual or auditory hallucinations.  Objective:    General: Well Developed, well nourished, and in no acute distress.  Neuro: Alert and oriented x3, extra-ocular muscles intact, sensation grossly intact. Cranial nerves II through XII are intact, motor, sensory, and coordinative functions are all intact. HEENT: Normocephalic, atraumatic, pupils equal round reactive to light, neck supple, no masses, no lymphadenopathy, thyroid nonpalpable. Oropharynx, nasopharynx, external ear canals are unremarkable. Skin: Warm and dry, no rashes noted.  Cardiac: Regular rate and rhythm, no murmurs rubs or gallops.  Respiratory: Clear to auscultation bilaterally. Not using accessory muscles, speaking in full sentences.  Abdominal: Soft, nontender, nondistended, positive bowel sounds, no masses, no organomegaly.  Musculoskeletal: Shoulder, elbow, wrist, hip, knee, ankle stable, and with full range of motion.  Impression and Recommendations:    The  patient was counselled, risk factors were discussed, anticipatory guidance given.  Annual physical exam Annual physical as above, ordering routine labs, up-to-date on screening tests.  Left wrist injury Joshua Gaines returns, he is about 6 weeks post fall onto an outstretched left arm approximately 3 months ago. Improved slightly, we do have a concern for ligamentous injury, x-rays were negative. As it is improving to some degree we will hold off on additional treatment until the beginning of next year, and if persistent discomfort we will proceed with MR arthrography.   ____________________________________________ Gwen Her. Dianah Field, M.D., ABFM., CAQSM., AME. Primary Care and Sports Medicine [place] [facility]  Adjunct Professor of [place] of [facility] of Medicine  Risk manager

## 2022-03-02 NOTE — Assessment & Plan Note (Signed)
Joshua Gaines returns, he is about 6 weeks post fall onto an outstretched left arm approximately 3 months ago. Improved slightly, we do have a concern for ligamentous injury, x-rays were negative. As it is improving to some degree we will hold off on additional treatment until the beginning of next year, and if persistent discomfort we will proceed with MR arthrography.

## 2022-03-02 NOTE — Assessment & Plan Note (Signed)
Annual physical as above, ordering routine labs, up-to-date on screening tests.

## 2022-03-30 DIAGNOSIS — R739 Hyperglycemia, unspecified: Secondary | ICD-10-CM | POA: Diagnosis not present

## 2022-03-31 LAB — COMPLETE METABOLIC PANEL WITH GFR
AG Ratio: 2 (calc) (ref 1.0–2.5)
ALT: 23 U/L (ref 9–46)
AST: 20 U/L (ref 10–40)
Albumin: 4.7 g/dL (ref 3.6–5.1)
Alkaline phosphatase (APISO): 64 U/L (ref 36–130)
BUN: 16 mg/dL (ref 7–25)
CO2: 32 mmol/L (ref 20–32)
Calcium: 9.9 mg/dL (ref 8.6–10.3)
Chloride: 105 mmol/L (ref 98–110)
Creat: 1.13 mg/dL (ref 0.60–1.29)
Globulin: 2.3 g/dL (calc) (ref 1.9–3.7)
Glucose, Bld: 86 mg/dL (ref 65–99)
Potassium: 4.2 mmol/L (ref 3.5–5.3)
Sodium: 144 mmol/L (ref 135–146)
Total Bilirubin: 0.8 mg/dL (ref 0.2–1.2)
Total Protein: 7 g/dL (ref 6.1–8.1)
eGFR: 83 mL/min/{1.73_m2} (ref >=60)

## 2022-03-31 LAB — CBC
HCT: 50 % (ref 38.5–50.0)
Hemoglobin: 17.2 g/dL — ABNORMAL HIGH (ref 13.2–17.1)
MCH: 32.5 pg (ref 27.0–33.0)
MCHC: 34.4 g/dL (ref 32.0–36.0)
MCV: 94.5 fL (ref 80.0–100.0)
MPV: 9.9 fL (ref 7.5–12.5)
Platelets: 158 10*3/uL (ref 140–400)
RBC: 5.29 10*6/uL (ref 4.20–5.80)
RDW: 12.2 % (ref 11.0–15.0)
WBC: 4.7 10*3/uL (ref 3.8–10.8)

## 2022-03-31 LAB — LIPID PANEL
Cholesterol: 173 mg/dL (ref <200)
HDL: 49 mg/dL (ref >=40)
LDL Cholesterol (Calc): 105 mg/dL (calc) — ABNORMAL HIGH
Non-HDL Cholesterol (Calc): 124 mg/dL (calc) (ref <130)
Total CHOL/HDL Ratio: 3.5 (calc) (ref <5.0)
Triglycerides: 95 mg/dL (ref <150)

## 2022-03-31 LAB — HEMOGLOBIN A1C
Hgb A1c MFr Bld: 5.1 % of total Hgb (ref <5.7)
Mean Plasma Glucose: 100 mg/dL
eAG (mmol/L): 5.5 mmol/L

## 2022-03-31 LAB — TSH: TSH: 2.83 mIU/L (ref 0.40–4.50)

## 2022-04-14 ENCOUNTER — Ambulatory Visit (INDEPENDENT_AMBULATORY_CARE_PROVIDER_SITE_OTHER): Payer: 59

## 2022-04-14 ENCOUNTER — Other Ambulatory Visit (HOSPITAL_BASED_OUTPATIENT_CLINIC_OR_DEPARTMENT_OTHER): Payer: Self-pay

## 2022-04-14 ENCOUNTER — Ambulatory Visit: Payer: 59 | Admitting: Sports Medicine

## 2022-04-14 DIAGNOSIS — S46211A Strain of muscle, fascia and tendon of other parts of biceps, right arm, initial encounter: Secondary | ICD-10-CM

## 2022-04-14 DIAGNOSIS — S4991XA Unspecified injury of right shoulder and upper arm, initial encounter: Secondary | ICD-10-CM | POA: Diagnosis not present

## 2022-04-14 DIAGNOSIS — M19011 Primary osteoarthritis, right shoulder: Secondary | ICD-10-CM | POA: Diagnosis not present

## 2022-04-14 MED ORDER — MELOXICAM 15 MG PO TABS
ORAL_TABLET | ORAL | 3 refills | Status: AC
  Filled 2022-04-14: qty 90, 90d supply, fill #0

## 2022-04-14 NOTE — Progress Notes (Signed)
    Procedures performed today:    None.  Independent interpretation of notes and tests performed by another provider:   None.  Brief History, Exam, Impression, and Recommendations:    Traumatic partial tear of right biceps tendon This is a pleasant 41 year old male, several days ago he was working on his jeep with his right arm fully abducted, he went to twist off the oil drain plug, he felt a tearing sensation anterior proximal shoulder. Afterward he had significant pain and difficulty using his shoulder and elbow. He was also concerned about a potential deformity in his biceps muscle belly. On exam he does not have much tenderness to palpation, he does have some weakness to supination of the right elbow, good strength to flexion. Very minimal if any Popeye sign on the right. An unofficial ultrasound did show what appeared to be at least partial tearing of the proximal biceps at the level of the bicipital groove. We discussed the anatomy and pathophysiology, due to the weakness and potential for full thickness retracted tear we will proceed with x-rays and an MRI. I did give him some biceps rehab.    ____________________________________________ Gwen Her. Dianah Field, M.D., ABFM., CAQSM., AME. Primary Care and Sports Medicine [place] [facility]  Adjunct Professor of [place] of [facility] of Medicine  Risk manager

## 2022-04-14 NOTE — Assessment & Plan Note (Signed)
This is a pleasant 41 year old male, several days ago he was working on his jeep with his right arm fully abducted, he went to twist off the oil drain plug, he felt a tearing sensation anterior proximal shoulder. Afterward he had significant pain and difficulty using his shoulder and elbow. He was also concerned about a potential deformity in his biceps muscle belly. On exam he does not have much tenderness to palpation, he does have some weakness to supination of the right elbow, good strength to flexion. Very minimal if any Popeye sign on the right. An unofficial ultrasound did show what appeared to be at least partial tearing of the proximal biceps at the level of the bicipital groove. We discussed the anatomy and pathophysiology, due to the weakness and potential for full thickness retracted tear we will proceed with x-rays and an MRI. I did give him some biceps rehab.

## 2022-04-20 ENCOUNTER — Ambulatory Visit (INDEPENDENT_AMBULATORY_CARE_PROVIDER_SITE_OTHER): Payer: 59

## 2022-04-20 DIAGNOSIS — S46211A Strain of muscle, fascia and tendon of other parts of biceps, right arm, initial encounter: Secondary | ICD-10-CM | POA: Diagnosis not present

## 2022-04-20 DIAGNOSIS — M7551 Bursitis of right shoulder: Secondary | ICD-10-CM | POA: Diagnosis not present

## 2022-04-20 DIAGNOSIS — S46011A Strain of muscle(s) and tendon(s) of the rotator cuff of right shoulder, initial encounter: Secondary | ICD-10-CM | POA: Diagnosis not present

## 2022-04-23 ENCOUNTER — Other Ambulatory Visit (HOSPITAL_BASED_OUTPATIENT_CLINIC_OR_DEPARTMENT_OTHER): Payer: Self-pay

## 2022-05-04 ENCOUNTER — Ambulatory Visit (INDEPENDENT_AMBULATORY_CARE_PROVIDER_SITE_OTHER): Payer: 59

## 2022-05-04 ENCOUNTER — Ambulatory Visit (INDEPENDENT_AMBULATORY_CARE_PROVIDER_SITE_OTHER): Payer: 59 | Admitting: Sports Medicine

## 2022-05-04 DIAGNOSIS — M2392 Unspecified internal derangement of left knee: Secondary | ICD-10-CM | POA: Diagnosis not present

## 2022-05-04 DIAGNOSIS — M25462 Effusion, left knee: Secondary | ICD-10-CM | POA: Diagnosis not present

## 2022-05-04 NOTE — Assessment & Plan Note (Signed)
History of chondromalacia status post arthroscopy, loose body excision on the right knee, he has had to have the joint unlocked in the past, now having recurrence of locking of the joint with about 15 to 20 degrees of extension lag and pain laterally, we did a lidocaine injection and then gently unlocked the joint. He prefers to watch this for now, but if it happens again we would need advanced imaging and referral back to Dr. Griffin Basil.

## 2022-05-04 NOTE — Progress Notes (Signed)
    Procedures performed today:    Procedure: Real-time Ultrasound Guided injection of the left knee Device: Samsung HS60  Verbal informed consent obtained.  Time-out conducted.  Noted no overlying erythema, induration, or other signs of local infection.  Skin prepped in a sterile fashion.  Local anesthesia: Topical Ethyl chloride.  With sterile technique and under real time ultrasound guidance:  No effusion noted, 3 cc lidocaine, 3 cc bupivacaine injected easily.   Completed without difficulty  Advised to call if fevers/chills, erythema, induration, drainage, or persistent bleeding.  Images permanently stored and available for review in PACS.  Impression: Technically successful ultrasound guided injection.  Independent interpretation of notes and tests performed by another provider:   None.  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 He prefers to watch this for now, but if it happens again we would need advanced imaging and referral back to Dr. Griffin Basil.    ____________________________________________ Gwen Her. Dianah Field, M.D., ABFM., CAQSM., AME. Primary Care and Sports Medicine [place] [facility]  Adjunct Professor of [place] of [facility] of Medicine  Risk manager

## 2022-05-07 ENCOUNTER — Other Ambulatory Visit: Payer: Self-pay | Admitting: Orthopaedic Surgery

## 2022-05-07 DIAGNOSIS — M25562 Pain in left knee: Secondary | ICD-10-CM | POA: Diagnosis not present

## 2022-05-07 DIAGNOSIS — M2342 Loose body in knee, left knee: Secondary | ICD-10-CM

## 2022-05-16 ENCOUNTER — Other Ambulatory Visit: Payer: 59

## 2022-05-17 ENCOUNTER — Ambulatory Visit (INDEPENDENT_AMBULATORY_CARE_PROVIDER_SITE_OTHER): Payer: 59

## 2022-05-17 DIAGNOSIS — M2392 Unspecified internal derangement of left knee: Secondary | ICD-10-CM

## 2022-05-17 DIAGNOSIS — M2342 Loose body in knee, left knee: Secondary | ICD-10-CM

## 2022-05-17 DIAGNOSIS — M25562 Pain in left knee: Secondary | ICD-10-CM | POA: Diagnosis not present

## 2022-05-26 ENCOUNTER — Ambulatory Visit: Payer: Self-pay | Admitting: Sports Medicine

## 2022-05-28 NOTE — H&P (Signed)
PREOPERATIVE H&P  Chief Complaint: left knee loose body  HPI: Joshua Gaines is a 42 y.o. male who is scheduled for, Procedure(s): ARTHROSCOPY KNEE/LOOSE BODY EXCISION.   Patient has a past medical history significant for GERD, PONV.   Patient had a left knee revision ACL with autograft in 2021. His knee locked up a few weeks ago. He is frustrated by this and he wants to know what else can be done. He has been told he  has a loose body.  Symptoms are rated as moderate to severe, and have been worsening.  This is significantly impairing activities of daily living.    Please see clinic note for further details on this patient's care.    He has elected for surgical management.   Past Medical History:  Diagnosis Date   Anxiety    GERD (gastroesophageal reflux disease)    PONV (postoperative nausea and vomiting)    Past Surgical History:  Procedure Laterality Date   ANTERIOR CRUCIATE LIGAMENT REPAIR  [id number]   CHONDROPLASTY Right 02/01/2018   Procedure: CHONDROPLASTY;  Surgeon: Hiram Gash, MD;  Location: [place];  Service: Orthopedics;  Laterality: Right;   CHONDROPLASTY Right 02/07/2020   Procedure: CHONDROPLASTY;  Surgeon: Hiram Gash, MD;  Location: [place];  Service: Orthopedics;  Laterality: Right;   ESOPHAGOGASTRODUODENOSCOPY     2019-2020 at [facility] Dr Watt Climes was put on a PPI   HARDWARE REMOVAL Right 02/01/2018   Procedure: HARDWARE REMOVAL;  Surgeon: Hiram Gash, MD;  Location: [place];  Service: Orthopedics;  Laterality: Right;   HARDWARE REMOVAL Left 06/11/2019   Procedure: HARDWARE REMOVAL;  Surgeon: Hiram Gash, MD;  Location: [place];  Service: Orthopedics;  Laterality: Left;   HERNIA REPAIR  aprox 2010 or 2013   right inguinal    KNEE ARTHROSCOPY Right 03/20/2020   Procedure: ARTHROSCOPY KNEE LOOSE BODY EXCISION;  Surgeon: Hiram Gash, MD;  Location: [place];   Service: Orthopedics;  Laterality: Right;   KNEE ARTHROSCOPY WITH ANTERIOR CRUCIATE LIGAMENT (ACL) REPAIR Left 06/11/2019   Procedure: KNEE ARTHROSCOPY WITH ANTERIOR CRUCIATE LIGAMENT (ACL) REPAIR WITH HAMSTRING AUTOGRAFT;  Surgeon: Hiram Gash, MD;  Location: [place];  Service: Orthopedics;  Laterality: Left;   KNEE ARTHROSCOPY WITH ANTERIOR CRUCIATE LIGAMENT (ACL) REPAIR WITH HAMSTRING GRAFT Right 02/01/2018   Procedure: RIGHT KNEE ARTHROSCOPY WITH POSSIBLE REVISION ANTERIOR CRUCIATE LIGAMENT (ACL) REPAIR WITH AUTOGRAFT HAMSTRING;  Surgeon: Hiram Gash, MD;  Location: [place];  Service: Orthopedics;  Laterality: Right;   KNEE ARTHROSCOPY WITH LATERAL MENISECTOMY Right 02/01/2018   Procedure: RIGHT KNEE ARTHROSCOPY WITH LATERAL MENISECTOMY;  Surgeon: Hiram Gash, MD;  Location: [place];  Service: Orthopedics;  Laterality: Right;   KNEE ARTHROSCOPY WITH LATERAL MENISECTOMY Right 02/07/2020   Procedure: ARTHROSCOPY KNEE REMOVAL OF LOOSE FOREIGN BODY, PARTIAL LATERAL MENISECTOMY AND DEBRIDEMENT/SHAVING CHONDROPLASTY;  Surgeon: Hiram Gash, MD;  Location: [place];  Service: Orthopedics;  Laterality: Right;   KNEE ARTHROSCOPY WITH MEDIAL MENISECTOMY Right 02/01/2018   Procedure: RIGHT KNEE ARTHROSCOPY WITH MEDIAL MENISECTOMY, CHONDROPLASTY;  Surgeon: Hiram Gash, MD;  Location: [place];  Service: Orthopedics;  Laterality: Right;   MENISCUS REPAIR     left knee   TENDON REPAIR Right 05/01/2020   Procedure: EXTENSOR HALLUCIS LONGUS AND BREVIS TENDON REPAIR;  Surgeon: Erle Crocker, MD;  [facility];  Service: Orthopedics;  Laterality: Right;   WOUND EXPLORATION Right 05/01/2020   Procedure: WOUND EXPLORATION OF RIGHT FOOT;  Surgeon: Erle Crocker, MD;  Location: [place];  Service: Orthopedics;  Laterality: Right;   Social History   Socioeconomic  History   Marital status: Married    Spouse name: Not on file   Number of children: Not on file   Years of education: Not on file   Highest education level: Not on file  Occupational History   Not on file  Tobacco Use   Smoking status: Never   Smokeless tobacco: Never  Vaping Use   Vaping Use: Never used  Substance and Sexual Activity   Alcohol use: No   Drug use: No   Sexual activity: Yes    Partners: Female  Other Topics Concern   Not on file  Social History Narrative   Not on file   Social Determinants of Health   Financial Resource Strain: Not on file  Food Insecurity: Not on file  Transportation Needs: Not on file  Physical Activity: Not on file  Stress: Not on file  Social Connections: Not on file   Family History  Problem Relation Age of Onset   Colon cancer Maternal Uncle    Colon cancer Maternal Grandfather    Esophageal cancer Neg Hx    Rectal cancer Neg Hx    Stomach cancer Neg Hx    No Known Allergies Prior to Admission medications   Medication Sig Start Date End Date Taking? Authorizing Provider  meloxicam (MOBIC) 15 MG tablet Take 1 tablet by mouth everyday with a meal for 2 weeks, then take 1 tablet daily as needed for pain 04/14/22   Silverio Decamp, MD  Sodium Fluoride (PREVIDENT 5000 BOOSTER PLUS) 1.1 % PSTE Use daily in place of regular toothpaste 10/20/21     omeprazole (PRILOSEC) 20 MG capsule Take 1 capsule (20 mg total) by mouth daily. 30 days for gastroprotection while taking NSAIDs. 03/20/20 04/28/20  McBaneMaylene Roes, PA-C    ROS: All other systems have been reviewed and were otherwise negative with the exception of those mentioned in the HPI and as above.  Physical Exam: General: Alert, no acute distress Cardiovascular: No pedal edema Respiratory: No cyanosis, no use of accessory musculature GI: No organomegaly, abdomen is soft and non-tender Skin: No lesions in the area of chief complaint Neurologic: Sensation intact  distally Psychiatric: Patient is competent for consent with normal mood and affect Lymphatic: No axillary or cervical lymphadenopathy  MUSCULOSKELETAL:  The left knee demonstrates a full range of motion. Good endpoint with [place]. There is a 1-2 mm translation.  Imaging: Imaging of the left knee reviewed  in [place] shows an obvious osteochondral loose body within the joint  Assessment: left knee loose body  Plan: Plan for Procedure(s): ARTHROSCOPY KNEE/LOOSE BODY EXCISION  The risks benefits and alternatives were discussed with the patient including but not limited to the risks of nonoperative treatment, versus surgical intervention including infection, bleeding, nerve injury,  blood clots, cardiopulmonary complications, morbidity, mortality, among others, and they were willing to proceed.   The patient acknowledged the explanation, agreed to proceed with the plan and consent was signed.   Operative Plan: Left knee arthroscopy with loose body excision Discharge Medications: standard DVT Prophylaxis: aspirin Physical Therapy: outpatient PT Special Discharge needs: +/-   Ethelda Chick, PA-C  05/28/2022 8:49 AM

## 2022-06-03 ENCOUNTER — Encounter (HOSPITAL_BASED_OUTPATIENT_CLINIC_OR_DEPARTMENT_OTHER): Payer: Self-pay

## 2022-06-03 ENCOUNTER — Ambulatory Visit (HOSPITAL_BASED_OUTPATIENT_CLINIC_OR_DEPARTMENT_OTHER): Admit: 2022-06-03 | Payer: 59 | Admitting: Orthopaedic Surgery

## 2022-06-03 SURGERY — ARTHROSCOPY, KNEE
Anesthesia: General | Site: Knee | Laterality: Left

## 2022-06-21 ENCOUNTER — Other Ambulatory Visit: Payer: Self-pay

## 2023-04-06 ENCOUNTER — Other Ambulatory Visit (INDEPENDENT_AMBULATORY_CARE_PROVIDER_SITE_OTHER): Payer: Managed Care, Other (non HMO)

## 2023-04-06 ENCOUNTER — Encounter: Payer: Self-pay | Admitting: Sports Medicine

## 2023-04-06 ENCOUNTER — Ambulatory Visit: Payer: Managed Care, Other (non HMO) | Admitting: Sports Medicine

## 2023-04-06 DIAGNOSIS — M2392 Unspecified internal derangement of left knee: Secondary | ICD-10-CM

## 2023-04-06 NOTE — Progress Notes (Addendum)
    Procedures performed today:    Procedure: Real-time Ultrasound Guided injection of the left knee Device: Samsung HS60  Verbal informed consent obtained.  Time-out conducted.  Noted no overlying erythema, induration, or other signs of local infection.  Skin prepped in a sterile fashion.  Local anesthesia: Topical Ethyl chloride.  With sterile technique and under real time ultrasound guidance:  No effusion noted, 3 cc lidocaine, 3 cc bupivacaine injected easily.   Completed without difficulty  Advised to call if fevers/chills, erythema, induration, drainage, or persistent bleeding.  Images permanently stored and available for review in PACS.  Impression: Technically successful ultrasound guided injection.  Independent interpretation of notes and tests performed by another provider:   None.  Brief History, Exam, Impression, and Recommendations:    Locked knee, left Pleasant 42 year old male with a history of chondromalacia status post arthroscopy, loose body excision in the right knee, he has occasionally had to have me unlock his left knee. We last did this in January 2024. He does have an MRI from earlier this year that does again show his partial medial meniscectomy as well as residual tearing of the remaining posterior horn that is likely the cause of his intermittent locking. No intra-articular loose body seen. Today we did this again, lidocaine, bupivacaine, I applied fairly firm varus stress and then extended and flexed the knee until I was able to pop it open, patient now reports good motion. We have had discussions again about referral back to Dr. Everardo Pacific for discussion of addressing this arthroscopically, but [name] does prefer to watch this and occasionally unlock it once every year or 2.    ____________________________________________ Ihor Austin. Benjamin Stain, M.D., ABFM., CAQSM., AME. Primary Care and Sports Medicine [place] [facility]  Adjunct Professor  of Family Medicine  [place] of [facility] of Medicine  Restaurant manager, fast food

## 2023-04-06 NOTE — Assessment & Plan Note (Addendum)
Pleasant 42 year old male with a history of chondromalacia status post arthroscopy, loose body excision in the right knee, he has occasionally had to have me unlock his left knee. We last did this in January 2024. He does have an MRI from earlier this year that does again show his partial medial meniscectomy as well as residual tearing of the remaining posterior horn that is likely the cause of his intermittent locking. No intra-articular loose body seen. Today we did this again, lidocaine, bupivacaine, I applied fairly firm varus stress and then extended and flexed the knee until I was able to pop it open, patient now reports good motion. We have had discussions again about referral back to Dr. Everardo Pacific for discussion of addressing this arthroscopically, but Joshua Gaines does prefer to watch this and occasionally unlock it once every year or 2.

## 2024-01-03 ENCOUNTER — Encounter: Payer: Self-pay | Admitting: Sports Medicine
